# Patient Record
Sex: Female | Born: 1965 | Race: White | Hispanic: No | Marital: Married | Smoking: Never smoker
Health system: Southern US, Community
[De-identification: ages and names within clinical notes are randomized; demographics above are authoritative.]

## PROBLEM LIST (undated history)

## (undated) DIAGNOSIS — R1013 Epigastric pain: Secondary | ICD-10-CM

## (undated) DIAGNOSIS — I10 Essential (primary) hypertension: Secondary | ICD-10-CM

## (undated) DIAGNOSIS — F411 Generalized anxiety disorder: Secondary | ICD-10-CM

## (undated) DIAGNOSIS — K219 Gastro-esophageal reflux disease without esophagitis: Secondary | ICD-10-CM

## (undated) DIAGNOSIS — R0789 Other chest pain: Secondary | ICD-10-CM

## (undated) HISTORY — DX: Generalized anxiety disorder: F41.1

## (undated) HISTORY — PX: ESOPHAGOGASTRODUODENOSCOPY: SHX1529

## (undated) HISTORY — PX: ABDOMINAL HYSTERECTOMY: SHX81

---

## 2005-05-16 HISTORY — PX: NISSEN FUNDOPLICATION: SHX2091

## 2007-09-23 DIAGNOSIS — K219 Gastro-esophageal reflux disease without esophagitis: Secondary | ICD-10-CM | POA: Insufficient documentation

## 2009-09-24 ENCOUNTER — Ambulatory Visit: Payer: Self-pay | Admitting: Family Medicine

## 2010-04-28 ENCOUNTER — Ambulatory Visit: Payer: Self-pay | Admitting: Family Medicine

## 2013-01-29 LAB — BASIC METABOLIC PANEL
BUN: 17 mg/dL (ref 4–21)
Creatinine: 0.8 mg/dL (ref 0.5–1.1)
Glucose: 84 mg/dL
POTASSIUM: 4.6 mmol/L (ref 3.4–5.3)
Sodium: 143 mmol/L (ref 137–147)

## 2013-01-29 LAB — TSH: TSH: 7.05 u[IU]/mL — AB (ref 0.41–5.90)

## 2013-01-29 LAB — CBC AND DIFFERENTIAL
HEMATOCRIT: 42 % (ref 36–46)
HEMOGLOBIN: 13.8 g/dL (ref 12.0–16.0)
NEUTROS ABS: 2 /uL
PLATELETS: 236 10*3/uL (ref 150–399)
WBC: 4.9 10*3/mL

## 2013-01-29 LAB — LIPID PANEL
CHOLESTEROL: 161 mg/dL (ref 0–200)
HDL: 64 mg/dL (ref 35–70)
LDL Cholesterol: 85 mg/dL
LDl/HDL Ratio: 1.3
TRIGLYCERIDES: 62 mg/dL (ref 40–160)

## 2013-01-29 LAB — HEPATIC FUNCTION PANEL
ALK PHOS: 93 U/L (ref 25–125)
ALT: 51 U/L — AB (ref 7–35)
AST: 38 U/L — AB (ref 13–35)
Bilirubin, Total: 0.5 mg/dL

## 2014-12-29 ENCOUNTER — Telehealth: Payer: Self-pay

## 2014-12-29 NOTE — Telephone Encounter (Signed)
Patient reports that her BP was at 127/93 yesterday, pt checked at [facility] while shopping. Patient reports that last week she had been having headaches and felt tired all week. Patient reports that today she is feeling better and does not need to be seen today. Patient denies shortness or breath, swelling. I advised pt to continue monitoring BP to try to get a baseline reading, advised to check in the morning. I also advised pt to keep well hydrated and low salt diet. I advised pt to call if BP continues to run high or if symptoms worsen. Patient voiced understanding.

## 2015-03-20 ENCOUNTER — Encounter: Payer: Self-pay | Admitting: Family Medicine

## 2015-03-20 ENCOUNTER — Ambulatory Visit (INDEPENDENT_AMBULATORY_CARE_PROVIDER_SITE_OTHER): Payer: BC Managed Care – PPO | Admitting: Family Medicine

## 2015-03-20 VITALS — BP 162/98 | HR 66 | Temp 97.9°F | Resp 14 | Ht 64.0 in | Wt 138.0 lb

## 2015-03-20 DIAGNOSIS — J069 Acute upper respiratory infection, unspecified: Secondary | ICD-10-CM | POA: Diagnosis not present

## 2015-03-20 DIAGNOSIS — I10 Essential (primary) hypertension: Secondary | ICD-10-CM

## 2015-03-20 DIAGNOSIS — K449 Diaphragmatic hernia without obstruction or gangrene: Secondary | ICD-10-CM | POA: Insufficient documentation

## 2015-03-20 DIAGNOSIS — R748 Abnormal levels of other serum enzymes: Secondary | ICD-10-CM | POA: Insufficient documentation

## 2015-03-20 DIAGNOSIS — F411 Generalized anxiety disorder: Secondary | ICD-10-CM | POA: Insufficient documentation

## 2015-03-20 DIAGNOSIS — J3089 Other allergic rhinitis: Secondary | ICD-10-CM | POA: Diagnosis not present

## 2015-03-20 DIAGNOSIS — J309 Allergic rhinitis, unspecified: Secondary | ICD-10-CM | POA: Insufficient documentation

## 2015-03-20 DIAGNOSIS — B001 Herpesviral vesicular dermatitis: Secondary | ICD-10-CM | POA: Insufficient documentation

## 2015-03-20 DIAGNOSIS — J0100 Acute maxillary sinusitis, unspecified: Secondary | ICD-10-CM

## 2015-03-20 DIAGNOSIS — F419 Anxiety disorder, unspecified: Secondary | ICD-10-CM | POA: Insufficient documentation

## 2015-03-20 DIAGNOSIS — E038 Other specified hypothyroidism: Secondary | ICD-10-CM | POA: Diagnosis not present

## 2015-03-20 DIAGNOSIS — E039 Hypothyroidism, unspecified: Secondary | ICD-10-CM

## 2015-03-20 MED ORDER — AZITHROMYCIN 250 MG PO TABS: ORAL_TABLET | ORAL | Status: DC

## 2015-03-20 MED ORDER — HYDROCHLOROTHIAZIDE 12.5 MG PO CAPS: 12.5000 mg | ORAL_CAPSULE | Freq: Every day | ORAL | Status: DC

## 2015-03-20 MED ORDER — FLUTICASONE PROPIONATE 50 MCG/ACT NA SUSP: 2.0000 | Freq: Every day | NASAL | Status: DC

## 2015-03-20 NOTE — Progress Notes (Signed)
Subjective:     Patient ID: Tami Williams, female   DOB: [birth year], 49 y.o.   MRN: [id number]  URI  This is a recurrent problem. The current episode started in the past 7 days (5 days ago). The problem has been gradually worsening. There has been no fever. Associated symptoms include chest pain (left, pt thinks may be due to reflux, not associated with cough), congestion, coughing (thick phlegm, did not see it, but nonbloody), headaches, nausea, a plugged ear sensation, rhinorrhea (yellow drainage), sinus pain (maxillary, frontal T zone) and sneezing. Pertinent negatives include no diarrhea, dysuria, ear pain (ears feel "stopped up", no pain), joint pain, rash, sore throat (trouble swallowing feels stuck, no pain), swollen glands, vomiting or wheezing. She has tried decongestant (Flonase all week helped runny nose. Tried Dayquil yesterday but stopped because felt it increases BP.) for the symptoms. The treatment provided moderate relief.   Pt has sinus infections every year around this time. In the past given cough meds, Flonase, Z-Pak which resolve sx in the past.   Also, has not been here in a while secondary to loss of insurance. Thinks her blood pressure has been running up.   Was previously on medication before weight loss.    Patient Active Problem List   Diagnosis Date Noted  . Upper respiratory infection 03/20/2015  No family history on file. Social History   Social History  . Marital Status: Married    Spouse Name: N/A  . Number of Children: N/A  . Years of Education: N/A   Occupational History  . Not on file.   Social History Main Topics  . Smoking status: Not on file  . Smokeless tobacco: Not on file  . Alcohol Use: Not on file  . Drug Use: Not on file  . Sexual Activity: Not on file   Other Topics Concern  . Not on file   Social History Narrative  . No narrative on file   No past surgical history on file. No Known Allergies Previous Medications   FLUTICASONE  (FLONASE) 50 MCG/ACT NASAL SPRAY    Place 2 sprays into both nostrils daily.   BP 162/98 mmHg  Pulse 66  Temp(Src) 97.9 F (36.6 C)  Resp 14  Ht 5\' 4"  (1.626 m)  Wt 138 lb (62.596 kg)  BMI 23.68 kg/m2  SpO2 97%   Review of Systems  Constitutional: Negative for fever, chills, activity change and appetite change.  HENT: Positive for congestion, rhinorrhea (yellow drainage), sinus pressure, sneezing and trouble swallowing. Negative for ear discharge, ear pain (ears feel "stopped up", no pain) and sore throat (trouble swallowing feels stuck, no pain).   Eyes: Positive for discharge and itching.  Respiratory: Positive for cough (thick phlegm, did not see it, but nonbloody). Negative for chest tightness, shortness of breath and wheezing.   Cardiovascular: Positive for chest pain (left, pt thinks may be due to reflux, not associated with cough).  Gastrointestinal: Positive for nausea. Negative for vomiting, diarrhea and constipation.  Endocrine: Negative.   Genitourinary: Negative.  Negative for dysuria.  Musculoskeletal: Negative.  Negative for myalgias and joint pain.  Skin: Negative for rash.  Neurological: Positive for headaches. Negative for weakness.  Hematological: Negative for adenopathy.  Psychiatric/Behavioral: Negative.        Objective:   Physical Exam  Constitutional: She appears well-developed and well-nourished. No distress.  HENT:  Head: Normocephalic and atraumatic.  Right Ear: External ear normal.  Left Ear: External ear normal.  Mouth/Throat: No oropharyngeal  exudate.  Normal TM bilaterally, mild nasal turbinate swelling, no maxillary or frontal sinus pain with palpation  Eyes: Conjunctivae and EOM are normal. Right eye exhibits no discharge. Left eye exhibits no discharge.  Neck: Normal range of motion. Neck supple.  Cardiovascular: Normal rate, regular rhythm and normal heart sounds.  Exam reveals no gallop and no friction rub.   No murmur  heard. Pulmonary/Chest: Effort normal and breath sounds normal. She has no wheezes.  Abdominal: Soft. Bowel sounds are normal. She exhibits no distension and no mass. There is no tenderness. There is no rebound and no guarding.  Musculoskeletal: Normal range of motion. She exhibits no edema or tenderness.  Lymphadenopathy:    She has no cervical adenopathy.  Neurological: She is alert. No cranial nerve deficit. She exhibits normal muscle tone.  Skin: Skin is warm and dry. She is not diaphoretic.  Psychiatric: She has a normal mood and affect.   BP 162/98 mmHg  Pulse 66  Temp(Src) 97.9 F (36.6 C)  Resp 14  Ht  (1.626 m)  Wt 138 lb (62.596 kg)  BMI 23.68 kg/m2  SpO2 97%     Assessment:     As below.     Plan:     1. Upper respiratory infection Initial cause of congestion.     2. Subclinical hypothyroidism Recheck labs.  - TSH  3. Abnormal liver enzymes Recheck  Labs. Has gained some weight since last visit.   - Comprehensive metabolic panel  4. Essential hypertension Recurrent problem.  Check labs and restart medication. Decrease salt and work on diet and exercise for weight loss.  - CBC with Differential/Platelet - hydrochlorothiazide (MICROZIDE) 12.5 MG capsule; Take 1 capsule (12.5 mg total) by mouth daily.  Dispense: 30 capsule; Refill: 5  5. Acute maxillary sinusitis, recurrence not specified New problem.  Start medication.  - azithromycin (ZITHROMAX) 250 MG tablet; 2 today and then one a day for 4 more days.  Dispense: 6 tablet; Refill: 0  6. Other allergic rhinitis Condition is worsening. Will start medication for better control.   - fluticasone (FLONASE) 50 MCG/ACT nasal spray; Place 2 sprays into both nostrils daily.  Dispense: 16 g; Refill: 11   Lorie Phenix, MD

## 2015-03-21 ENCOUNTER — Encounter: Payer: Self-pay | Admitting: Family Medicine

## 2015-03-21 LAB — COMPREHENSIVE METABOLIC PANEL
ALBUMIN: 4.8 g/dL (ref 3.5–5.5)
ALT: 84 IU/L — ABNORMAL HIGH (ref 0–32)
AST: 61 IU/L — ABNORMAL HIGH (ref 0–40)
Albumin/Globulin Ratio: 1.8 (ref 1.1–2.5)
Alkaline Phosphatase: 113 IU/L (ref 39–117)
BUN / CREAT RATIO: 15 (ref 9–23)
BUN: 12 mg/dL (ref 6–24)
Bilirubin Total: 0.3 mg/dL (ref 0.0–1.2)
CO2: 25 mmol/L (ref 18–29)
CREATININE: 0.8 mg/dL (ref 0.57–1.00)
Calcium: 9.9 mg/dL (ref 8.7–10.2)
Chloride: 100 mmol/L (ref 97–106)
GFR calc non Af Amer: 88 mL/min/{1.73_m2} (ref >59)
GFR, EST AFRICAN AMERICAN: 102 mL/min/{1.73_m2} (ref >59)
GLUCOSE: 85 mg/dL (ref 65–99)
Globulin, Total: 2.6 g/dL (ref 1.5–4.5)
Potassium: 4.7 mmol/L (ref 3.5–5.2)
Sodium: 142 mmol/L (ref 136–144)
TOTAL PROTEIN: 7.4 g/dL (ref 6.0–8.5)

## 2015-03-21 LAB — CBC WITH DIFFERENTIAL/PLATELET
BASOS ABS: 0 10*3/uL (ref 0.0–0.2)
Basos: 0 %
EOS (ABSOLUTE): 0.3 10*3/uL (ref 0.0–0.4)
Eos: 3 %
HEMOGLOBIN: 14.3 g/dL (ref 11.1–15.9)
Hematocrit: 43 % (ref 34.0–46.6)
IMMATURE GRANS (ABS): 0 10*3/uL (ref 0.0–0.1)
Immature Granulocytes: 0 %
LYMPHS: 28 %
Lymphocytes Absolute: 2.4 10*3/uL (ref 0.7–3.1)
MCH: 31.3 pg (ref 26.6–33.0)
MCHC: 33.3 g/dL (ref 31.5–35.7)
MCV: 94 fL (ref 79–97)
MONOCYTES: 9 %
Monocytes Absolute: 0.7 10*3/uL (ref 0.1–0.9)
Neutrophils Absolute: 5.1 10*3/uL (ref 1.4–7.0)
Neutrophils: 60 %
Platelets: 274 10*3/uL (ref 150–379)
RBC: 4.57 x10E6/uL (ref 3.77–5.28)
RDW: 12.9 % (ref 12.3–15.4)
WBC: 8.6 10*3/uL (ref 3.4–10.8)

## 2015-03-21 LAB — TSH: TSH: 7.2 u[IU]/mL — AB (ref 0.450–4.500)

## 2015-03-23 ENCOUNTER — Telehealth: Payer: Self-pay

## 2015-03-23 DIAGNOSIS — R748 Abnormal levels of other serum enzymes: Secondary | ICD-10-CM

## 2015-03-23 NOTE — Telephone Encounter (Signed)
-----   Message from Lorie PhenixNancy Maloney, MD sent at 03/21/2015 10:22 AM EDT ----- Labs ok except liver enzymes are elevated. Suspect related to weight gain. Please clarify if patient has seen Gi for this. Did not see it in the records. IF not, needs GI referral. Also,  Thyroid still borderline low.  Thanks- Harriett SineNancy

## 2015-03-23 NOTE — Telephone Encounter (Signed)
Advised pt of lab results. Pt verbally acknowledges understanding. Has not seen GI, [facility] Pt reports she is experiencing anxiety/ palpitations, fatigue, and cold intolerance. Is thyroid low enough to start medication? Allene DillonEmily Drozdowski, CMA

## 2015-03-24 NOTE — Telephone Encounter (Signed)
Would refer to GI for elevated liver enzymes first and recheck TSH and T4 in 4 weeks. Please notify patient and put in order. Thanks.

## 2015-03-24 NOTE — Telephone Encounter (Signed)
Pt advised.  She already has a GI appointment set up for December 28th.  She will call back in the beginning for December to get a lab sheet.    Thanks,   -Vernona RiegerLaura

## 2015-03-27 ENCOUNTER — Telehealth: Payer: Self-pay | Admitting: Family Medicine

## 2015-03-27 DIAGNOSIS — E038 Other specified hypothyroidism: Secondary | ICD-10-CM

## 2015-03-27 DIAGNOSIS — E039 Hypothyroidism, unspecified: Secondary | ICD-10-CM

## 2015-03-27 NOTE — Telephone Encounter (Signed)
Ok to refer.

## 2015-03-27 NOTE — Telephone Encounter (Signed)
Pt is [facility] to see a specalist about her thyroid.  Fax #[phone].  CB#[phone]

## 2015-03-27 NOTE — Telephone Encounter (Signed)
Please clarify who this means. Thanks.

## 2015-03-27 NOTE — Telephone Encounter (Signed)
Pt would like to see Dr. Sharin GraveSam Morayati (Endocrinology) for her thyroid.  She states she could not make an appointment without a referral.   Thanks,   -Vernona RiegerLaura

## 2015-05-01 ENCOUNTER — Encounter: Payer: Self-pay | Admitting: Family Medicine

## 2015-05-15 ENCOUNTER — Encounter: Payer: Self-pay | Admitting: Family Medicine

## 2015-05-27 ENCOUNTER — Encounter: Payer: Self-pay | Admitting: Family Medicine

## 2015-06-02 ENCOUNTER — Encounter: Payer: Self-pay | Admitting: Family Medicine

## 2015-08-11 ENCOUNTER — Encounter: Payer: BC Managed Care – PPO | Admitting: Family Medicine

## 2015-08-13 ENCOUNTER — Encounter: Payer: Self-pay | Admitting: Physician Assistant

## 2015-08-13 ENCOUNTER — Ambulatory Visit (INDEPENDENT_AMBULATORY_CARE_PROVIDER_SITE_OTHER): Payer: BC Managed Care – PPO | Admitting: Physician Assistant

## 2015-08-13 VITALS — BP 120/90 | HR 68 | Temp 98.0°F | Resp 16 | Wt 128.2 lb

## 2015-08-13 DIAGNOSIS — N751 Abscess of Bartholin's gland: Secondary | ICD-10-CM

## 2015-08-13 MED ORDER — CEPHALEXIN 500 MG PO CAPS: 500.0000 mg | ORAL_CAPSULE | Freq: Two times a day (BID) | ORAL | Status: DC

## 2015-08-13 NOTE — Patient Instructions (Signed)
Bartholin Cyst or Abscess A Bartholin cyst is a fluid-filled sac that forms on a Bartholin gland. Bartholin glands are small glands that are located within the folds of skin (labia) along the sides of the lower opening of the vagina. These glands produce a fluid to moisten the outside of the vagina during sexual intercourse. A Bartholin cyst causes a bulge on the side of the vagina. A cyst that is not large or infected may not cause symptoms or problems. However, if the fluid within the cyst becomes infected, the cyst can turn into an abscess. An abscess may cause discomfort or pain. CAUSES A Bartholin cyst may develop when the duct of the gland becomes blocked. In many cases, the cause of this is not known. Various kinds of bacteria can cause the cyst to become infected and develop into an abscess. RISK FACTORS You may be at an increased risk of developing a Bartholin cyst or abscess if:  You are a woman of reproductive age.  You have a history of previous Bartholin cysts or abscesses.  You have diabetes.  You have a sexually transmitted disease (STD). SIGNS AND SYMPTOMS The severity of symptoms varies depending on the size of the cyst and whether it is infected. Symptoms may include:  A bulge or swelling near the lower opening of your vagina.  Discomfort or pain.  Redness.  Pain during sexual intercourse.  Pain when walking.  Fluid draining from the area. DIAGNOSIS Your health care provider may make a diagnosis based on your symptoms and a physical exam. He or she will look for swelling in your vaginal area. Blood tests may be done to check for infections. A sample of fluid from the cyst or abscess may also be taken to be tested in a lab. TREATMENT Small cysts that are not infected may not require any treatment. These often go away on their own. Yourhealth care provider will recommend hot baths and the use of warm compresses. These may also be part of the treatment for an abscess.  Treatment options for a large cyst or abscess may include:   Antibiotic medicine.  A surgical procedure to drain the abscess. One of the following procedures may be done:  Incision and drainage. An incision is made in the cyst or abscess so that the fluid drains out. A catheter may be placed inside the cyst so that it does not close and fill up with fluid again. The catheter will be removed after you have a follow-up visit with a specialist (gynecologist).  Marsupialization. The cyst or abscess is opened and kept open by stitching the edges of the skin to the walls of the cyst or abscess. This allows it to continue to drain and not fill up with fluid again. If you have cysts or abscesses that keep returning and have required incision and drainage multiple times, your health care provider may talk to you about surgery to remove the Bartholin gland. HOME CARE INSTRUCTIONS  Take medicines only as directed by your health care provider.  If you were prescribed an antibiotic medicine, finish it all even if you start to feel better.  Apply warm, wet compresses to the area or take warm, shallow baths that cover your pelvic region (sitz baths) several times a day or as directed by your health care provider.  Do not squeeze the cyst or apply heavy pressure to it.  Do not have sexual intercourse until the cyst has gone away.  If your cyst or abscess was   opened, a small piece of gauze or a drain may have been placed in the area to allow drainage. Do not remove the gauze or the drain until directed by your health care provider.  Wear feminine pads--not tampons--as needed for any drainage or bleeding.  Keep all follow-up visits as directed by your health care provider. This is important. PREVENTION Take these steps to help prevent a Bartholin cyst from returning:  Practice good hygiene.   Clean your vaginal area with mild soap and a soft cloth when you bathe.  Practice safe sex to prevent  STDs. SEEK MEDICAL CARE IF:  You have increased pain, swelling, or redness in the area of the cyst.  Puslike drainage is coming from the cyst.  You have a fever.   This information is not intended to replace advice given to you by your health care provider. Make sure you discuss any questions you have with your health care provider.   Document Released: 05/02/2005 Document Revised: 05/23/2014 Document Reviewed: 12/16/2013 Elsevier Interactive Patient Education 2016 Elsevier Inc.  

## 2015-08-13 NOTE — Progress Notes (Signed)
       Patient: Tami CopierLisa P Williams Female    DOB: 03/26/1966   50 y.o.   MRN: [id number] Visit Date: 08/13/2015  Today's Provider: Margaretann LovelessJennifer M Burnette, PA-C   Chief Complaint  Patient presents with  . bump on vaginal area   Subjective:    HPI Tami Williams is here today c/o a little bump on her vaginal area. She notice the bump on Sunday. She reports that if she touches it , it hurts. No discharge, no fever. She is sexually active but has been with only one partner since she was 50 years old. She states there is no infidelity.    No Known Allergies Previous Medications   AZITHROMYCIN (ZITHROMAX) 250 MG TABLET    2 today and then one a day for 4 more days.   FLUTICASONE (FLONASE) 50 MCG/ACT NASAL SPRAY    Place 2 sprays into both nostrils daily.   HYDROCHLOROTHIAZIDE (MICROZIDE) 12.5 MG CAPSULE    Take 1 capsule (12.5 mg total) by mouth daily.   SYNTHROID 25 MCG TABLET    TK 1 T PO QD    Review of Systems  Constitutional: Negative for fever and fatigue.  HENT: Negative.   Respiratory: Negative for cough, chest tightness, shortness of breath and wheezing.   Cardiovascular: Negative for chest pain, palpitations and leg swelling.  Genitourinary: Positive for genital sores. Negative for dysuria, urgency, frequency, flank pain, vaginal bleeding, vaginal discharge, vaginal pain, menstrual problem, pelvic pain and dyspareunia.  Musculoskeletal: Negative for back pain.    Social History  Substance Use Topics  . Smoking status: Never Smoker   . Smokeless tobacco: Not on file  . Alcohol Use: 0.0 oz/week    0 Standard drinks or equivalent per week     Comment: occasional   Objective:   BP 120/90 mmHg  Pulse 68  Temp(Src) 98 F (36.7 C) (Oral)  Resp 16  Wt 128 lb 3.2 oz (58.151 kg)  Physical Exam  Constitutional: She appears well-developed and well-nourished. No distress.  Cardiovascular: Normal rate, regular rhythm and normal heart sounds.  Exam reveals no gallop and no friction  rub.   No murmur heard. Pulmonary/Chest: Effort normal and breath sounds normal. No respiratory distress. She has no wheezes. She has no rales.  Genitourinary: Vagina normal.    There is no rash, tenderness, lesion or injury on the right labia. There is tenderness and lesion on the left labia. There is no rash or injury on the left labia.  Skin: She is not diaphoretic.  Vitals reviewed.       Assessment & Plan:     1. Bartholin's gland abscess Will treat with Keflex as below. Advised to use warm compresses over the area for discomfort. May also soak in warm bathtub with Epson salt for inflammation and discomfort. She is to call the office if symptoms do not improve or worsen. - cephALEXin (KEFLEX) 500 MG capsule; Take 1 capsule (500 mg total) by mouth 2 (two) times daily.  Dispense: 14 capsule; Refill: 0       Margaretann LovelessJennifer M Burnette, PA-C  [facility]

## 2015-09-09 ENCOUNTER — Ambulatory Visit (INDEPENDENT_AMBULATORY_CARE_PROVIDER_SITE_OTHER): Payer: BC Managed Care – PPO | Admitting: Family Medicine

## 2015-09-09 ENCOUNTER — Encounter: Payer: Self-pay | Admitting: Family Medicine

## 2015-09-09 VITALS — BP 110/80 | HR 76 | Temp 98.1°F | Resp 16 | Ht 64.0 in | Wt 127.0 lb

## 2015-09-09 DIAGNOSIS — Z1211 Encounter for screening for malignant neoplasm of colon: Secondary | ICD-10-CM

## 2015-09-09 DIAGNOSIS — F419 Anxiety disorder, unspecified: Secondary | ICD-10-CM

## 2015-09-09 DIAGNOSIS — I10 Essential (primary) hypertension: Secondary | ICD-10-CM

## 2015-09-09 DIAGNOSIS — J3089 Other allergic rhinitis: Secondary | ICD-10-CM

## 2015-09-09 DIAGNOSIS — Z1239 Encounter for other screening for malignant neoplasm of breast: Secondary | ICD-10-CM | POA: Diagnosis not present

## 2015-09-09 DIAGNOSIS — Z0000 Encounter for general adult medical examination without abnormal findings: Secondary | ICD-10-CM | POA: Diagnosis not present

## 2015-09-09 DIAGNOSIS — E038 Other specified hypothyroidism: Secondary | ICD-10-CM

## 2015-09-09 DIAGNOSIS — E039 Hypothyroidism, unspecified: Secondary | ICD-10-CM

## 2015-09-09 LAB — POCT URINALYSIS DIPSTICK
BILIRUBIN UA: NEGATIVE
Glucose, UA: NEGATIVE
KETONES UA: NEGATIVE
Leukocytes, UA: NEGATIVE
Nitrite, UA: NEGATIVE
PH UA: 6
Protein, UA: NEGATIVE
RBC UA: NEGATIVE
Spec Grav, UA: <=1.005
Urobilinogen, UA: 0.2

## 2015-09-09 LAB — IFOBT (OCCULT BLOOD): IFOBT: NEGATIVE

## 2015-09-09 MED ORDER — ESCITALOPRAM OXALATE 10 MG PO TABS: 10.0000 mg | ORAL_TABLET | Freq: Every day | ORAL | Status: DC

## 2015-09-09 NOTE — Progress Notes (Signed)
Patient ID: AYLEE LITTRELL, female   DOB: Oct 23, 1965, 50 y.o.   MRN: [id number]       Patient: Tami Williams, Female    DOB: 01-29-1966, 50 y.o.   MRN: [id number] Visit Date: 09/09/2015  Today's Provider: Lorie Phenix, MD   Chief Complaint  Patient presents with  . Annual Exam  . Anxiety  . URI   Subjective:    Annual physical exam Tami Williams is a 50 y.o. female who presents today for health maintenance and complete physical. She feels well. She reports exercising 6 days a week. She reports she is sleeping fairly well.  01/04/13 CPE 01/04/13 Pap-neg; HPV-neg ----------------------------------------------------------------- Anxiety: Patient complains of anxiety disorder.  She has the following symptoms: irritable, mood swings. Onset of symptoms was approximately several months ago, gradually worsening since that time. She denies current suicidal and homicidal ideation. Family history significant for depression.Possible organic causes contributing are: none. Risk factors: none Previous treatment includes none and none.  She complains of the following side effects from the treatment: none.  Upper Respiratory Infection: Patient complains of symptoms of a URI, possible sinusitis. Symptoms include congestion, cough and sore throat. Onset of symptoms was 1 week ago, unchanged since that time. She also c/o productive cough with  yellow colored sputum for the past 1 week .  She is drinking plenty of fluids. Evaluation to date: none. Treatment to date: decongestants.   Review of Systems  Constitutional: Positive for activity change.  HENT: Positive for congestion, rhinorrhea, sinus pressure, sneezing and trouble swallowing.   Eyes: Negative.   Respiratory: Positive for cough.   Cardiovascular: Negative.   Gastrointestinal: Negative.   Endocrine: Negative.   Genitourinary: Negative.   Musculoskeletal: Negative.   Skin: Negative.   Allergic/Immunologic: Negative.   Neurological:  Negative.   Hematological: Negative.   Psychiatric/Behavioral: Positive for sleep disturbance and agitation. The patient is nervous/anxious.     Social History      She  reports that she has never smoked. She has never used smokeless tobacco. She reports that she drinks alcohol. She reports that she does not use illicit drugs.       Social History   Social History  . Marital Status: Married    Spouse Name: N/A  . Number of Children: N/A  . Years of Education: N/A   Social History Main Topics  . Smoking status: Never Smoker   . Smokeless tobacco: Never Used  . Alcohol Use: 0.0 oz/week    0 Standard drinks or equivalent per week     Comment: occasional  . Drug Use: No  . Sexual Activity: Not Asked   Other Topics Concern  . None   Social History Narrative    History reviewed. No pertinent past medical history.   Patient Active Problem List   Diagnosis Date Noted  . Upper respiratory infection 03/20/2015  . Allergic rhinitis 03/20/2015  . Anxiety 03/20/2015  . Cold sore 03/20/2015  . Abnormal liver enzymes 03/20/2015  . 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 Hypertension 03/20/2015  . Diaphragmatic hernia 03/20/2015  . 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 Acid reflux 09/23/2007  . Gastro-esophageal reflux disease without esophagitis 09/23/2007    Past Surgical History  Procedure Laterality Date  . Nissen fundoplication  2007    dr. Katrinka Blazing  . Abdominal hysterectomy      due to endometriosis    Family History        Family Status  Relation  Status Death Age  . Mother Alive   . Father Alive         Her family history includes Hypertension in her father and mother.    No Known Allergies  Previous Medications   SYNTHROID 25 MCG TABLET    TK 1 T PO QD    Patient Care Team: Lorie PhenixNancy Jaidyn Kuhl, MD as PCP - General (Family Medicine)     Objective:   Vitals: BP 110/80 mmHg  Pulse 76  Temp(Src) 98.1 F (36.7 C)  (Oral)  Resp 16  Ht 5\' 4"  (1.626 m)  Wt 127 lb (57.607 kg)  BMI 21.79 kg/m2   Physical Exam  Constitutional: She is oriented to person, place, and time. She appears well-developed and well-nourished.  HENT:  Head: Normocephalic and atraumatic.  Right Ear: Tympanic membrane, external ear and ear canal normal.  Left Ear: Tympanic membrane, external ear and ear canal normal.  Nose: Nose normal.  Mouth/Throat: Uvula is midline, oropharynx is clear and moist and mucous membranes are normal.  Eyes: Conjunctivae, EOM and lids are normal. Pupils are equal, round, and reactive to light.  Neck: Trachea normal and normal range of motion. Neck supple. Carotid bruit is not present. No thyroid mass and no thyromegaly present.  Cardiovascular: Normal rate, regular rhythm and normal heart sounds.   Pulmonary/Chest: Effort normal and breath sounds normal.  Abdominal: Soft. Normal appearance and bowel sounds are normal. There is no hepatosplenomegaly. There is no tenderness.  Genitourinary: No breast swelling, tenderness or discharge.  Musculoskeletal: Normal range of motion.  Lymphadenopathy:    She has no cervical adenopathy.    She has no axillary adenopathy.  Neurological: She is alert and oriented to person, place, and time. She has normal strength. No cranial nerve deficit.  Skin: Skin is warm, dry and intact.  Psychiatric: She has a normal mood and affect. Her speech is normal and behavior is normal. Judgment and thought content normal. Cognition and memory are normal.    Depression Screen PHQ 2/9 Scores 09/09/2015  PHQ - 2 Score 0     Assessment & Plan:     Routine Health Maintenance and Physical Exam  Exercise Activities and Dietary recommendations Goals    None       There is no immunization history on file for this patient.      1. Annual physical exam Stable. Patient advised to continue eating healthy and exercise daily. - POCT urinalysis dipstick  2. Anxiety New  problem. Worsening. Patient started on escitalopram 10 mg as below.  Patient instructed to call back if condition worsens or does not improve.    - escitalopram (LEXAPRO) 10 MG tablet; Take 1 tablet (10 mg total) by mouth daily. Take 1/2 tablet for a week then increase to 1 tablet daily  Dispense: 30 tablet; Refill: 5  3. Breast cancer screening - MM DIGITAL SCREENING BILATERAL; Future  4. Colon cancer screening - IFOBT POC (occult bld, rslt in office)  5. Essential hypertension - Comprehensive metabolic panel - Lipid Panel With LDL/HDL Ratio  6. Other allergic rhinitis - CBC with Differential/Platelet  7. Subclinical hypothyroidism Stable. Patient advised to keep follow-up with endocrinology.   Patient seen and examined by Dr. Leo GrosserNancy J.. Amear Strojny, and note scribed by Liz BeachSulibeya S. Dimas, CMA.  I have reviewed the document for accuracy and completeness and I agree with above. Leo Grosser- Trayce Caravello J. Alayne Estrella, MD   Lorie PhenixNancy Keiry Kowal, MD   --------------------------------------------------------------------

## 2015-09-09 NOTE — Patient Instructions (Signed)
Please call the [facility] at [facility] to schedule this at [phone]   

## 2015-09-10 ENCOUNTER — Telehealth: Payer: Self-pay | Admitting: Family Medicine

## 2015-09-10 DIAGNOSIS — J329 Chronic sinusitis, unspecified: Secondary | ICD-10-CM | POA: Insufficient documentation

## 2015-09-10 DIAGNOSIS — J0100 Acute maxillary sinusitis, unspecified: Secondary | ICD-10-CM

## 2015-09-10 MED ORDER — AMOXICILLIN-POT CLAVULANATE 875-125 MG PO TABS: 1.0000 | ORAL_TABLET | Freq: Two times a day (BID) | ORAL | Status: DC

## 2015-09-10 NOTE — Telephone Encounter (Signed)
I did send in rx today. See below. Please notify patient. Thanks.

## 2015-09-10 NOTE — Telephone Encounter (Signed)
Was on the fence about treatment but send in rx if still with symptoms. Thanks.

## 2015-09-10 NOTE — Telephone Encounter (Signed)
Pt stated when she was in the office for her CPE 09/09/15 she was told that she has a sinus infection. Pt stated that [facility] [place] and she just wanted to make sure it wasn't overlooked or if Dr. Elease HashimotoMaloney just wanted her to try something OTC. Please advise. Thanks TNP

## 2015-09-10 NOTE — Telephone Encounter (Signed)
Informed pt. Tami Williams, CMA  

## 2015-09-16 ENCOUNTER — Telehealth: Payer: Self-pay

## 2015-09-16 LAB — COMPREHENSIVE METABOLIC PANEL
ALBUMIN: 4.6 g/dL (ref 3.5–5.5)
ALT: 22 IU/L (ref 0–32)
AST: 25 IU/L (ref 0–40)
Albumin/Globulin Ratio: 2.1 (ref 1.2–2.2)
Alkaline Phosphatase: 73 IU/L (ref 39–117)
BUN/Creatinine Ratio: 22 (ref 9–23)
BUN: 19 mg/dL (ref 6–24)
Bilirubin Total: 0.5 mg/dL (ref 0.0–1.2)
CALCIUM: 9.5 mg/dL (ref 8.7–10.2)
CO2: 26 mmol/L (ref 18–29)
Chloride: 98 mmol/L (ref 96–106)
Creatinine, Ser: 0.87 mg/dL (ref 0.57–1.00)
GFR, EST AFRICAN AMERICAN: 92 mL/min/{1.73_m2} (ref >59)
GFR, EST NON AFRICAN AMERICAN: 80 mL/min/{1.73_m2} (ref >59)
GLOBULIN, TOTAL: 2.2 g/dL (ref 1.5–4.5)
Glucose: 84 mg/dL (ref 65–99)
Potassium: 5 mmol/L (ref 3.5–5.2)
Sodium: 140 mmol/L (ref 134–144)
TOTAL PROTEIN: 6.8 g/dL (ref 6.0–8.5)

## 2015-09-16 LAB — CBC WITH DIFFERENTIAL/PLATELET
BASOS: 0 %
Basophils Absolute: 0 10*3/uL (ref 0.0–0.2)
EOS (ABSOLUTE): 0.1 10*3/uL (ref 0.0–0.4)
EOS: 2 %
HEMATOCRIT: 42.8 % (ref 34.0–46.6)
HEMOGLOBIN: 14.1 g/dL (ref 11.1–15.9)
IMMATURE GRANS (ABS): 0 10*3/uL (ref 0.0–0.1)
IMMATURE GRANULOCYTES: 0 %
LYMPHS: 35 %
Lymphocytes Absolute: 1.9 10*3/uL (ref 0.7–3.1)
MCH: 30.5 pg (ref 26.6–33.0)
MCHC: 32.9 g/dL (ref 31.5–35.7)
MCV: 93 fL (ref 79–97)
MONOCYTES: 9 %
Monocytes Absolute: 0.5 10*3/uL (ref 0.1–0.9)
NEUTROS PCT: 54 %
Neutrophils Absolute: 2.9 10*3/uL (ref 1.4–7.0)
PLATELETS: 319 10*3/uL (ref 150–379)
RBC: 4.62 x10E6/uL (ref 3.77–5.28)
RDW: 13.1 % (ref 12.3–15.4)
WBC: 5.4 10*3/uL (ref 3.4–10.8)

## 2015-09-16 LAB — LIPID PANEL WITH LDL/HDL RATIO
Cholesterol, Total: 173 mg/dL (ref 100–199)
HDL: 63 mg/dL (ref >39)
LDL Calculated: 89 mg/dL (ref 0–99)
LDL/HDL RATIO: 1.4 ratio (ref 0.0–3.2)
Triglycerides: 103 mg/dL (ref 0–149)
VLDL Cholesterol Cal: 21 mg/dL (ref 5–40)

## 2015-09-16 NOTE — Telephone Encounter (Signed)
Pt advised.   Thanks,   -Laura  

## 2015-09-16 NOTE — Telephone Encounter (Signed)
-----   Message from Lorie PhenixNancy Maloney, MD sent at 09/16/2015  2:01 PM EDT ----- Labs stable Please notify patient.  Liver enzymes normal and cholesterol good at 173. Thanks.

## 2015-09-21 ENCOUNTER — Telehealth: Payer: Self-pay | Admitting: Family Medicine

## 2015-09-21 NOTE — Telephone Encounter (Signed)
Pt stated that the escitalopram (LEXAPRO) 10 MG tablet has really helped and she can tell a big difference since starting the medication. Pt stated that she started taking it on 09/09/15 and she usually takes it daily after breakfast between 7 & 8 am. Pt stated that she has noticed that she feels sleepy around 9 am and she pushes past the feeling and then she feels that way again around 6 pm. 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 Pt stated that she isn't so tired that she has to lay down. Pt wanted to know if this might be something that will wear off once her system gets adjusted to the medication or if she can expect to continue feeling tired. Pt stated that she drank a " Monster" energy drink 3 times last week around 10-10:30ish and it helped her to not feel sleepy. Please advise. Thanks TNP

## 2015-09-21 NOTE — Telephone Encounter (Signed)
Pt advised as directed below.  She says she is going to take it at night.   Thanks,   -Vernona RiegerLaura

## 2015-09-21 NOTE — Telephone Encounter (Signed)
Can change to taking it at night and she may not be as sleepy in the day.  Also, should get better with time.  Thanks.

## 2015-10-07 ENCOUNTER — Telehealth: Payer: Self-pay | Admitting: Family Medicine

## 2015-10-07 DIAGNOSIS — F419 Anxiety disorder, unspecified: Secondary | ICD-10-CM

## 2015-10-07 MED ORDER — ESCITALOPRAM OXALATE 10 MG PO TABS: 5.0000 mg | ORAL_TABLET | Freq: Every day | ORAL | Status: DC

## 2015-10-07 NOTE — Telephone Encounter (Signed)
Can try 5 mg or switch to Sertraline and see if less sedating. Thanks.

## 2015-10-07 NOTE — Telephone Encounter (Signed)
Informed pt as below. Is going to try 5 mg Lexapro qhs, and will call if anxiety worsens or drowsiness does not improve. Changed in chart. Allene DillonEmily Drozdowski, CMA

## 2015-10-07 NOTE — Telephone Encounter (Signed)
Pt is taking Lexapro.  She switched to taking at night because of the sleepiness.  She said she is still feeling sleepy during the day.  Is there anything else she can try as far as timing the medication.   She said the medication is helping with anxiety,  Pt's call back is [phone]  Thanks. teri.  ,

## 2015-10-09 ENCOUNTER — Ambulatory Visit: Payer: BC Managed Care – PPO | Admitting: Family Medicine

## 2015-10-23 ENCOUNTER — Ambulatory Visit (INDEPENDENT_AMBULATORY_CARE_PROVIDER_SITE_OTHER): Payer: BC Managed Care – PPO | Admitting: Family Medicine

## 2015-10-23 ENCOUNTER — Encounter: Payer: Self-pay | Admitting: Family Medicine

## 2015-10-23 VITALS — BP 126/78 | HR 60 | Temp 98.0°F | Resp 16 | Wt 131.0 lb

## 2015-10-23 DIAGNOSIS — F419 Anxiety disorder, unspecified: Secondary | ICD-10-CM

## 2015-10-23 NOTE — Progress Notes (Signed)
Patient ID: Tami Williams, female   DOB: [birth year], 50 y.o.   MRN: [id number]      Patient: Tami Williams Female    DOB: [birth year]   50 y.o.   MRN: [id number] Visit Date: 10/23/2015  Today's Provider: Lorie PhenixNancy Tyniah Kastens, MD   Chief Complaint  Patient presents with  . Anxiety   Subjective:    Anxiety Presents for follow-up (Started Lexapro 10mg  09/09/2015) visit. The problem has been gradually improving (Still has some anxiety; but overall feels much better.  She says the medicine makes her sleepy some.  She is worried about the sexual side effects.  ). Symptoms include nervous/anxious behavior (Only slightly; but pt says she is much better. ). Patient reports no chest pain, compulsions, confusion, decreased concentration, depressed mood, dizziness, dry mouth, excessive worry, feeling of choking, insomnia, irritability, malaise, muscle tension, nausea, obsessions, palpitations, panic, restlessness, shortness of breath or suicidal ideas. The quality of sleep is good.   Past treatments include SSRIs. The treatment provided significant relief. Compliance with prior treatments has been good.      No Known Allergies No outpatient prescriptions have been marked as taking for the 10/23/15 encounter (Office Visit) with Tami PhenixNancy Shakeerah Gradel, MD.   Review of Systems  Constitutional: Positive for fatigue (Pt reports Lexapro makes her fatigued.). Negative for fever, chills, diaphoresis, activity change, appetite change, irritability and unexpected weight change.  Respiratory: Negative.  Negative for shortness of breath.   Cardiovascular: Negative.  Negative for chest pain and palpitations.  Gastrointestinal: Negative.  Negative for nausea.  Musculoskeletal: Negative.   Neurological: Negative for dizziness.  Psychiatric/Behavioral: Negative for suicidal ideas, hallucinations, behavioral problems, confusion, sleep disturbance, self-injury, dysphoric mood, decreased concentration and agitation. The patient is  nervous/anxious (Only slightly; but pt says she is much better. ). The patient does not have insomnia and is not hyperactive.    Social History  Substance Use Topics  . Smoking status: Never Smoker   . Smokeless tobacco: Never Used  . Alcohol Use: 0.0 oz/week    0 Standard drinks or equivalent per week     Comment: occasional   Objective:   BP 126/78 mmHg  Pulse 60  Temp(Src) 98 F (36.7 C) (Oral)  Resp 16  Wt 131 lb (59.421 kg)  Physical Exam  Constitutional: She is oriented to person, place, and time. She appears well-developed and well-nourished.  Neurological: She is alert and oriented to person, place, and time.  Skin: Skin is warm and dry.  Psychiatric: She has a normal mood and affect. Her behavior is normal. Judgment and thought content normal.      Assessment & Plan:     1. Anxiety Much improved.  Is having some side effects such as being slightly fatigued and some sexual side effects.  Pt does not want to change for now.  Will follow up with [name] in six weeks.  Patient was seen and examined by Tami GrosserNancy J. Dalyn Kjos, MD, and note scribed by Tami Williams, CMA.   I have reviewed the document for accuracy and completeness and I agree with above. - Tami GrosserNancy J. Amarrion Pastorino, MD     Tami PhenixNancy Hersh Minney, MD  [facility]

## 2015-11-03 ENCOUNTER — Emergency Department: Payer: BC Managed Care – PPO

## 2015-11-03 ENCOUNTER — Ambulatory Visit (INDEPENDENT_AMBULATORY_CARE_PROVIDER_SITE_OTHER): Payer: BC Managed Care – PPO | Admitting: Family Medicine

## 2015-11-03 ENCOUNTER — Emergency Department
Admission: EM | Admit: 2015-11-03 | Discharge: 2015-11-03 | Disposition: A | Discharge status: Home / Self Care | Payer: BC Managed Care – PPO | Attending: Emergency Medicine | Admitting: Emergency Medicine

## 2015-11-03 ENCOUNTER — Other Ambulatory Visit: Payer: Self-pay

## 2015-11-03 ENCOUNTER — Encounter: Payer: Self-pay | Admitting: Emergency Medicine

## 2015-11-03 ENCOUNTER — Encounter: Payer: Self-pay | Admitting: Family Medicine

## 2015-11-03 VITALS — BP 172/112 | HR 60 | Temp 97.9°F | Resp 16 | Wt 130.0 lb

## 2015-11-03 DIAGNOSIS — E039 Hypothyroidism, unspecified: Secondary | ICD-10-CM | POA: Insufficient documentation

## 2015-11-03 DIAGNOSIS — R079 Chest pain, unspecified: Secondary | ICD-10-CM

## 2015-11-03 DIAGNOSIS — I159 Secondary hypertension, unspecified: Secondary | ICD-10-CM | POA: Diagnosis not present

## 2015-11-03 DIAGNOSIS — R0789 Other chest pain: Secondary | ICD-10-CM | POA: Diagnosis present

## 2015-11-03 DIAGNOSIS — K219 Gastro-esophageal reflux disease without esophagitis: Secondary | ICD-10-CM | POA: Diagnosis not present

## 2015-11-03 DIAGNOSIS — R110 Nausea: Secondary | ICD-10-CM

## 2015-11-03 DIAGNOSIS — R1013 Epigastric pain: Secondary | ICD-10-CM

## 2015-11-03 LAB — BASIC METABOLIC PANEL WITH GFR
Anion gap: 8 (ref 5–15)
BUN: 10 mg/dL (ref 6–20)
CO2: 28 mmol/L (ref 22–32)
Calcium: 9.6 mg/dL (ref 8.9–10.3)
Chloride: 101 mmol/L (ref 101–111)
Creatinine, Ser: 0.78 mg/dL (ref 0.44–1.00)
GFR calc Af Amer: >60 mL/min
GFR calc non Af Amer: >60 mL/min
Glucose, Bld: 89 mg/dL (ref 65–99)
Potassium: 4.2 mmol/L (ref 3.5–5.1)
Sodium: 137 mmol/L (ref 135–145)

## 2015-11-03 LAB — TROPONIN I

## 2015-11-03 LAB — CBC
HCT: 40.4 % (ref 35.0–47.0)
Hemoglobin: 13.8 g/dL (ref 12.0–16.0)
MCH: 31.3 pg (ref 26.0–34.0)
MCHC: 34.1 g/dL (ref 32.0–36.0)
MCV: 91.8 fL (ref 80.0–100.0)
Platelets: 222 10*3/uL (ref 150–440)
RBC: 4.41 MIL/uL (ref 3.80–5.20)
RDW: 13.9 % (ref 11.5–14.5)
WBC: 6.6 10*3/uL (ref 3.6–11.0)

## 2015-11-03 MED ORDER — RANITIDINE HCL 150 MG/10ML PO SYRP
150.0000 mg | ORAL_SOLUTION | Freq: Once | ORAL | Status: AC
  Administered 2015-11-03: 150 mg via ORAL
  Filled 2015-11-03: qty 10

## 2015-11-03 MED ORDER — OMEPRAZOLE 40 MG PO CPDR: 40.0000 mg | DELAYED_RELEASE_CAPSULE | Freq: Every day | ORAL | Status: DC

## 2015-11-03 NOTE — ED Notes (Signed)
Called pharmacy to get Zantac syrup.

## 2015-11-03 NOTE — ED Notes (Signed)
Patient presents to the ED with left sided chest pain radiating into left shoulder and back.  Patient went to her PCP and patient's blood pressure was very high so they sent her to the ED.  Patient denies shortness of breath.  Patient denies headache and dizziness.  Patient denies blurry vision.  Patient is in no obvious distress at this time.

## 2015-11-03 NOTE — ED Notes (Signed)
Pt presents to ED from PCP for HTN. Pt BP was in 170s at PCP, reading 180s at triage, reading 190s first reading in room 3. Changed arm, reading 180/113. Pt states L chest pain that began last night that radiates to back and L shoulder. Pt with hx of hiatal hernia and reflux, surgery on that 16 years ago.  Dr. Sharma CovertNorman at bedside.

## 2015-11-03 NOTE — ED Provider Notes (Signed)
[facility] Emergency Department Provider Note  ____________________________________________  Time seen: Approximately 7:59 PM  I have reviewed the triage vital signs and the nursing notes.   HISTORY  Chief Complaint Hypertension and Chest Pain    HPI Tami Williams is a 50 y.o. female with a history of GERD and hiatal hernia s/p Nissen fundoplication 2007 presenting with central chest pain. The patient reports that yesterday she was at rest when she developed of "dull ache" in the center part of her chest and in the upper epigastric area that was constant. She did not have any associated pleuritic pain, palpitations, radiation, diaphoresis, lightheadedness or syncope. She did have nausea without vomiting. The pain persisted this morning when she woke up so she went to her primary care physician's office and found that she was markedly hypertensive and sent her to the emergency department for further evaluation. The patient does not have a history of hypertension. She does have a history of anxiety which is well controlled on Lexapro and does not feel particularly stressed out. She denies any tobacco or cocaine use. She has no family history of early heart disease, personal or family history of blood clots. She has not had any leg swelling, calf pain, and is not on any hormonal birth control.  The patient has never had a stress test for risk stratification.   History reviewed. No pertinent past medical history.  Patient Active Problem List   Diagnosis Date Noted  . Sinusitis 09/10/2015  . Upper respiratory infection 03/20/2015  . Allergic rhinitis 03/20/2015  . Anxiety 03/20/2015  . Cold sore 03/20/2015  . Abnormal liver enzymes 03/20/2015  . 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 Hypertension 03/20/2015  . Diaphragmatic hernia 03/20/2015  . 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 Acid reflux 09/23/2007  .  Gastro-esophageal reflux disease without esophagitis 09/23/2007    Past Surgical History  Procedure Laterality Date  . Nissen fundoplication  2007    dr. Katrinka Blazing  . Abdominal hysterectomy      due to endometriosis    Current Outpatient Rx  Name  Route  Sig  Dispense  Refill  . escitalopram (LEXAPRO) 10 MG tablet   Oral   Take 0.5 tablets (5 mg total) by mouth at bedtime. Take 1/2 tablet for a week then increase to 1 tablet daily   30 tablet   5   . SYNTHROID 25 MCG tablet      TK 1 T PO QD      6     Dispense as written.     Allergies Review of patient's allergies indicates no known allergies.  Family History  Problem Relation Age of Onset  . Hypertension Mother   . Hypertension Father     Social History Social History  Substance Use Topics  . Smoking status: Never Smoker   . Smokeless tobacco: Never Used  . Alcohol Use: 0.0 oz/week    0 Standard drinks or equivalent per week     Comment: occasional    Review of Systems Constitutional: No fever/chills.No lightheadedness or syncope. Eyes: No visual changes. ENT: No sore throat. No congestion or rhinorrhea. Cardiovascular: Positive central chest pain. Denies palpitations. Respiratory: Denies shortness of breath.  No cough. Gastrointestinal: Positive epigastric abdominal pain.  No nausea, no vomiting.  No diarrhea.  No constipation. Genitourinary: Negative for dysuria. Musculoskeletal: Negative for back pain. Skin: Negative for rash. Neurological: Negative for headaches. No focal numbness, tingling or weakness.  Psych:  denies anxiety 10-point ROS otherwise negative.  ____________________________________________   PHYSICAL EXAM:  VITAL SIGNS: ED Triage Vitals  Enc Vitals Group     BP 11/03/15 1611 189/119 mmHg     Pulse Rate 11/03/15 1611 58     Resp 11/03/15 1611 20     Temp 11/03/15 1611 98.4 F (36.9 C)     Temp Source 11/03/15 1611 Oral     SpO2 11/03/15 1611 100 %     Weight 11/03/15 1611  130 lb (58.968 kg)     Height 11/03/15 1611 5\' 4"  (1.626 m)     Head Cir --      Peak Flow --      Pain Score 11/03/15 1614 8     Pain Loc --      Pain Edu? --      Excl. in GC? --     Constitutional: Alert and oriented. Well appearing and in no acute distress. Answers questions appropriately. Eyes: Conjunctivae are normal.  EOMI. No scleral icterus. Head: Atraumatic. Nose: No congestion/rhinnorhea. Mouth/Throat: Mucous membranes are moist.  Neck: No stridor.  Supple.  No JVD. Cardiovascular: Normal rate, regular rhythm. No murmurs, rubs or gallops.  Respiratory: Normal respiratory effort.  No accessory muscle use or retractions. Lungs CTAB.  No wheezes, rales or ronchi. Gastrointestinal: Soft and nondistended.  Mild epigastric tenderness to palpation. No guarding or rebound.  No peritoneal signs. Musculoskeletal: No LE edema. No ttp in the calves or palpable cords.  Negative Homan's sign. Neurologic:  A&Ox3.  Speech is clear.  Face and smile are symmetric.  EOMI.  Moves all extremities well. Skin:  Skin is warm, dry and intact. No rash noted. Psychiatric: Mood and affect are normal. Speech and behavior are normal.  Normal judgement.  ____________________________________________   LABS (all labs ordered are listed, but only abnormal results are displayed)  Labs Reviewed  BASIC METABOLIC PANEL  CBC  TROPONIN I   ____________________________________________  EKG  ED ECG REPORT I, Rockne Menghini, the attending physician, personally viewed and interpreted this ECG.   Date: 11/03/2015  EKG Time: 1616  Rate: 56  Rhythm: sinus bradycardia  Axis: Normal  Intervals:none  ST&T Change: Specific T-wave inversion in V1. No ST elevation.  ____________________________________________  RADIOLOGY  Dg Chest 2 View  11/03/2015  CLINICAL DATA:  Left-sided chest pain radiating to the left shoulder, high blood pressure, headache EXAM: CHEST  2 VIEW COMPARISON:  None. FINDINGS:  No active infiltrate or effusion is seen. Somewhat nodular apical pleural thickening is noted left-greater-than-right. Comparison with prior or followup chest x-ray is recommended. Mediastinal and hilar contours are unremarkable. The heart is within normal limits in size. On the lateral view there is a nodular opacity overlying the posterior superior aspect of a lower thoracic vertebral body probably bony in origin. Again comparison with prior or followup chest x-ray is recommended to assess stability. Surgical clips are noted around the GE junction. IMPRESSION: 1. No infiltrate or effusion. 2. Nodular opacity on the lateral view probably bony in origin but compare with prior or followup chest x-ray to assess stability. 3. Nodular apical pleural thickening left-greater-than-right. Again compare with prior or followup chest x-ray. Electronically Signed   By: Dwyane Dee M.D.   On: 11/03/2015 16:54    ____________________________________________   PROCEDURES  Procedure(s) performed: None  Critical Care performed: No ____________________________________________   INITIAL IMPRESSION / ASSESSMENT AND PLAN / ED COURSE  Pertinent labs & imaging results that were available during my care  of the patient were reviewed by me and considered in my medical decision making (see chart for details).  50 y.o. female with no significant personal or family cardiac risk factors presenting with a central dull ache in her chest that started yesterday. The patient is hypertensive with mild bradycardia here, but has never been hypertensive in the past. Without any intervention her blood pressure started to come down, so plan to treat her pain and see if this improves her blood pressure as well. The patient has a troponin which is negative and EKG which does not show ischemic changes, no chest x-ray which is reassuring. There are no acute abnormal findings on my cardiopulmonary exam. I do find some epigastric discomfort on  my abdominal examination, and query reflux or peptic ulcer disease as a possible cause for the patient's symptoms. I will start her on a PPI, but we'll also have her follow-up with Dr. Lewie LoronGolan for risk stratification study such as a stress test. He will call her in the morning and knows about the patient as I have spoken with him about her. She understands return precautions as well as follow-up instructions.  ____________________________________________  FINAL CLINICAL IMPRESSION(S) / ED DIAGNOSES  Final diagnoses:  Epigastric pain  Chest pain, unspecified chest pain type  Secondary hypertension, unspecified  Nausea without vomiting  Gastroesophageal reflux disease, esophagitis presence not specified      NEW MEDICATIONS STARTED DURING THIS VISIT:  New Prescriptions   No medications on file     Rockne MenghiniAnne-Caroline Annissa Andreoni, MD 11/03/15 2009

## 2015-11-03 NOTE — Discharge Instructions (Signed)
Dr. Marylou FlesherGolan's office will call you tomorrow for cardiology follow up.  Please start the Omeprazole to treat likely reflux and continue to take this medication.  Follow up with your primary care doctor.  Return to the emergency department for severe pain, palpitations, lightheadedness or fainting, fever, inability to keep down fluids, or any other symptoms concerning to you.

## 2015-11-03 NOTE — Progress Notes (Signed)
         Patient: Tami Williams Female    DOB: 09/13/1965   50 y.o.   MRN: [id number] Visit Date: 6/2[name]0/2017  Today's Provider: Lorie PhenixNancy Dayzha Pogosyan, MD   Chief Complaint  Patient presents with  . Chest Pain   Subjective:    Chest Pain  This is a new problem. The current episode started yesterday. The onset quality is sudden. The pain is present in the substernal region. The quality of the pain is described as dull and tightness. The pain radiates to the left shoulder and mid back. Associated symptoms include back pain and nausea. Pertinent negatives include no abdominal pain, cough, diaphoresis, fever, irregular heartbeat, leg pain, palpitations, shortness of breath or vomiting. She has tried NSAIDs for the symptoms. The treatment provided no relief.  Her family medical history is significant for heart disease (Mom has heart disease).    No Known Allergies Current Meds  Medication Sig  . escitalopram (LEXAPRO) 10 MG tablet Take 0.5 tablets (5 mg total) by mouth at bedtime. Take 1/2 tablet for a week then increase to 1 tablet daily  . SYNTHROID 25 MCG tablet TK 1 T PO QD    Review of Systems  Constitutional: Positive for fatigue (Has been tired today). Negative for fever, chills, diaphoresis, activity change, appetite change and unexpected weight change.  Respiratory: Negative.  Negative for cough and shortness of breath.   Cardiovascular: Positive for chest pain. Negative for palpitations and leg swelling.  Gastrointestinal: Positive for nausea. Negative for vomiting, abdominal pain, diarrhea, constipation, blood in stool, abdominal distention, anal bleeding and rectal pain.  Musculoskeletal: Positive for back pain.    Social History  Substance Use Topics  . Smoking status: Never Smoker   . Smokeless tobacco: Never Used  . Alcohol Use: 0.0 oz/week    0 Standard drinks or equivalent per week     Comment: occasional   Objective:   BP 172/112 mmHg  Pulse 60  Temp(Src) 97.9 F  (36.6 C) (Oral)  Resp 16  Wt 130 lb (58.968 kg)  Physical Exam  Constitutional: She is oriented to person, place, and time. She appears well-developed and well-nourished.  Cardiovascular: Normal rate and regular rhythm.   Pulmonary/Chest: Breath sounds normal.  Musculoskeletal: Normal range of motion. She exhibits no tenderness.  Neurological: She is alert and oriented to person, place, and time.  Skin: Skin is warm and dry.  Psychiatric: She has a normal mood and affect. Her behavior is normal. Judgment and thought content normal.      Assessment & Plan:     1. Chest pain, unspecified chest pain type EKG unchanged. Pain did not respond to GI cocktail. Will refer to ER for further evaluation in light of pain and increased BP.  - EKG 12-Lead   No charge for ov as referred to ER.      Patient was seen and examined by Leo GrosserNancy J. Masiyah Jorstad, MD, and note scribed by Kavin LeechLaura Walsh, CMA.   I have reviewed the document for accuracy and completeness and I agree with above. - Leo GrosserNancy J. Anarie Kalish, MD   Lorie PhenixNancy Jashley Yellin, MD  [facility]

## 2015-11-05 ENCOUNTER — Encounter: Payer: Self-pay | Admitting: Cardiology

## 2015-11-05 ENCOUNTER — Ambulatory Visit (INDEPENDENT_AMBULATORY_CARE_PROVIDER_SITE_OTHER): Payer: BC Managed Care – PPO | Admitting: Cardiology

## 2015-11-05 VITALS — BP 146/92 | HR 63 | Ht 64.0 in | Wt 127.5 lb

## 2015-11-05 DIAGNOSIS — R030 Elevated blood-pressure reading, without diagnosis of hypertension: Secondary | ICD-10-CM | POA: Diagnosis not present

## 2015-11-05 DIAGNOSIS — R079 Chest pain, unspecified: Secondary | ICD-10-CM

## 2015-11-05 DIAGNOSIS — IMO0001 Reserved for inherently not codable concepts without codable children: Secondary | ICD-10-CM

## 2015-11-05 NOTE — Patient Instructions (Addendum)
Medication Instructions:  Your physician recommends that you continue on your current medications as directed. Please refer to the Current Medication list given to you today.   Labwork: None ordered  Testing/Procedures: Your physician has requested that you have an echocardiogram. Echocardiography is a painless test that uses sound waves to create images of your heart. It provides your doctor with information about the size and shape of your heart and how well your heart's chambers and valves are working. This procedure takes approximately one hour. There are no restrictions for this procedure.  [facility] MYOVIEW  Your caregiver has ordered a Stress Test with nuclear imaging. The purpose of this test is to evaluate the blood supply to your heart muscle. This procedure is referred to as a "Non-Invasive Stress Test." This is because other than having an IV started in your vein, nothing is inserted or "invades" your body. Cardiac stress tests are done to find areas of poor blood flow to the heart by determining the extent of coronary artery disease (CAD). Some patients exercise on a treadmill, which naturally increases the blood flow to your heart, while others who are  unable to walk on a treadmill due to physical limitations have a pharmacologic/chemical stress agent called Lexiscan . This medicine will mimic walking on a treadmill by temporarily increasing your coronary blood flow.   Please note: these test may take anywhere between 2-4 hours to complete  PLEASE REPORT TO [facility] MEDICAL MALL ENTRANCE  THE VOLUNTEERS AT THE FIRST DESK WILL DIRECT YOU WHERE TO GO  Date of Procedure:_Wednesday November 18, 2015 at 08:00 AM  Arrival Time for Procedure:_Arrive at 07:45AM to register    PLEASE NOTIFY THE OFFICE AT LEAST 24 HOURS IN ADVANCE IF YOU ARE UNABLE TO KEEP YOUR APPOINTMENT.  [phone] AND  PLEASE NOTIFY NUCLEAR MEDICINE AT [facility] AT LEAST 24 HOURS IN ADVANCE IF YOU ARE UNABLE TO KEEP YOUR  APPOINTMENT. [phone]  How to prepare for your Myoview test:   Do not eat or drink after midnight  No caffeine for 24 hours prior to test  No smoking 24 hours prior to test.  Your medication may be taken with water.  If your doctor stopped a medication because of this test, do not take that medication.  Ladies, please do not wear dresses.  Skirts or pants are appropriate. Please wear a short sleeve shirt.  No perfume, cologne or lotion.  Wear comfortable walking shoes. No heels!   Follow-Up: Your physician recommends that you schedule a follow-up appointment after testing to review results.   It was a pleasure seeing you today here in the office. Please do not hesitate to give [place] a call back if you have any further questions. [phone]  Chelyan Cellar RN, BSN      Any Other Special Instructions Will Be Listed Below (If Applicable).     If you need a refill on your cardiac medications before your next appointment, please call your pharmacy.  Echocardiogram An echocardiogram, or echocardiography, uses sound waves (ultrasound) to produce an image of your heart. The echocardiogram is simple, painless, obtained within a short period of time, and offers valuable information to your health care provider. The images from an echocardiogram can provide information such as:  Evidence of coronary artery disease (CAD).  Heart size.  Heart muscle function.  Heart valve function.  Aneurysm detection.  Evidence of a past heart attack.  Fluid buildup around the heart.  Heart muscle thickening.  Assess heart valve function. LET  YOUR HEALTH CARE PROVIDER KNOW ABOUT:  Any allergies you have.  All medicines you are taking, including vitamins, herbs, eye drops, creams, and over-the-counter medicines.  Previous problems you or members of your family have had with the use of anesthetics.  Any blood disorders you have.  Previous surgeries you have had.  Medical  conditions you have.  Possibility of pregnancy, if this applies. BEFORE THE PROCEDURE  No special preparation is needed. Eat and drink normally.  PROCEDURE   In order to produce an image of your heart, gel will be applied to your chest and a wand-like tool (transducer) will be moved over your chest. The gel will help transmit the sound waves from the transducer. The sound waves will harmlessly bounce off your heart to allow the heart images to be captured in real-time motion. These images will then be recorded.  You may need an IV to receive a medicine that improves the quality of the pictures. AFTER THE PROCEDURE You may return to your normal schedule including diet, activities, and medicines, unless your health care provider tells you otherwise.   This information is not intended to replace advice given to you by your health care provider. Make sure you discuss any questions you have with your health care provider.   Document Released: 04/29/2000 Document Revised: 05/23/2014 Document Reviewed: 01/07/2013 Elsevier Interactive Patient Education 2016 Elsevier Inc.     Cardiac Nuclear Scanning A cardiac nuclear scan is used to check your heart for problems, such as the following:  A portion of the heart is not getting enough blood.  Part of the heart muscle has died, which happens with a heart attack.  The heart wall is not working normally.  In this test, a radioactive dye (tracer) is injected into your bloodstream. After the tracer has traveled to your heart, a scanning device is used to measure how much of the tracer is absorbed by or distributed to various areas of your heart. LET [facility] PROVIDER KNOW ABOUT:  Any allergies you have.  All medicines you are taking, including vitamins, herbs, eye drops, creams, and over-the-counter medicines.  Previous problems you or members of your family have had with the use of anesthetics.  Any blood disorders you  have.  Previous surgeries you have had.  Medical conditions you have.  RISKS AND COMPLICATIONS Generally, this is a safe procedure. However, as with any procedure, problems can occur. Possible problems include:   Serious chest pain.  Rapid heartbeat.  Sensation of warmth in your chest. This usually passes quickly. BEFORE THE PROCEDURE Ask your health care provider about changing or stopping your regular medicines. PROCEDURE This procedure is usually done at a hospital and takes 2-4 hours.  An IV tube is inserted into one of your veins.  Your health care provider will inject a small amount of radioactive tracer through the tube.  You will then wait for 20-40 minutes while the tracer travels through your bloodstream.  You will lie down on an exam table so images of your heart can be taken. Images will be taken for about 15-20 minutes.  You will exercise on a treadmill or stationary bike. While you exercise, your heart activity will be monitored with an electrocardiogram (ECG), and your blood pressure will be checked.  If you are unable to exercise, you may be given a medicine to make your heart beat faster.  When blood flow to your heart has peaked, tracer will again be injected through the IV tube.  After 20-40 minutes, you will get back on the exam table and have more images taken of your heart.  When the procedure is over, your IV tube will be removed. AFTER THE PROCEDURE  You will likely be able to leave shortly after the test. Unless your health care provider tells you otherwise, you may return to your normal schedule, including diet, activities, and medicines.  Make sure you find out how and when you will get your test results.   This information is not intended to replace advice given to you by your health care provider. Make sure you discuss any questions you have with your health care provider.   Document Released: 05/27/2004 Document Revised: 05/07/2013 Document  Reviewed: 04/10/2013 Elsevier Interactive Patient Education 2016 [employer]    How to Take Your Blood Pressure HOW DO I GET A BLOOD PRESSURE MACHINE?  You can buy an electronic home blood pressure machine at your local pharmacy. Insurance will sometimes cover the cost if you have a prescription.  Ask your doctor what type of machine is best for you. There are different machines for your arm and your wrist.  If you decide to buy a machine to check your blood pressure on your arm, first check the size of your arm so you can buy the right size cuff. To check the size of your arm:   Use a measuring tape that shows both inches and centimeters.   Wrap the measuring tape around the upper-middle part of your arm. You may need someone to help you measure.   Write down your arm measurement in both inches and centimeters.   To measure your blood pressure correctly, it is important to have the right size cuff.   If your arm is up to 13 inches (up to 34 centimeters), get an adult cuff size.  If your arm is 13 to 17 inches (35 to 44 centimeters), get a large adult cuff size.    If your arm is 17 to 20 inches (45 to 52 centimeters), get an adult thigh cuff.  WHAT DO THE NUMBERS MEAN?   There are two numbers that make up your blood pressure. For example: 120/80.  The first number (120 in our example) is called the "systolic pressure." It is a measure of the pressure in your blood vessels when your heart is pumping blood.  The second number (80 in our example) is called the "diastolic pressure." It is a measure of the pressure in your blood vessels when your heart is resting between beats.  Your doctor will tell you what your blood pressure should be. WHAT SHOULD I DO BEFORE I CHECK MY BLOOD PRESSURE?   Try to rest or relax for at least 30 minutes before you check your blood pressure.  Do not smoke.  Do not have any drinks with caffeine, such  as:  Soda.  Coffee.  Tea.  Check your blood pressure in a quiet room.  Sit down and stretch out your arm on a table. Keep your arm at about the level of your heart. Let your arm relax.  Make sure that your legs are not crossed. HOW DO I CHECK MY BLOOD PRESSURE?  Follow the directions that came with your machine.  Make sure you remove any tight-fitting clothing from your arm or wrist. Wrap the cuff around your upper arm or wrist. You should be able to fit a finger between the cuff and your arm. If you cannot fit a finger between the cuff  and your arm, it is too tight and should be removed and rewrapped.  Some units require you to manually pump up the arm cuff.  Automatic units inflate the cuff when you press a button.  Cuff deflation is automatic in both models.  After the cuff is inflated, the unit measures your blood pressure and pulse. The readings are shown on a monitor. Hold still and breathe normally while the cuff is inflated.  Getting a reading takes less than a minute.  Some models store readings in a memory. Some provide a printout of readings. If your machine does not store your readings, keep a written record.  Take readings with you to your next visit with your doctor.   This information is not intended to replace advice given to you by your health care provider. Make sure you discuss any questions you have with your health care provider.   Document Released: 04/14/2008 Document Revised: 05/23/2014 Document Reviewed: 06/27/2013 Elsevier Interactive Patient Education 2016 Elsevier Inc.      Blood Pressure Record Sheet Your blood pressure on this visit to the emergency department or clinic is elevated. This does not necessarily mean you have high blood pressure (hypertension), but it does mean that your blood pressure needs to be rechecked. Many times your blood pressure can increase due to illness, pain, anxiety, or other factors. We recommend that you get a  series of blood pressure readings done over a period of 5 days. It is best to get a reading in the morning and one in the evening. You should make sure to sit and relax for 1-5 minutes before the reading is taken. Write the readings down and make a follow-up appointment with your health care provider to discuss the results. If there is not a free clinic or a drug store with a blood-pressure-taking machine near you, you can purchase blood-pressure-taking equipment from a drug store. Having one in the home allows you the convenience of taking your blood pressure while you are home and relaxed.  Your blood pressure in the emergency department or clinic on ________ was ____________________. BLOOD PRESSURE LOG Date: _______________________  a.m. _____________________  p.m. _____________________ Date: _______________________  a.m. _____________________  p.m. _____________________ Date: _______________________  a.m. _____________________  p.m. _____________________ Date: _______________________  a.m. _____________________  p.m. _____________________ Date: _______________________  a.m. _____________________  p.m. _____________________   This information is not intended to replace advice given to you by your health care provider. Make sure you discuss any questions you have with your health care provider.   Document Released: 01/29/2003 Document Revised: 05/23/2014 Document Reviewed: 06/25/2013 Elsevier Interactive Patient Education [employer]

## 2015-11-05 NOTE — Progress Notes (Signed)
Cardiology Office Note   Date:  11/05/2015   ID:  Filbert SchilderLisa P Nin, DOB August 12, 1965, MRN [id number]  Referring Doctor:  Lorie PhenixNancy Maloney, MD   Cardiologist:   Almond LintAileen Giovoni Bunch, MD   Reason for consultation:  Chief Complaint  Patient presents with  . other    [facility]; chest pain. Meds reviewed by the patient verbally. Pt. c/o HTN.       History of Present Illness: Tami Williams is a 50 y.o. female who presents for Evaluation of chest pain. Symptoms started beginning of this week, 3 days ago. She describes the chest pain as an aching sensation in the center of her chest, lasting almost 2 days, almost continuously, 9 out of 10 in severity at its peak, she noted some radiation to the left side of the chest and into the left shoulder. She has issues with reflux in the past and thought that this chest pain is likely from reflux. She eventually made an appointment to see her PCP. She was given a GI cocktail, however that did not help with the chest pain. Also at that time, her blood pressure was noted to be elevated. She never had issues with hypertension in the past. Eventually, she was referred to the ER for further evaluation. She stayed there for hours. Chest pain went away with medication. She was ruled out. She was referred to cardiology for further evaluation.  More recently, she drank a diet soda. She had been doing this for a long time and never had issues. After doing so, she noted recurrence of the chest pain again. That made her think that it might be reflux issues as well.  Patient denies shortness of breath, palpitations, PND, orthopnea, edema. No fever, cough, colds, abdominal pain.  ROS:  Please see the history of present illness. Aside from mentioned under HPI, all other systems are reviewed and negative.    History reviewed. No pertinent past medical history. History of reflux and hypothyroidism.  Past Surgical History  Procedure Laterality Date  . Nissen  fundoplication  2007    dr. Katrinka Blazingsmith  . Abdominal hysterectomy      due to endometriosis     reports that she has never smoked. She has never used smokeless tobacco. She reports that she drinks alcohol. She reports that she does not use illicit drugs.   family history includes Heart disease in her mother; Heart failure in her mother; Hypertension in her father and mother. Mother started with hypertension in her 3220s to 30s. She has a history of cardiac arrest needing ICD. History unclear. Father developed hypertension in his 5060s, patient reports though that he does not see his primary care provider regularly.  Outpatient Prescriptions Prior to Visit  Medication Sig Dispense Refill  . escitalopram (LEXAPRO) 10 MG tablet Take 0.5 tablets (5 mg total) by mouth at bedtime. Take 1/2 tablet for a week then increase to 1 tablet daily 30 tablet 5  . omeprazole (PRILOSEC) 40 MG capsule Take 1 capsule (40 mg total) by mouth daily. 30 capsule 0  . SYNTHROID 25 MCG tablet TK 1 T PO QD  6   No facility-administered medications prior to visit.     Allergies: Review of patient's allergies indicates no known allergies.    PHYSICAL EXAM: VS:  BP 146/92 mmHg  Pulse 63  Ht 5\' 4"  (1.626 m)  Wt 127 lb 8 oz (57.834 kg)  BMI 21.87 kg/m2 , Body mass index is 21.87 kg/(m^2). Wt  Readings from Last 3 Encounters:  11/05/15 127 lb 8 oz (57.834 kg)  11/03/15 130 lb (58.968 kg)  11/03/15 130 lb (58.968 kg)    GENERAL:  well developed, well nourished, obese, not in acute distress HEENT: normocephalic, pink conjunctivae, anicteric sclerae, no xanthelasma, normal dentition, oropharynx clear NECK:  no neck vein engorgement, JVP normal, no hepatojugular reflux, carotid upstroke brisk and symmetric, no bruit, no thyromegaly, no lymphadenopathy LUNGS:  good respiratory effort, clear to auscultation bilaterally CV:  PMI not displaced, no thrills, no lifts, S1 and S2 within normal limits, no palpable S3 or S4, no murmurs,  no rubs, no gallops ABD:  Soft, nontender, nondistended, normoactive bowel sounds, no abdominal aortic bruit, no hepatomegaly, no splenomegaly MS: nontender back, no kyphosis, no scoliosis, no joint deformities EXT:  2+ DP/PT pulses, no edema, no varicosities, no cyanosis, no clubbing SKIN: warm, nondiaphoretic, normal turgor, no ulcers NEUROPSYCH: alert, oriented to person, place, and time, sensory/motor grossly intact, normal mood, appropriate affect  Recent Labs: 03/20/2015: TSH 7.200* 09/15/2015: ALT 22 11/03/2015: BUN 10; Creatinine, Ser 0.78; Hemoglobin 13.8; Platelets 222; Potassium 4.2; Sodium 137   Lipid Panel    Component Value Date/Time   CHOL 173 09/15/2015 0804   CHOL 161 01/29/2013   TRIG 103 09/15/2015 0804   HDL 63 09/15/2015 0804   HDL 64 01/29/2013   LDLCALC 89 09/15/2015 0804   LDLCALC 85 01/29/2013     Other studies Reviewed:  EKG:  The ekg from 11/05/2015 was personally reviewed by me and it revealed sinus rhythm, 64 BPM.  Additional studies/ records that were reviewed personally reviewed by me today include: None available   ASSESSMENT AND PLAN: Chest pain with some typical features. Risk factors for CAD include possible history of CAD in the family, possible hypertension Recommend exercise nuclear stress test for greater specificity. Recommend echocardiogram.  Elevated blood pressure measurements Patient has no prior diagnosis of hypertension Recommend BP log. If persistently greater than 140/80, we will need to start antihypertensive medication Recommend low-sodium diet.   Current medicines are reviewed at length with the patient today.  The patient does not have concerns regarding medicines.  Labs/ tests ordered today include:  Orders Placed This Encounter  Procedures  . NM Myocar Multi W/Spect W/Wall Motion / EF  . EKG 12-Lead  . ECHOCARDIOGRAM COMPLETE    I had a lengthy and detailed discussion with the patient regarding diagnoses,  prognosis, diagnostic options, treatment options.   I counseled the patient on importance of lifestyle modification including heart healthy diet, regular physical activity .   Disposition:   FU with undersigned after tests    Signed, Almond LintAileen Chuong Casebeer, MD  11/05/2015 1:53 PM    [facility]

## 2015-11-09 ENCOUNTER — Telehealth: Payer: Self-pay | Admitting: Cardiology

## 2015-11-09 NOTE — Telephone Encounter (Signed)
Pt states she thinks her echo and stress test needs to be moved up BP readings: 166/103-Friday 188/103-this morning, HR 76 States she is having and "ache" in the middle of her chest. Denies SOB.

## 2015-11-09 NOTE — Telephone Encounter (Signed)
Patient reports that she was at work, started feeling bad, and was having some chest pain. She checked her blood pressure at that time while at work it was 188/103 with HR 76. She refused to go to emergency room because she has already been and they had her waiting for hours due to her blood pressure and everything was negative. She stated her EKG and blood work was normal on that previous visit and she just doesn't have $500.00 to go to ED again. She insisted that she needed her testing done sooner than July 5th and didn't understand why she had to wait. Let her know that they were booked but that I would look and see what I could do. I was able to reschedule her stress test for June 29th at 08:30AM. Reviewed stress test instructions, appointment time, location, and she verbalized understanding. Instructed patient that if her chest pain should not go away then she needs to report to the ED and she verbalized understanding. She also wanted to know about her echocardiogram and I let her know what that test looks for and that right now the stress test is what we need to have done first. She verbalized understanding and agreement with plan of care and had no further questions at this time. Let her know to keep monitoring her blood pressures as well.

## 2015-11-12 ENCOUNTER — Encounter
Admission: RE | Admit: 2015-11-12 | Discharge: 2015-11-12 | Disposition: A | Discharge status: Home / Self Care | Payer: BC Managed Care – PPO | Source: Ambulatory Visit | Attending: Cardiology | Admitting: Cardiology

## 2015-11-12 ENCOUNTER — Telehealth: Payer: Self-pay | Admitting: Family Medicine

## 2015-11-12 DIAGNOSIS — R030 Elevated blood-pressure reading, without diagnosis of hypertension: Secondary | ICD-10-CM | POA: Diagnosis not present

## 2015-11-12 DIAGNOSIS — R079 Chest pain, unspecified: Secondary | ICD-10-CM | POA: Diagnosis not present

## 2015-11-12 DIAGNOSIS — F419 Anxiety disorder, unspecified: Secondary | ICD-10-CM

## 2015-11-12 DIAGNOSIS — IMO0001 Reserved for inherently not codable concepts without codable children: Secondary | ICD-10-CM

## 2015-11-12 MED ORDER — TECHNETIUM TC 99M TETROFOSMIN IV KIT
33.0000 | PACK | Freq: Once | INTRAVENOUS | Status: AC | PRN
  Administered 2015-11-12: 31.08 via INTRAVENOUS

## 2015-11-12 MED ORDER — TECHNETIUM TC 99M TETROFOSMIN IV KIT
13.5220 | PACK | Freq: Once | INTRAVENOUS | Status: AC | PRN
  Administered 2015-11-12: 13.522 via INTRAVENOUS

## 2015-11-12 MED ORDER — REGADENOSON 0.4 MG/5ML IV SOLN
0.4000 mg | Freq: Once | INTRAVENOUS | Status: AC
  Administered 2015-11-12: 0.4 mg via INTRAVENOUS

## 2015-11-12 MED ORDER — SERTRALINE HCL 50 MG PO TABS: 100.0000 mg | ORAL_TABLET | Freq: Every day | ORAL | Status: DC

## 2015-11-12 NOTE — Telephone Encounter (Signed)
Pt stating she is having drowsiness/feeling sleepy during the day.  Pt is asking if the Rx escitalopram (LEXAPRO) 10 MG tablet can be changed to something different to help with this.  [facility] [address]  [id number]#[phone]/MW  FYI-Pt states she has decided to start seeing Jenni/MW

## 2015-11-12 NOTE — Telephone Encounter (Signed)
Patient agrees to start sertraline. Patient advised to follow-up with jenny for additional refills.

## 2015-11-12 NOTE — Telephone Encounter (Signed)
Can try to switch to Sertraline which is in the same family?

## 2015-11-13 ENCOUNTER — Telehealth: Payer: Self-pay | Admitting: Cardiology

## 2015-11-13 LAB — NM MYOCAR MULTI W/SPECT W/WALL MOTION / EF
CHL CUP NUCLEAR SDS: 1
CHL CUP RESTING HR STRESS: 52 {beats}/min
CSEPHR: 55 %
CSEPPHR: 95 {beats}/min
LV dias vol: 76 mL (ref 46–106)
LVSYSVOL: 21 mL
SRS: 0
SSS: 1
TID: 1.06

## 2015-11-13 NOTE — Telephone Encounter (Signed)
Pt would like stress test results. Please call. 

## 2015-11-13 NOTE — Telephone Encounter (Signed)
Reviewed preliminary myoview results w/pt who understands Dr. Alvino ChapelIngal will read study and give final results.

## 2015-11-16 ENCOUNTER — Encounter: Payer: Self-pay | Admitting: Physician Assistant

## 2015-11-16 ENCOUNTER — Ambulatory Visit (INDEPENDENT_AMBULATORY_CARE_PROVIDER_SITE_OTHER): Payer: BC Managed Care – PPO | Admitting: Physician Assistant

## 2015-11-16 VITALS — BP 150/90 | HR 74 | Temp 98.0°F | Resp 16 | Wt 131.0 lb

## 2015-11-16 DIAGNOSIS — I10 Essential (primary) hypertension: Secondary | ICD-10-CM

## 2015-11-16 DIAGNOSIS — K449 Diaphragmatic hernia without obstruction or gangrene: Secondary | ICD-10-CM

## 2015-11-16 DIAGNOSIS — K219 Gastro-esophageal reflux disease without esophagitis: Secondary | ICD-10-CM

## 2015-11-16 MED ORDER — PANTOPRAZOLE SODIUM 40 MG PO TBEC: 40.0000 mg | DELAYED_RELEASE_TABLET | Freq: Every day | ORAL | Status: DC

## 2015-11-16 MED ORDER — HYDROCHLOROTHIAZIDE 25 MG PO TABS: 25.0000 mg | ORAL_TABLET | Freq: Every day | ORAL | Status: DC

## 2015-11-16 NOTE — Progress Notes (Signed)
Patient: Tami CopierLisa P Spahr Female    DOB: [birth year]   50 y.o.   MRN: [id number] Visit Date: 11/16/2015  Today's Provider: Margaretann LovelessJennifer M Tjay Velazquez, PA-C   Chief Complaint  Patient presents with  . Hypertension   Subjective:    HPI Hypertension: Patient here concern of elevated blood pressure.She reports that her blood pressure reading today was 151/101 @ 4:30 am, 155/105 @9 :30 and 162/102 at 12:43 pm. She is exercising and is adherent to low salt diet. Cardiac symptoms Williams. Patient denies chest pain, fatigue, irregular heart beat, lower extremity edema and palpitations. She underwent a cardiac stress test which showed no ischemia. However, it did mention significant GI uptake artifact noted. She has had EKGs all which were normal. She is scheduled for echo on 11/18/15. She does have positive family history for HTN on both mother and father sides of family.  She is having a worsening acid reflux. Omeprazole is not helping. She is having sharp epigastric pain that radiates to left upper quadrant and chest. She states this pain is exactly like what she had when she had her hiatal hernia. This was found on EGD and repaired in 2003. No problems since until recently.     No Known Allergies Current Meds  Medication Sig  . omeprazole (PRILOSEC) 40 MG capsule Take 1 capsule (40 mg total) by mouth daily.  . sertraline (ZOLOFT) 50 MG tablet Take 2 tablets (100 mg total) by mouth daily.  Marland Kitchen. SYNTHROID 25 MCG tablet TK 1 T PO QD    Review of Systems  Constitutional: Negative for fever, chills and fatigue.  Eyes: Negative for visual disturbance.  Respiratory: Negative for cough, chest tightness and shortness of breath.   Cardiovascular: Negative for chest pain, palpitations and leg swelling.  Gastrointestinal: Positive for abdominal pain (epigastric). Negative for nausea, constipation and abdominal distention.  Musculoskeletal: Negative for back pain.  Neurological: Negative for dizziness, syncope,  weakness, light-headedness and headaches.  Psychiatric/Behavioral: Positive for sleep disturbance. Negative for dysphoric mood. The patient is not nervous/anxious.     Social History  Substance Use Topics  . Smoking status: Never Smoker   . Smokeless tobacco: Never Used  . Alcohol Use: 0.0 oz/week    0 Standard drinks or equivalent per week     Comment: occasional   Objective:   BP 150/90 mmHg  Pulse 74  Temp(Src) 98 F (36.7 C) (Oral)  Resp 16  Wt 131 lb (59.421 kg)  Physical Exam  Constitutional: She appears well-developed and well-nourished. No distress.  Neck: Normal range of motion. Neck supple.  Cardiovascular: Normal rate, regular rhythm and normal heart sounds.  Exam reveals no gallop and no friction rub.   No murmur heard. Pulmonary/Chest: Effort normal and breath sounds normal. No respiratory distress. She has no wheezes. She has no rales.  Abdominal: Soft. Bowel sounds are normal. She exhibits no distension and no mass. There is tenderness in the epigastric area. There is no rebound and no guarding.  Skin: She is not diaphoretic.  Vitals reviewed.     Assessment & Plan:     1. Essential hypertension Will start HCTZ as below for HTN. She is to call if she has any adverse reaction to the medication. I will see her back in 4 weeks to recheck BP. - hydrochlorothiazide (HYDRODIURIL) 25 MG tablet; Take 1 tablet (25 mg total) by mouth daily.  Dispense: 30 tablet; Refill: 1  2. Gastro-esophageal reflux disease without esophagitis Will switch from  omeprazole to protonix as below. Will refer back to GI for further evaluation. H/O hiatal hernia with repair in 2003 and patient states symptoms feel similar.  - pantoprazole (PROTONIX) 40 MG tablet; Take 1 tablet (40 mg total) by mouth daily.  Dispense: 30 tablet; Refill: 1 - Ambulatory referral to Gastroenterology  3. Diaphragmatic hernia without obstruction and without gangrene See above medical treatment plan. -  pantoprazole (PROTONIX) 40 MG tablet; Take 1 tablet (40 mg total) by mouth daily.  Dispense: 30 tablet; Refill: 1 - Ambulatory referral to Gastroenterology       Margaretann LovelessJennifer M Maryjean Corpening, PA-C  [facility]

## 2015-11-16 NOTE — Patient Instructions (Signed)
Hydrochlorothiazide, HCTZ capsules or tablets What is this medicine? HYDROCHLOROTHIAZIDE (hye droe klor oh THYE a zide) is a diuretic. It increases the amount of urine passed, which causes the body to lose salt and water. This medicine is used to treat high blood pressure. It is also reduces the swelling and water retention caused by various medical conditions, such as heart, liver, or kidney disease. This medicine may be used for other purposes; ask your health care provider or pharmacist if you have questions. What should I tell my health care provider before I take this medicine? They need to know if you have any of these conditions: -diabetes -gout -immune system problems, like lupus -kidney disease or kidney stones -liver disease -pancreatitis -small amount of urine or difficulty passing urine -an unusual or allergic reaction to hydrochlorothiazide, sulfa drugs, other medicines, foods, dyes, or preservatives -pregnant or trying to get pregnant -breast-feeding How should I use this medicine? Take this medicine by mouth with a glass of water. Follow the directions on the prescription label. Take your medicine at regular intervals. Remember that you will need to pass urine frequently after taking this medicine. Do not take your doses at a time of day that will cause you problems. Do not stop taking your medicine unless your doctor tells you to. Talk to your pediatrician regarding the use of this medicine in children. Special care may be needed. Overdosage: If you think you have taken too much of this medicine contact a poison control center or emergency room at once. NOTE: This medicine is only for you. Do not share this medicine with others. What if I miss a dose? If you miss a dose, take it as soon as you can. If it is almost time for your next dose, take only that dose. Do not take double or extra doses. What may interact with this  medicine? -cholestyramine -colestipol -digoxin -dofetilide -lithium -medicines for blood pressure -medicines for diabetes -medicines that relax muscles for surgery -other diuretics -steroid medicines like prednisone or cortisone This list may not describe all possible interactions. Give your health care provider a list of all the medicines, herbs, non-prescription drugs, or dietary supplements you use. Also tell them if you smoke, drink alcohol, or use illegal drugs. Some items may interact with your medicine. What should I watch for while using this medicine? Visit your doctor or health care professional for regular checks on your progress. Check your blood pressure as directed. Ask your doctor or health care professional what your blood pressure should be and when you should contact him or her. You may need to be on a special diet while taking this medicine. Ask your doctor. Check with your doctor or health care professional if you get an attack of severe diarrhea, nausea and vomiting, or if you sweat a lot. The loss of too much body fluid can make it dangerous for you to take this medicine. You may get drowsy or dizzy. Do not drive, use machinery, or do anything that needs mental alertness until you know how this medicine affects you. Do not stand or sit up quickly, especially if you are an older patient. This reduces the risk of dizzy or fainting spells. Alcohol may interfere with the effect of this medicine. Avoid alcoholic drinks. This medicine may affect your blood sugar level. If you have diabetes, check with your doctor or health care professional before changing the dose of your diabetic medicine. This medicine can make you more sensitive to the sun. Keep   out of the sun. If you cannot avoid being in the sun, wear protective clothing and use sunscreen. Do not use sun lamps or tanning beds/booths. What side effects may I notice from receiving this medicine? Side effects that you should  report to your doctor or health care professional as soon as possible: -allergic reactions such as skin rash or itching, hives, swelling of the lips, mouth, tongue, or throat -changes in vision -chest pain -eye pain -fast or irregular heartbeat -feeling faint or lightheaded, falls -gout attack -muscle pain or cramps -pain or difficulty when passing urine -pain, tingling, numbness in the hands or feet -redness, blistering, peeling or loosening of the skin, including inside the mouth -unusually weak or tired Side effects that usually do not require medical attention (report to your doctor or health care professional if they continue or are bothersome): -change in sex drive or performance -dry mouth -headache -stomach upset This list may not describe all possible side effects. Call your doctor for medical advice about side effects. You may report side effects to FDA at 1-800-FDA-1088. Where should I keep my medicine? Keep out of the reach of children. Store at room temperature between 15 and 30 degrees C (59 and 86 degrees F). Do not freeze. Protect from light and moisture. Keep container closed tightly. Throw away any unused medicine after the expiration date. NOTE: This sheet is a summary. It may not cover all possible information. If you have questions about this medicine, talk to your doctor, pharmacist, or health care provider.    2016, Elsevier/Gold Standard. (2009-12-25 12:57:37) Pantoprazole tablets What is this medicine? PANTOPRAZOLE (pan TOE pra zole) prevents the production of acid in the stomach. It is used to treat gastroesophageal reflux disease (GERD), inflammation of the esophagus, and Zollinger-Ellison syndrome. This medicine may be used for other purposes; ask your health care provider or pharmacist if you have questions. What should I tell my health care provider before I take this medicine? They need to know if you have any of these conditions: -liver disease -low  levels of magnesium in the blood -an unusual or allergic reaction to omeprazole, lansoprazole, pantoprazole, rabeprazole, other medicines, foods, dyes, or preservatives -pregnant or trying to get pregnant -breast-feeding How should I use this medicine? Take this medicine by mouth. Swallow the tablets whole with a drink of water. Follow the directions on the prescription label. Do not crush, break, or chew. Take your medicine at regular intervals. Do not take your medicine more often than directed. Talk to your pediatrician regarding the use of this medicine in children. While this drug may be prescribed for children as young as 5 years for selected conditions, precautions do apply. Overdosage: If you think you have taken too much of this medicine contact a poison control center or emergency room at once. NOTE: This medicine is only for you. Do not share this medicine with others. What if I miss a dose? If you miss a dose, take it as soon as you can. If it is almost time for your next dose, take only that dose. Do not take double or extra doses. What may interact with this medicine? Do not take this medicine with any of the following medications: -atazanavir -nelfinavir This medicine may also interact with the following medications: -ampicillin -delavirdine -erlotinib -iron salts -medicines for fungal infections like ketoconazole, itraconazole and voriconazole -methotrexate -mycophenolate mofetil -warfarin This list may not describe all possible interactions. Give your health care provider a list of all the medicines, herbs,  non-prescription drugs, or dietary supplements you use. Also tell them if you smoke, drink alcohol, or use illegal drugs. Some items may interact with your medicine. What should I watch for while using this medicine? It can take several days before your stomach pain gets better. Check with your doctor or health care professional if your condition does not start to get  better, or if it gets worse. You may need blood work done while you are taking this medicine. What side effects may I notice from receiving this medicine? Side effects that you should report to your doctor or health care professional as soon as possible: -allergic reactions like skin rash, itching or hives, swelling of the face, lips, or tongue -bone, muscle or joint pain -breathing problems -chest pain or chest tightness -dark yellow or brown urine -dizziness -fast, irregular heartbeat -feeling faint or lightheaded -fever or sore throat -muscle spasm -palpitations -redness, blistering, peeling or loosening of the skin, including inside the mouth -seizures -tremors -unusual bleeding or bruising -unusually weak or tired -yellowing of the eyes or skin Side effects that usually do not require medical attention (Report these to your doctor or health care professional if they continue or are bothersome.): -constipation -diarrhea -dry mouth -headache -nausea This list may not describe all possible side effects. Call your doctor for medical advice about side effects. You may report side effects to FDA at 1-800-FDA-1088. Where should I keep my medicine? Keep out of the reach of children. Store at room temperature between 15 and 30 degrees C (59 and 86 degrees F). Protect from light and moisture. Throw away any unused medicine after the expiration date. NOTE: This sheet is a summary. It may not cover all possible information. If you have questions about this medicine, talk to your doctor, pharmacist, or health care provider.    2016, Elsevier/Gold Standard. (2014-06-20 14:45:56)

## 2015-11-18 ENCOUNTER — Other Ambulatory Visit: Payer: BC Managed Care – PPO

## 2015-11-24 ENCOUNTER — Other Ambulatory Visit: Payer: Self-pay | Admitting: Nurse Practitioner

## 2015-11-24 DIAGNOSIS — R1013 Epigastric pain: Secondary | ICD-10-CM | POA: Insufficient documentation

## 2015-11-24 DIAGNOSIS — R0789 Other chest pain: Secondary | ICD-10-CM

## 2015-11-24 DIAGNOSIS — K219 Gastro-esophageal reflux disease without esophagitis: Secondary | ICD-10-CM

## 2015-11-27 ENCOUNTER — Ambulatory Visit
Admission: RE | Admit: 2015-11-27 | Discharge: 2015-11-27 | Disposition: A | Discharge status: Home / Self Care | Payer: BC Managed Care – PPO | Source: Ambulatory Visit | Attending: Nurse Practitioner | Admitting: Nurse Practitioner

## 2015-11-27 DIAGNOSIS — R1013 Epigastric pain: Secondary | ICD-10-CM

## 2015-11-27 DIAGNOSIS — R0789 Other chest pain: Secondary | ICD-10-CM

## 2015-11-27 DIAGNOSIS — K219 Gastro-esophageal reflux disease without esophagitis: Secondary | ICD-10-CM

## 2015-12-01 ENCOUNTER — Encounter: Payer: Self-pay | Admitting: *Deleted

## 2015-12-02 ENCOUNTER — Encounter: Payer: Self-pay | Admitting: *Deleted

## 2015-12-02 ENCOUNTER — Encounter
Admission: RE | Disposition: A | Discharge status: Home / Self Care | Payer: Self-pay | Source: Ambulatory Visit | Attending: Unknown Physician Specialty

## 2015-12-02 ENCOUNTER — Ambulatory Visit
Admission: RE | Admit: 2015-12-02 | Discharge: 2015-12-02 | Disposition: A | Discharge status: Home / Self Care | Payer: BC Managed Care – PPO | Source: Ambulatory Visit | Attending: Unknown Physician Specialty | Admitting: Unknown Physician Specialty

## 2015-12-02 ENCOUNTER — Ambulatory Visit: Payer: BC Managed Care – PPO | Admitting: Anesthesiology

## 2015-12-02 DIAGNOSIS — K219 Gastro-esophageal reflux disease without esophagitis: Secondary | ICD-10-CM | POA: Diagnosis not present

## 2015-12-02 DIAGNOSIS — R1013 Epigastric pain: Secondary | ICD-10-CM | POA: Insufficient documentation

## 2015-12-02 DIAGNOSIS — E669 Obesity, unspecified: Secondary | ICD-10-CM | POA: Diagnosis not present

## 2015-12-02 DIAGNOSIS — I10 Essential (primary) hypertension: Secondary | ICD-10-CM | POA: Insufficient documentation

## 2015-12-02 DIAGNOSIS — F419 Anxiety disorder, unspecified: Secondary | ICD-10-CM | POA: Insufficient documentation

## 2015-12-02 HISTORY — DX: Epigastric pain: R10.13

## 2015-12-02 HISTORY — DX: Essential (primary) hypertension: I10

## 2015-12-02 HISTORY — PX: ESOPHAGOGASTRODUODENOSCOPY (EGD) WITH PROPOFOL: SHX5813

## 2015-12-02 HISTORY — DX: Gastro-esophageal reflux disease without esophagitis: K21.9

## 2015-12-02 HISTORY — DX: Other chest pain: R07.89

## 2015-12-02 SURGERY — ESOPHAGOGASTRODUODENOSCOPY (EGD) WITH PROPOFOL
Anesthesia: General

## 2015-12-02 MED ORDER — GLYCOPYRROLATE 0.2 MG/ML IJ SOLN
INTRAMUSCULAR | Status: DC | PRN
  Administered 2015-12-02: 0.2 mg via INTRAVENOUS

## 2015-12-02 MED ORDER — LIDOCAINE HCL (PF) 2 % IJ SOLN
INTRAMUSCULAR | Status: DC | PRN
  Administered 2015-12-02: 50 mg

## 2015-12-02 MED ORDER — SODIUM CHLORIDE 0.9 % IV SOLN: INTRAVENOUS | Status: DC

## 2015-12-02 MED ORDER — MIDAZOLAM HCL 5 MG/5ML IJ SOLN
INTRAMUSCULAR | Status: DC | PRN
  Administered 2015-12-02 (×2): 1 mg via INTRAVENOUS

## 2015-12-02 MED ORDER — SODIUM CHLORIDE 0.9 % IV SOLN
INTRAVENOUS | Status: DC
  Administered 2015-12-02: 1000 mL via INTRAVENOUS

## 2015-12-02 MED ORDER — PROPOFOL 10 MG/ML IV BOLUS
INTRAVENOUS | Status: DC | PRN
  Administered 2015-12-02: 50 mg via INTRAVENOUS

## 2015-12-02 MED ORDER — FENTANYL CITRATE (PF) 100 MCG/2ML IJ SOLN
INTRAMUSCULAR | Status: DC | PRN
  Administered 2015-12-02 (×2): 50 ug via INTRAVENOUS

## 2015-12-02 MED ORDER — PROPOFOL 500 MG/50ML IV EMUL
INTRAVENOUS | Status: DC | PRN
  Administered 2015-12-02: 100 ug/kg/min via INTRAVENOUS

## 2015-12-02 NOTE — Transfer of Care (Signed)
Immediate Anesthesia Transfer of Care Note  Patient: Tami Williams  Procedure(s) Performed: Procedure(s): ESOPHAGOGASTRODUODENOSCOPY (EGD) WITH PROPOFOL (N/A)  Patient Location: PACU  Anesthesia Type:General  Level of Consciousness: sedated  Airway & Oxygen Therapy: Patient Spontanous Breathing and Patient connected to nasal cannula oxygen  Post-op Assessment: Report given to RN and Post -op Vital signs reviewed and stable  Post vital signs: Reviewed and stable  Last Vitals:  Filed Vitals:   12/02/15 1312  BP: 148/95  Pulse: 64  Temp: 36.2 C  Resp: 20    Last Pain: There were no vitals filed for this visit.       Complications: No apparent anesthesia complications

## 2015-12-02 NOTE — Anesthesia Postprocedure Evaluation (Signed)
Anesthesia Post Note  Patient: Tami CopierLisa P Williams  Procedure(s) Performed: Procedure(s) (LRB): ESOPHAGOGASTRODUODENOSCOPY (EGD) WITH PROPOFOL (N/A)  Patient location during evaluation: PACU Anesthesia Type: General Level of consciousness: awake and alert and oriented Pain management: pain level controlled Vital Signs Assessment: post-procedure vital signs reviewed and stable Respiratory status: spontaneous breathing, nonlabored ventilation and respiratory function stable Cardiovascular status: blood pressure returned to baseline and stable Postop Assessment: no signs of nausea or vomiting Anesthetic complications: no    Last Vitals:  Filed Vitals:   12/02/15 1415 12/02/15 1425  BP: 116/73 129/91  Pulse: 66 78  Temp:    Resp: 11 21    Last Pain: There were no vitals filed for this visit.               Morrill Bomkamp

## 2015-12-02 NOTE — H&P (Signed)
   Primary Care Physician:  Margaretann LovelessJennifer M Burnette, PA-C Primary Gastroenterologist:  Dr. Mechele CollinElliott  Pre-Procedure History & Physical: HPI:  Tami Williams Mcc[facility]le is a 50 y.o. female is here for an endoscopy.   Past Medical History  Diagnosis Date  . Acute epigastric pain   . GERD (gastroesophageal reflux disease)   . Hypertension   . Non-cardiac chest pain     Past Surgical History  Procedure Laterality Date  . Nissen fundoplication  2007    dr. Katrinka Blazingsmith  . Abdominal hysterectomy      due to endometriosis  . Esophagogastroduodenoscopy      Prior to Admission medications   Medication Sig Start Date End Date Taking? Authorizing Provider  fluticasone (FLONASE) 50 MCG/ACT nasal spray Place into both nostrils daily.   Yes Historical Provider, MD  hydrochlorothiazide (HYDRODIURIL) 25 MG tablet Take 1 tablet (25 mg total) by mouth daily. 11/16/15  Yes Alessandra BevelsJennifer M Burnette, PA-C  pantoprazole (PROTONIX) 40 MG tablet Take 1 tablet (40 mg total) by mouth daily. 11/16/15   Margaretann LovelessJennifer M Burnette, PA-C  sertraline (ZOLOFT) 50 MG tablet Take 2 tablets (100 mg total) by mouth daily. 11/12/15   Lorie PhenixNancy Maloney, MD  SYNTHROID 25 MCG tablet TK 1 T PO QD 08/03/15   Historical Provider, MD    Allergies as of 11/30/2015  . (No Known Allergies)    Family History  Problem Relation Age of Onset  . Hypertension Mother   . Heart failure Mother     Defib & pacemaker implant   . Heart disease Mother   . Hypertension Father     Social History   Social History  . Marital Status: Married    Spouse Name: N/A  . Number of Children: N/A  . Years of Education: N/A   Occupational History  . Not on file.   Social History Main Topics  . Smoking status: Never Smoker   . Smokeless tobacco: Never Used  . Alcohol Use: 0.0 oz/week    0 Standard drinks or equivalent per week     Comment: occasional  . Drug Use: No  . Sexual Activity: Not on file   Other Topics Concern  . Not on file   Social History Narrative     Review of Systems: See HPI, otherwise negative ROS  Physical Exam: BP 148/95 mmHg  Pulse 64  Temp(Src) 97.2 F (36.2 C) (Tympanic)  Resp 20  Ht 5\' 4"  (1.626 m)  SpO2 100% General:   Alert,  pleasant and cooperative in NAD Head:  Normocephalic and atraumatic. Neck:  Supple; no masses or thyromegaly. Lungs:  Clear throughout to auscultation.    Heart:  Regular rate and rhythm. Abdomen:  Soft, nontender and nondistended. Normal bowel sounds, without guarding, and without rebound.   Neurologic:  Alert and  oriented x4;  grossly normal neurologically.  Impression/Plan: Tami Williams [facility] is here for an endoscopy to be performed for epigastric abd pain  Risks, benefits, limitations, and alternatives regarding  endoscopy have been reviewed with the patient.  Questions have been answered.  All parties agreeable.   Lynnae PrudeELLIOTT, ROBERT, MD  12/02/2015, 1:50 PM

## 2015-12-02 NOTE — Op Note (Signed)
[facility] Patient Name: Tami BrooklynLisa Williams Procedure Date: 12/02/2015 1:47 PM MRN: [id number] Account #: [id number] Date of Birth: 10/15/1965 Admit Type: Outpatient Age: [age] [facility] ROOM 4 Gender: Female Note Status: Finalized Procedure:            Upper GI endoscopy Indications:          Epigastric abdominal pain Providers:            Scot Junobert T. Etheridge Geil, MD Referring MD:         Margaretann LovelessJennifer M. Burnette (Referring MD) Medicines:            Propofol per Anesthesia Complications:        No immediate complications. Procedure:            Pre-Anesthesia Assessment:                       - After reviewing the risks and benefits, the patient                        was deemed in satisfactory condition to undergo the                        procedure.                       After obtaining informed consent, the endoscope was                        passed under direct vision. Throughout the procedure,                        the patient's blood pressure, pulse, and oxygen                        saturations were monitored continuously. The Endoscope                        was introduced through the mouth, and advanced to the                        second part of duodenum. The upper GI endoscopy was                        accomplished without difficulty. The patient tolerated                        the procedure well. Findings:      A prior Nissen fundoplication was found in the cardia. Small area of       shelf like area next to the wrap seen in retro and forward position.       Reviewed wih Dr. Marva PandaSkulskie and it looks like the wrap is partly undone.      The entire examined stomach was otherwise normal.      The examined duodenum was normal. Impression:           - A Nissen fundoplication was found.                       - Normal stomach.                       -  Normal examined duodenum.                       - No specimens collected. Recommendation:        Refer to med center for 2ed opinion.                       - The findings and recommendations were discussed with                        the patient's family. Scot Jun, MD 12/02/2015 2:09:52 PM This report has been signed electronically. Number of Addenda: 0 Note Initiated On: 12/02/2015 1:47 PM      [facility]

## 2015-12-02 NOTE — Anesthesia Preprocedure Evaluation (Addendum)
Anesthesia Evaluation  Patient identified by MRN, date of birth, ID band Patient awake    Reviewed: Allergy & Precautions, NPO status , Patient's Chart, lab work & pertinent test results  History of Anesthesia Complications Negative for: history of anesthetic complications  Airway Mallampati: II  TM Distance: >3 FB Neck ROM: Full    Dental no notable dental hx.    Pulmonary neg COPD,    breath sounds clear to auscultation- rhonchi (-) wheezing      Cardiovascular Exercise Tolerance: Good hypertension, Pt. on medications (-) CAD and (-) Past MI  Rhythm:Regular Rate:Normal - Systolic murmurs and - Diastolic murmurs    Neuro/Psych Anxiety    GI/Hepatic Neg liver ROS, hiatal hernia (hx of repair), GERD  Medicated,  Endo/Other  neg diabetesHypothyroidism (on replacement)   Renal/GU negative Renal ROS     Musculoskeletal negative musculoskeletal ROS (+)   Abdominal (+) - obese,   Peds  Hematology negative hematology ROS (+)   Anesthesia Other Findings   Reproductive/Obstetrics negative OB ROS                            Anesthesia Physical Anesthesia Plan  ASA: II  Anesthesia Plan: General   Post-op Pain Management:    Induction: Intravenous  Airway Management Planned: Natural Airway  Additional Equipment:   Intra-op Plan:   Post-operative Plan:   Informed Consent: I have reviewed the patients History and Physical, chart, labs and discussed the procedure including the risks, benefits and alternatives for the proposed anesthesia with the patient or authorized representative who has indicated his/her understanding and acceptance.   Dental advisory given  Plan Discussed with:   Anesthesia Plan Comments:         Anesthesia Quick Evaluation

## 2015-12-03 ENCOUNTER — Encounter: Payer: Self-pay | Admitting: Unknown Physician Specialty

## 2015-12-09 ENCOUNTER — Ambulatory Visit: Payer: BC Managed Care – PPO | Admitting: Cardiology

## 2015-12-14 ENCOUNTER — Ambulatory Visit: Payer: BC Managed Care – PPO | Admitting: Physician Assistant

## 2015-12-17 ENCOUNTER — Encounter: Payer: Self-pay | Admitting: Physician Assistant

## 2015-12-17 ENCOUNTER — Ambulatory Visit (INDEPENDENT_AMBULATORY_CARE_PROVIDER_SITE_OTHER): Payer: BC Managed Care – PPO | Admitting: Physician Assistant

## 2015-12-17 VITALS — BP 160/90 | HR 63 | Temp 97.7°F | Resp 16 | Wt 129.8 lb

## 2015-12-17 DIAGNOSIS — I10 Essential (primary) hypertension: Secondary | ICD-10-CM

## 2015-12-17 DIAGNOSIS — K449 Diaphragmatic hernia without obstruction or gangrene: Secondary | ICD-10-CM | POA: Diagnosis not present

## 2015-12-17 DIAGNOSIS — F411 Generalized anxiety disorder: Secondary | ICD-10-CM

## 2015-12-17 DIAGNOSIS — K219 Gastro-esophageal reflux disease without esophagitis: Secondary | ICD-10-CM

## 2015-12-17 MED ORDER — PANTOPRAZOLE SODIUM 40 MG PO TBEC: 40.0000 mg | DELAYED_RELEASE_TABLET | Freq: Two times a day (BID) | ORAL | 1 refills | Status: DC

## 2015-12-17 MED ORDER — LISINOPRIL 10 MG PO TABS: 10.0000 mg | ORAL_TABLET | Freq: Every day | ORAL | 1 refills | Status: DC

## 2015-12-17 MED ORDER — PAROXETINE HCL 10 MG PO TABS: 10.0000 mg | ORAL_TABLET | Freq: Every day | ORAL | 1 refills | Status: DC

## 2015-12-17 NOTE — Patient Instructions (Signed)
Lisinopril tablets What is this medicine? LISINOPRIL (lyse IN oh pril) is an ACE inhibitor. This medicine is used to treat high blood pressure and heart failure. It is also used to protect the heart immediately after a heart attack. This medicine may be used for other purposes; ask your health care provider or pharmacist if you have questions. What should I tell my health care provider before I take this medicine? They need to know if you have any of these conditions: -diabetes -heart or blood vessel disease -kidney disease -low blood pressure -previous swelling of the tongue, face, or lips with difficulty breathing, difficulty swallowing, hoarseness, or tightening of the throat -an unusual or allergic reaction to lisinopril, other ACE inhibitors, insect venom, foods, dyes, or preservatives -pregnant or trying to get pregnant -breast-feeding How should I use this medicine? Take this medicine by mouth with a glass of water. Follow the directions on your prescription label. You may take this medicine with or without food. If it upsets your stomach, take it with food. Take your medicine at regular intervals. Do not take it more often than directed. Do not stop taking except on your doctor's advice. Talk to your pediatrician regarding the use of this medicine in children. Special care may be needed. While this drug may be prescribed for children as young as 6 years of age for selected conditions, precautions do apply. Overdosage: If you think you have taken too much of this medicine contact a poison control center or emergency room at once. NOTE: This medicine is only for you. Do not share this medicine with others. What if I miss a dose? If you miss a dose, take it as soon as you can. If it is almost time for your next dose, take only that dose. Do not take double or extra doses. What may interact with this medicine? Do not take this medicine with any of the following medications: -hymenoptera  venomThis medicines may also interact with the following medications: -aliskiren -angiotensin receptor blockers, like losartan or valsartan -certain medicines for diabetes -diuretics -everolimus -gold compounds -lithium -NSAIDs, medicines for pain and inflammation, like ibuprofen or naproxen -potassium salts or supplements -salt substitutes -sirolimus -temsirolimus This list may not describe all possible interactions. Give your health care provider a list of all the medicines, herbs, non-prescription drugs, or dietary supplements you use. Also tell them if you smoke, drink alcohol, or use illegal drugs. Some items may interact with your medicine. What should I watch for while using this medicine? Visit your doctor or health care professional for regular check ups. Check your blood pressure as directed. Ask your doctor what your blood pressure should be, and when you should contact him or her. Do not treat yourself for coughs, colds, or pain while you are using this medicine without asking your doctor or health care professional for advice. Some ingredients may increase your blood pressure. Women should inform their doctor if they wish to become pregnant or think they might be pregnant. There is a potential for serious side effects to an unborn child. Talk to your health care professional or pharmacist for more information. Check with your doctor or health care professional if you get an attack of severe diarrhea, nausea and vomiting, or if you sweat a lot. The loss of too much body fluid can make it dangerous for you to take this medicine. You may get drowsy or dizzy. Do not drive, use machinery, or do anything that needs mental alertness until you know how   this drug affects you. Do not stand or sit up quickly, especially if you are an older patient. This reduces the risk of dizzy or fainting spells. Alcohol can make you more drowsy and dizzy. Avoid alcoholic drinks. Avoid salt substitutes unless  you are told otherwise by your doctor or health care professional. What side effects may I notice from receiving this medicine? Side effects that you should report to your doctor or health care professional as soon as possible: -allergic reactions like skin rash, itching or hives, swelling of the hands, feet, face, lips, throat, or tongue -breathing problems -signs and symptoms of kidney injury like trouble passing urine or change in the amount of urine -signs and symptoms of increased potassium like muscle weakness; chest pain; or fast, irregular heartbeat -signs and symptoms of liver injury like dark yellow or brown urine; general ill feeling or flu-like symptoms; light-colored stools; loss of appetite; nausea; right upper belly pain; unusually weak or tired; yellowing of the eyes or skin -signs and symptoms of low blood pressure like dizziness; feeling faint or lightheaded, falls; unusually weak or tired -stomach pain with or without nausea and vomiting Side effects that usually do not require medical attention (report to your doctor or health care professional if they continue or are bothersome): -changes in taste -cough -dizziness -fever -headache -sensitivity to light This list may not describe all possible side effects. Call your doctor for medical advice about side effects. You may report side effects to FDA at 1-800-FDA-1088. Where should I keep my medicine? Keep out of the reach of children. Store at room temperature between 15 and 30 degrees C (59 and 86 degrees F). Protect from moisture. Keep container tightly closed. Throw away any unused medicine after the expiration date. NOTE: This sheet is a summary. It may not cover all possible information. If you have questions about this medicine, talk to your doctor, pharmacist, or health care provider.    2016, Elsevier/Gold Standard. (2014-12-25 20:38:20) Paroxetine tablets What is this medicine? PAROXETINE (pa ROX e teen) is used  to treat depression. It may also be used to treat anxiety disorders, obsessive compulsive disorder, panic attacks, post traumatic stress, and premenstrual dysphoric disorder (PMDD). This medicine may be used for other purposes; ask your health care provider or pharmacist if you have questions. What should I tell my health care provider before I take this medicine? They need to know if you have any of these conditions: -bleeding disorders -glaucoma -heart disease -kidney disease -liver disease -low levels of sodium in the blood -mania or bipolar disorder -seizures -suicidal thoughts, plans, or attempt; a previous suicide attempt by you or a family member -take MAOIs like Carbex, Eldepryl, Marplan, Nardil, and Parnate -take medicines that treat or prevent blood clots -an unusual or allergic reaction to paroxetine, other medicines, foods, dyes, or preservatives -pregnant or trying to get pregnant -breast-feeding How should I use this medicine? Take this medicine by mouth with a glass of water. Follow the directions on the prescription label. You can take it with or without food. Take your medicine at regular intervals. Do not take your medicine more often than directed. Do not stop taking this medicine suddenly except upon the advice of your doctor. Stopping this medicine too quickly may cause serious side effects or your condition may worsen. A special MedGuide will be given to you by the pharmacist with each prescription and refill. Be sure to read this information carefully each time. Talk to your pediatrician regarding the use  of this medicine in children. Special care may be needed. Overdosage: If you think you have taken too much of this medicine contact a poison control center or emergency room at once. NOTE: This medicine is only for you. Do not share this medicine with others. What if I miss a dose? If you miss a dose, take it as soon as you can. If it is almost time for your next  dose, take only that dose. Do not take double or extra doses. What may interact with this medicine? Do not take this medicine with any of the following medications: -linezolid -MAOIs like Carbex, Eldepryl, Marplan, Nardil, and Parnate -methylene blue (injected into a vein) -pimozide -thioridazine This medicine may also interact with the following medications: -alcohol -aspirin and aspirin-like medicines -atomoxetine -certain medicines for depression, anxiety, or psychotic disturbances -certain medicines for irregular heart beat like propafenone, flecainide, encainide, and quinidine -certain medicines for migraine headache like almotriptan, eletriptan, frovatriptan, naratriptan, rizatriptan, sumatriptan, zolmitriptan -cimetidine -digoxin -diuretics -fentanyl -fosamprenavir/ritonavir -furazolidone -isoniazid -lithium -medicines that treat or prevent blood clots like warfarin, enoxaparin, and dalteparin -medicines for sleep -metoprolol -NSAIDs, medicines for pain and inflammation, like ibuprofen or naproxen -phenobarbital -phenytoin -procarbazine -procyclidine -rasagiline -supplements like St. John's wort, kava kava, valerian -tamoxifen -theophylline -tramadol -tryptophan This list may not describe all possible interactions. Give your health care provider a list of all the medicines, herbs, non-prescription drugs, or dietary supplements you use. Also tell them if you smoke, drink alcohol, or use illegal drugs. Some items may interact with your medicine. What should I watch for while using this medicine? Tell your doctor if your symptoms do not get better or if they get worse. Visit your doctor or health care professional for regular checks on your progress. Because it may take several weeks to see the full effects of this medicine, it is important to continue your treatment as prescribed by your doctor. Patients and their families should watch out for new or worsening thoughts of  suicide or depression. Also watch out for sudden changes in feelings such as feeling anxious, agitated, panicky, irritable, hostile, aggressive, impulsive, severely restless, overly excited and hyperactive, or not being able to sleep. If this happens, especially at the beginning of treatment or after a change in dose, call your health care professional. Bonita Quin may get drowsy or dizzy. Do not drive, use machinery, or do anything that needs mental alertness until you know how this medicine affects you. Do not stand or sit up quickly, especially if you are an older patient. This reduces the risk of dizzy or fainting spells. Alcohol may interfere with the effect of this medicine. Avoid alcoholic drinks. Your mouth may get dry. Chewing sugarless gum or sucking hard candy, and drinking plenty of water will help. Contact your doctor if the problem does not go away or is severe. What side effects may I notice from receiving this medicine? Side effects that you should report to your doctor or health care professional as soon as possible: -allergic reactions like skin rash, itching or hives, swelling of the face, lips, or tongue -black or bloody stools, blood in the urine or vomit -fast, irregular heartbeat -hallucination, loss of contact with reality -painful or prolonged erection (men) -seizures -suicidal thoughts or other mood changes -trouble passing urine or change in the amount of urine -unusual bleeding or bruising -unusually weak or tired -vomiting Side effects that usually do not require medical attention (report to your doctor or health care professional if they continue or  are bothersome): -change in appetite, weight -change in sex drive or performance -constipation or diarrhea -difficulty sleeping -drowsy -headache -increased sweating -muscle pain or weakness -tremors This list may not describe all possible side effects. Call your doctor for medical advice about side effects. You may report  side effects to FDA at 1-800-FDA-1088. Where should I keep my medicine? Keep out of the reach of children. Store at room temperature between 15 and 30 degrees C (59 and 86 degrees F). Keep container tightly closed. Throw away any unused medicine after the expiration date. NOTE: This sheet is a summary. It may not cover all possible information. If you have questions about this medicine, talk to your doctor, pharmacist, or health care provider.    2016, Elsevier/Gold Standard. (2011-12-15 18:10:02)

## 2015-12-17 NOTE — Progress Notes (Signed)
Patient: Tami Williams Female    DOB: 08-04-1965   50 y.o.   MRN: [id number] Visit Date: 12/17/2015  Today's Provider: Margaretann Loveless, PA-C   No chief complaint on file.  Subjective:    HPI  Hypertension, follow-up:  BP Readings from Last 3 Encounters:  12/17/15 (!) 160/90  12/02/15 (!) 151/100  11/16/15 (!) 150/90    She was last seen for hypertension 4 weeks ago.  BP at that visit was 150/90. Management since that visit includes started on HCTZ 25 Mg She reports excellent compliance with treatment. She is not having side effects.  She is exercising. She is adherent to low salt diet.   Outside blood pressures are 150's and 90's. She is experiencing none.  Patient denies chest pain, chest pressure/discomfort, exertional chest pressure/discomfort, fatigue, irregular heart beat, lower extremity edema and palpitations.    Weight trend: stable Wt Readings from Last 3 Encounters:  12/17/15 129 lb 12.8 oz (58.9 kg)  11/16/15 131 lb (59.4 kg)  11/05/15 127 lb 8 oz (57.8 kg)    Current diet: in general, a "healthy" diet    ------------------------------------------------------------------------  GERD: Patient is here to follow-up GERD. She was prescribed Protonix 40 MG.She was refered to GI for further evaluation. She reports that she had an [place]. She went to see Dr. Markham Jordan and was referred to a surgeon in [place] but states didn't go to well. She is going to see a Careers adviser in town next Wednesday at 9:15 am. She is taking the Protonix and reports Dr. Markham Jordan advised patient to take 2 a day.  She also reports she stopped Zoloft 3 days ago. She reports that it just made her feel unbalanced and tired. Made her feel "weird".     No Known Allergies Current Meds  Medication Sig  . fluticasone (FLONASE) 50 MCG/ACT nasal spray Place into both nostrils daily.  . hydrochlorothiazide (HYDRODIURIL) 25 MG tablet Take 1 tablet (25 mg total) by mouth daily.  . pantoprazole  (PROTONIX) 40 MG tablet Take 1 tablet (40 mg total) by mouth daily.  Marland Kitchen SYNTHROID 25 MCG tablet TK 1 T PO QD    Review of Systems  Constitutional: Negative.   Eyes: Negative for visual disturbance.  Respiratory: Negative.   Cardiovascular: Positive for chest pain (epigastric from undone Nissen fundoplication). Negative for palpitations and leg swelling.  Gastrointestinal: Negative for abdominal pain, blood in stool, constipation, diarrhea and nausea.  Neurological: Negative for dizziness and headaches.    Social History  Substance Use Topics  . Smoking status: Never Smoker  . Smokeless tobacco: Never Used  . Alcohol use 0.0 oz/week     Comment: occasional   Objective:   BP (!) 160/90 (BP Location: Right Arm, Patient Position: Sitting, Cuff Size: Normal)   Pulse 63   Temp 97.7 F (36.5 C) (Oral)   Resp 16   Wt 129 lb 12.8 oz (58.9 kg)   BMI 22.28 kg/m   Physical Exam  Constitutional: She appears well-developed and well-nourished. No distress.  Cardiovascular: Normal rate, regular rhythm and normal heart sounds.  Exam reveals no gallop and no friction rub.   No murmur heard. Pulmonary/Chest: Effort normal and breath sounds normal. No respiratory distress. She has no wheezes. She has no rales.  Skin: She is not diaphoretic.  Psychiatric: She has a normal mood and affect. Her behavior is normal. Judgment and thought content normal.  Vitals reviewed.      Assessment & Plan:  1. Gastro-esophageal reflux disease without esophagitis Dr. Mechele Collin had increased to BID for time being until surgery. Rx updated and refilled as below. - pantoprazole (PROTONIX) 40 MG tablet; Take 1 tablet (40 mg total) by mouth 2 (two) times daily.  Dispense: 60 tablet; Refill: 1  2. Diaphragmatic hernia without obstruction and without gangrene Scheduled to see general surgeon in [place] next Weds and surgery scheduled for 01/29/16 for repair of Nissen fundoplication that has come undone. -  pantoprazole (PROTONIX) 40 MG tablet; Take 1 tablet (40 mg total) by mouth 2 (two) times daily.  Dispense: 60 tablet; Refill: 1  3. GAD (generalized anxiety disorder) Has failed lexapro (daytime somnolence) and zoloft (lightheaded and insomnia). Is having mild hot flashes as well with menopausal changes. Will try paxil as below. She is to call if any adverse reaction. If not I will see her back in 4 weeks to re-evaluate. - PARoxetine (PAXIL) 10 MG tablet; Take 1 tablet (10 mg total) by mouth daily.  Dispense: 30 tablet; Refill: 1  4. Essential hypertension Will add lisinopril to HCTZ treatment and will recheck BP in 4 weeks. - lisinopril (PRINIVIL,ZESTRIL) 10 MG tablet; Take 1 tablet (10 mg total) by mouth daily.  Dispense: 30 tablet; Refill: 1       Margaretann Loveless, PA-C  [facility]

## 2015-12-23 ENCOUNTER — Encounter: Payer: Self-pay | Admitting: General Surgery

## 2015-12-23 ENCOUNTER — Ambulatory Visit (INDEPENDENT_AMBULATORY_CARE_PROVIDER_SITE_OTHER): Payer: BC Managed Care – PPO | Admitting: General Surgery

## 2015-12-23 VITALS — BP 143/85 | HR 73 | Temp 97.9°F | Ht 64.0 in | Wt 127.6 lb

## 2015-12-23 DIAGNOSIS — R0789 Other chest pain: Secondary | ICD-10-CM

## 2015-12-23 NOTE — Consult Note (Deleted)
Patient ID: Tami Williams, female   DOB: 12/29/1965, 50 y.o.   MRN: [id number]  CC: Chest/Epigastric pain  HPI Tami Williams is a 50 y.o. female presents to clinic today for evaluation of chest pain and midepigastric pain. Patient reports that the pain started a couple months ago and is very similar to the pain she had prior to undergoing a Nissen fundoplication in 2007. At the same time she was found to have hypertensive urgency and was required to the emergency department for evaluation of her blood pressure. Secondary to the chest pain and severely elevated blood pressure she underwent a complete cardiac workup. The cardiac workup did not find any evidence of cardiac ischemia and she was started on blood pressure medication with satisfactory results. She continued to have the chest midepigastric pain and underwent a GI workup over the similar time period. She had a right upper quadrant ultrasound, a barium swallow, and an upper endoscopy. The ultrasound and barium swallow did not show any pathology however the upper endoscopy showed what appeared to be a partially slipped wrap from her fundoplication. She was restarted on antiacid medication with some relief of her complaints. She denies any current fevers, chills, nausea, vomiting, diarrhea, constipation, shortness of breath. She denies any other changes in her medications, sick contacts, life stressors. She presents today to discuss whether not she should have surgery for her "slipped Nissen".  HPI  Past Medical History:  Diagnosis Date  . Acute epigastric pain   . GAD (generalized anxiety disorder)   . GERD (gastroesophageal reflux disease)   . Hypertension   . Non-cardiac chest pain     Past Surgical History:  Procedure Laterality Date  . ABDOMINAL HYSTERECTOMY     due to endometriosis  . ESOPHAGOGASTRODUODENOSCOPY    . ESOPHAGOGASTRODUODENOSCOPY (EGD) WITH PROPOFOL N/A 12/02/2015   Procedure: ESOPHAGOGASTRODUODENOSCOPY (EGD) WITH  PROPOFOL;  Surgeon: Robert T Elliott, MD;  Location: [facility] ENDOSCOPY;  Service: Endoscopy;  Laterality: N/A;  . NISSEN FUNDOPLICATION  2007   dr. smith    Family History  Problem Relation Age of Onset  . Hypertension Mother   . Heart failure Mother     Defib & pacemaker implant   . Heart disease Mother   . Hypertension Father     Social History Social History  Substance Use Topics  . Smoking status: Never Smoker  . Smokeless tobacco: Never Used  . Alcohol use 0.0 oz/week     Comment: occasional    No Known Allergies  Current Outpatient Prescriptions  Medication Sig Dispense Refill  . fluticasone (FLONASE) 50 MCG/ACT nasal spray Place into both nostrils daily.    . lisinopril (PRINIVIL,ZESTRIL) 10 MG tablet Take 1 tablet (10 mg total) by mouth daily. 30 tablet 1  . pantoprazole (PROTONIX) 40 MG tablet Take 1 tablet (40 mg total) by mouth 2 (two) times daily. 60 tablet 1  . PARoxetine (PAXIL) 10 MG tablet Take 1 tablet (10 mg total) by mouth daily. 30 tablet 1  . SYNTHROID 25 MCG tablet TK 1 T PO QD  6   No current facility-administered medications for this visit.      Review of Systems A Multi-point review of systems was asked and was negative except for the findings documented in the history of present illness  Physical Exam Blood pressure (!) 143/85, pulse 73, temperature 97.9 F (36.6 C), temperature source Oral, height 5' 4" (1.626 m), weight 57.9 kg (127 lb 9.6 oz). CONSTITUTIONAL: No acute distress. EYES: Pupils   are equal, round, and reactive to light, Sclera are non-icteric. EARS, NOSE, MOUTH AND THROAT: The oropharynx is clear. The oral mucosa is pink and moist. Hearing is intact to voice. LYMPH NODES:  Lymph nodes in the neck are normal. RESPIRATORY:  Lungs are clear. There is normal respiratory effort, with equal breath sounds bilaterally, and without pathologic use of accessory muscles. CARDIOVASCULAR: Heart is regular without murmurs, gallops, or rubs. GI:  The abdomen is soft, nontender, and nondistended. There are no palpable masses. There is no hepatosplenomegaly. There are normal bowel sounds in all quadrants. Multiple well-healed laparoscopic incisions the abdomen including 2 by the umbilicus and a 5 in the upper abdomen including upper quadrant. No evidence of peritoneal signs or guarding on exam. GU: Rectal deferred.   MUSCULOSKELETAL: Normal muscle strength and tone. No cyanosis or edema.   SKIN: Turgor is good and there are no pathologic skin lesions or ulcers. NEUROLOGIC: Motor and sensation is grossly normal. Cranial nerves are grossly intact. PSYCH:  Oriented to person, place and time. Affect is normal.  Data Reviewed Patient's multiple imaging modalities reviewed. Ultrasound without any obvious hepatobiliary disease with no evidence of gallstones or ductal dilatation.. Swallow is normal and shows evidence of the Nissen wrap that is present within the abdominal cavity. No evidence of hiatal hernia seen on barium swallow. Images from the upper endoscopy reviewed which show evidence of a fundoplication wrap however you are able to see around the wrap concerning for a partial slippage. No evidence of esophagitis seen on the endoscopy. I have personally reviewed the patient's imaging, laboratory findings and medical records.    Assessment    Midepigastric and lower chest, noncardiac pain.    Plan    50-year-old female with a history consistent with recurrent gastroesophageal reflux disease. She is 10 years out from an antireflux surgery that provided her good relief from the symptoms. She has had improvement in symptoms with the reinitiation of acid suppressing medications. She is here today to discuss potential redo surgery for her fundoplication. She has not undergone any additional testing to prove whether or not she is having reflux. The multiple modalities for testing for reflux were discussed in detail. Patient is very anxious about the  workup and the possible risks involved with redo surgery after they were discussed. After a prolonged conversation about what the surgery could potentially entail the patient agreed to undergo esophageal manometry with impedance to prove whether or not her symptoms are related to reflux or not. After this test is done we will see her back in clinic today discuss whether or not she is actually interested in surgical intervention and whether or not it is appropriate to do redo reflux surgery in this facility. All questions transferred to the patient and her spouse his satisfaction. She'll follow-up in clinic 1-2 weeks after her manometry study     Time spent with the patient was 60 minutes, with more than 50% of the time spent in face-to-face education, counseling and care coordination.     Hailey Miles, MD FACS General Surgeon 12/23/2015, 10:57 AM    

## 2015-12-23 NOTE — Patient Instructions (Addendum)
We will need to obtain a PH and Manometry test completed. This has been scheduled for 01/06/16 at [facility]. You will come to the 2nd floor of the Medical mall. You must STOP taking your Pantoprazole for 2 days prior to testing or your testing will have to be repeated. Please see the additional information provided.  We will have you follow-up in the office with Dr. Tonita CongWoodham on 01/19/16 at 3pm in [place].

## 2015-12-23 NOTE — Progress Notes (Signed)
Patient ID: Tami Williams, female   DOB: 01/11/1966, 50 y.o.   MRN: [id number]  CC: Chest/Epigastric pain  HPI Tami Williams is a 50 y.o. female presents to clinic today for evaluation of chest pain and midepigastric pain. Patient reports that the pain started a couple months ago and is very similar to the pain she had prior to undergoing a Nissen fundoplication in 2007. At the same time she was found to have hypertensive urgency and was required to the emergency department for evaluation of her blood pressure. Secondary to the chest pain and severely elevated blood pressure she underwent a complete cardiac workup. The cardiac workup did not find any evidence of cardiac ischemia and she was started on blood pressure medication with satisfactory results. She continued to have the chest midepigastric pain and underwent a GI workup over the similar time period. She had a right upper quadrant ultrasound, a barium swallow, and an upper endoscopy. The ultrasound and barium swallow did not show any pathology however the upper endoscopy showed what appeared to be a partially slipped wrap from her fundoplication. She was restarted on antiacid medication with some relief of her complaints. She denies any current fevers, chills, nausea, vomiting, diarrhea, constipation, shortness of breath. She denies any other changes in her medications, sick contacts, life stressors. She presents today to discuss whether not she should have surgery for her "slipped Nissen".  HPI  Past Medical History:  Diagnosis Date  . Acute epigastric pain   . GAD (generalized anxiety disorder)   . GERD (gastroesophageal reflux disease)   . Hypertension   . Non-cardiac chest pain     Past Surgical History:  Procedure Laterality Date  . ABDOMINAL HYSTERECTOMY     due to endometriosis  . ESOPHAGOGASTRODUODENOSCOPY    . ESOPHAGOGASTRODUODENOSCOPY (EGD) WITH PROPOFOL N/A 12/02/2015   Procedure: ESOPHAGOGASTRODUODENOSCOPY (EGD) WITH  PROPOFOL;  Surgeon: Scot Jun, MD;  Location: [facility] ENDOSCOPY;  Service: Endoscopy;  Laterality: N/A;  . NISSEN FUNDOPLICATION  2007   dr. Katrinka Blazing    Family History  Problem Relation Age of Onset  . Hypertension Mother   . Heart failure Mother     Defib & pacemaker implant   . Heart disease Mother   . Hypertension Father     Social History Social History  Substance Use Topics  . Smoking status: Never Smoker  . Smokeless tobacco: Never Used  . Alcohol use 0.0 oz/week     Comment: occasional    No Known Allergies  Current Outpatient Prescriptions  Medication Sig Dispense Refill  . fluticasone (FLONASE) 50 MCG/ACT nasal spray Place into both nostrils daily.    Marland Kitchen lisinopril (PRINIVIL,ZESTRIL) 10 MG tablet Take 1 tablet (10 mg total) by mouth daily. 30 tablet 1  . pantoprazole (PROTONIX) 40 MG tablet Take 1 tablet (40 mg total) by mouth 2 (two) times daily. 60 tablet 1  . PARoxetine (PAXIL) 10 MG tablet Take 1 tablet (10 mg total) by mouth daily. 30 tablet 1  . SYNTHROID 25 MCG tablet TK 1 T PO QD  6   No current facility-administered medications for this visit.      Review of Systems A Multi-point review of systems was asked and was negative except for the findings documented in the history of present illness  Physical Exam Blood pressure (!) 143/85, pulse 73, temperature 97.9 F (36.6 C), temperature source Oral, height  (1.626 m), weight 57.9 kg (127 lb 9.6 oz). CONSTITUTIONAL: No acute distress. EYES: Pupils  are equal, round, and reactive to light, Sclera are non-icteric. EARS, NOSE, MOUTH AND THROAT: The oropharynx is clear. The oral mucosa is pink and moist. Hearing is intact to voice. LYMPH NODES:  Lymph nodes in the neck are normal. RESPIRATORY:  Lungs are clear. There is normal respiratory effort, with equal breath sounds bilaterally, and without pathologic use of accessory muscles. CARDIOVASCULAR: Heart is regular without murmurs, gallops, or rubs. GI:  The abdomen is soft, nontender, and nondistended. There are no palpable masses. There is no hepatosplenomegaly. There are normal bowel sounds in all quadrants. Multiple well-healed laparoscopic incisions the abdomen including 2 by the umbilicus and a 5 in the upper abdomen including upper quadrant. No evidence of peritoneal signs or guarding on exam. GU: Rectal deferred.   MUSCULOSKELETAL: Normal muscle strength and tone. No cyanosis or edema.   SKIN: Turgor is good and there are no pathologic skin lesions or ulcers. NEUROLOGIC: Motor and sensation is grossly normal. Cranial nerves are grossly intact. PSYCH:  Oriented to person, place and time. Affect is normal.  Data Reviewed Patient's multiple imaging modalities reviewed. Ultrasound without any obvious hepatobiliary disease with no evidence of gallstones or ductal dilatation.Tenna Child. Swallow is normal and shows evidence of the Nissen wrap that is present within the abdominal cavity. No evidence of hiatal hernia seen on barium swallow. Images from the upper endoscopy reviewed which show evidence of a fundoplication wrap however you are able to see around the wrap concerning for a partial slippage. No evidence of esophagitis seen on the endoscopy. I have personally reviewed the patient's imaging, laboratory findings and medical records.    Assessment    Midepigastric and lower chest, noncardiac pain.    Plan    50 year old female with a history consistent with recurrent gastroesophageal reflux disease. She is 10 years out from an antireflux surgery that provided her good relief from the symptoms. She has had improvement in symptoms with the reinitiation of acid suppressing medications. She is here today to discuss potential redo surgery for her fundoplication. She has not undergone any additional testing to prove whether or not she is having reflux. The multiple modalities for testing for reflux were discussed in detail. Patient is very anxious about the  workup and the possible risks involved with redo surgery after they were discussed. After a prolonged conversation about what the surgery could potentially entail the patient agreed to undergo esophageal manometry with impedance to prove whether or not her symptoms are related to reflux or not. After this test is done we will see her back in clinic today discuss whether or not she is actually interested in surgical intervention and whether or not it is appropriate to do redo reflux surgery in this facility. All questions transferred to the patient and her spouse his satisfaction. She'll follow-up in clinic 1-2 weeks after her manometry study     Time spent with the patient was 60 minutes, with more than 50% of the time spent in face-to-face education, counseling and care coordination.     Ricarda Frameharles Kristy Schomburg, MD FACS General Surgeon 12/23/2015, 10:57 AM

## 2016-01-01 ENCOUNTER — Telehealth: Payer: Self-pay

## 2016-01-01 DIAGNOSIS — F419 Anxiety disorder, unspecified: Secondary | ICD-10-CM

## 2016-01-01 MED ORDER — PAROXETINE HCL 20 MG PO TABS: 20.0000 mg | ORAL_TABLET | Freq: Every day | ORAL | 3 refills | Status: DC

## 2016-01-01 NOTE — Telephone Encounter (Signed)
Pt advised.   Thanks,   -Kabe Mckoy  

## 2016-01-01 NOTE — Telephone Encounter (Signed)
Pt called to request an increase of her Paxil.  She reports her anxiety is improving and she is tolerating her medicine well but she is not to baseline yet.  She was also wondering if this was the same medication to help with menopause symptoms as well.  Please advised.   Contact number:  [phone]  Thanks,   -Vernona RiegerLaura

## 2016-01-01 NOTE — Telephone Encounter (Signed)
Increase dose to 20mg  daily and yes this is same medicine that is in [facility] for non hormonal treatment of menopausal symptoms.

## 2016-01-01 NOTE — Telephone Encounter (Signed)
lmtcb Margues Filippini Drozdowski, CMA  

## 2016-01-01 NOTE — Telephone Encounter (Signed)
Okay to increase? Allene DillonEmily Drozdowski, CMA

## 2016-01-06 ENCOUNTER — Encounter
Admission: RE | Disposition: A | Discharge status: Home / Self Care | Payer: Self-pay | Source: Ambulatory Visit | Attending: Gastroenterology

## 2016-01-06 ENCOUNTER — Ambulatory Visit
Admission: RE | Admit: 2016-01-06 | Discharge: 2016-01-06 | Disposition: A | Discharge status: Home / Self Care | Payer: BC Managed Care – PPO | Source: Ambulatory Visit | Attending: Gastroenterology | Admitting: Gastroenterology

## 2016-01-06 DIAGNOSIS — I10 Essential (primary) hypertension: Secondary | ICD-10-CM | POA: Diagnosis not present

## 2016-01-06 DIAGNOSIS — K219 Gastro-esophageal reflux disease without esophagitis: Secondary | ICD-10-CM | POA: Insufficient documentation

## 2016-01-06 DIAGNOSIS — D649 Anemia, unspecified: Secondary | ICD-10-CM | POA: Diagnosis not present

## 2016-01-06 DIAGNOSIS — R1013 Epigastric pain: Secondary | ICD-10-CM | POA: Diagnosis not present

## 2016-01-06 DIAGNOSIS — Z90710 Acquired absence of both cervix and uterus: Secondary | ICD-10-CM | POA: Diagnosis not present

## 2016-01-06 DIAGNOSIS — Z79899 Other long term (current) drug therapy: Secondary | ICD-10-CM | POA: Insufficient documentation

## 2016-01-06 DIAGNOSIS — R079 Chest pain, unspecified: Secondary | ICD-10-CM | POA: Insufficient documentation

## 2016-01-06 DIAGNOSIS — F411 Generalized anxiety disorder: Secondary | ICD-10-CM | POA: Diagnosis not present

## 2016-01-06 DIAGNOSIS — R110 Nausea: Secondary | ICD-10-CM | POA: Diagnosis not present

## 2016-01-06 HISTORY — PX: 24 HOUR PH STUDY: SHX5419

## 2016-01-06 HISTORY — PX: ESOPHAGEAL MANOMETRY: SHX5429

## 2016-01-06 SURGERY — MONITORING, ESOPHAGEAL PH, 24 HOUR

## 2016-01-06 MED ORDER — BUTAMBEN-TETRACAINE-BENZOCAINE 2-2-14 % EX AERO
1.0000 | INHALATION_SPRAY | Freq: Once | CUTANEOUS | Status: AC
  Administered 2016-01-06: 1 via TOPICAL
  Filled 2016-01-06: qty 20

## 2016-01-06 MED ORDER — LIDOCAINE HCL 2 % EX GEL
1.0000 "application " | Freq: Once | CUTANEOUS | Status: AC
  Administered 2016-01-06: 2
  Filled 2016-01-06: qty 5

## 2016-01-06 SURGICAL SUPPLY — 2 items
FACESHIELD LNG OPTICON STERILE (SAFETY) IMPLANT
GLOVE BIO SURGEON STRL SZ8 (GLOVE) ×6 IMPLANT

## 2016-01-06 NOTE — H&P (Signed)
  Midge Miniumarren Danika Kluender, MD [facility] [address] [place], [place] [address] Phone: [phone] Fax : [phone]  Primary Care Physician:  Margaretann LovelessJennifer M Burnette, PA-C Primary Gastroenterologist:  Dr. Servando SnareWohl  Pre-Procedure History & Physical: HPI:  Tami CopierLisa P Williams is a 50 y.o. female is here for an endoscopy.   Past Medical History:  Diagnosis Date  . Acute epigastric pain   . GAD (generalized anxiety disorder)   . GERD (gastroesophageal reflux disease)   . Hypertension   . Non-cardiac chest pain     Past Surgical History:  Procedure Laterality Date  . ABDOMINAL HYSTERECTOMY     due to endometriosis  . ESOPHAGOGASTRODUODENOSCOPY    . ESOPHAGOGASTRODUODENOSCOPY (EGD) WITH PROPOFOL N/A 12/02/2015   Procedure: ESOPHAGOGASTRODUODENOSCOPY (EGD) WITH PROPOFOL;  Surgeon: Scot Junobert T Elliott, MD;  [facility] END[facility]COPY;  Service: Endoscopy;  Laterality: N/A;  . NISSEN FUNDOPLICATION  2007   dr. Katrinka Blazingsmith    Prior to Admission medications   Medication Sig Start Date End Date Taking? Authorizing Provider  fluticasone (FLONASE) 50 MCG/ACT nasal spray Place into both nostrils daily.    Historical Provider, MD  lisinopril (PRINIVIL,ZESTRIL) 10 MG tablet Take 1 tablet (10 mg total) by mouth daily. 12/17/15   Margaretann LovelessJennifer M Burnette, PA-C  pantoprazole (PROTONIX) 40 MG tablet Take 1 tablet (40 mg total) by mouth 2 (two) times daily. 12/17/15   Margaretann LovelessJennifer M Burnette, PA-C  PARoxetine (PAXIL) 20 MG tablet Take 1 tablet (20 mg total) by mouth daily. 01/01/16   Margaretann LovelessJennifer M Burnette, PA-C  SYNTHROID 25 MCG tablet TK 1 T PO QD 08/03/15   Historical Provider, MD    Allergies as of 12/23/2015  . (No Known Allergies)    Family History  Problem Relation Age of Onset  . Hypertension Mother   . Heart failure Mother     Defib & pacemaker implant   . Heart disease Mother   . Hypertension Father     Social History   Social History  . Marital status: Married    Spouse name: N/A  . Number of children: N/A  . Years  of education: N/A   Occupational History  . Not on file.   Social History Main Topics  . Smoking status: Never Smoker  . Smokeless tobacco: Never Used  . Alcohol use 0.0 oz/week     Comment: occasional  . Drug use: No  . Sexual activity: Not on file   Other Topics Concern  . Not on file   Social History Narrative  . No narrative on file    Review of Systems: See HPI, otherwise negative ROS  Physical Exam: There were no vitals taken for this visit. General:   Alert,  pleasant and cooperative in NAD Head:  Normocephalic and atraumatic. Neck:  Supple; no masses or thyromegaly. Lungs:  Clear throughout to auscultation.    Heart:  Regular rate and rhythm. Abdomen:  Soft, nontender and nondistended. Normal bowel sounds, without guarding, and without rebound.   Neurologic:  Alert and  oriented x4;  grossly normal neurologically.  Impression/Plan: Tami CopierLisa P [name] is here for an endoscopy to be performed for Anemia  Risks, benefits, limitations, and alternatives regarding  endoscopy have been reviewed with the patient.  Questions have been answered.  All parties agreeable.   Midge Miniumarren Deaysia Grigoryan, MD  01/06/2016, 7:57 AM

## 2016-01-07 ENCOUNTER — Encounter: Payer: Self-pay | Admitting: Gastroenterology

## 2016-01-11 ENCOUNTER — Telehealth: Payer: Self-pay | Admitting: General Surgery

## 2016-01-11 NOTE — Telephone Encounter (Signed)
Patient saw Dr Tonita CongWoodham in office on 12/23/15.  She would like to speak with the nurse as she has a question about the surgery that was discussed during her office visit - Surgery for her "slipped Nissen" Please call at your convenience.

## 2016-01-11 NOTE — Telephone Encounter (Signed)
Returned phone call to patient at this time. All questions answered regarding time needed off work for surgery and limitations after surgery.  All of results from Manometry and PH testing have not been released at this time. This information will be given to patient at follow-up appointment.

## 2016-01-19 ENCOUNTER — Encounter: Payer: Self-pay | Admitting: General Surgery

## 2016-01-19 ENCOUNTER — Telehealth: Payer: Self-pay | Admitting: Physician Assistant

## 2016-01-19 ENCOUNTER — Ambulatory Visit (INDEPENDENT_AMBULATORY_CARE_PROVIDER_SITE_OTHER): Payer: BC Managed Care – PPO | Admitting: General Surgery

## 2016-01-19 VITALS — BP 131/90 | HR 77 | Temp 98.1°F | Ht 64.0 in | Wt 130.8 lb

## 2016-01-19 DIAGNOSIS — F329 Major depressive disorder, single episode, unspecified: Secondary | ICD-10-CM

## 2016-01-19 DIAGNOSIS — K219 Gastro-esophageal reflux disease without esophagitis: Secondary | ICD-10-CM

## 2016-01-19 DIAGNOSIS — F32A Depression, unspecified: Secondary | ICD-10-CM

## 2016-01-19 NOTE — Telephone Encounter (Signed)
Pt states she has stopped taking the Rx Paroxetine (PAXIL) 20 MG tablet due to it was making her really sleepy.  Pt states today she feels really emotional and she has a lot of anxiety.  Pt states she is also feeling light headed.  Pt states she has a surgery coming up and she feels like this may be part of why she feels this way.  CB#[phone]/MW

## 2016-01-19 NOTE — Telephone Encounter (Signed)
Please advise. Emily Drozdowski, CMA  

## 2016-01-19 NOTE — Patient Instructions (Signed)
We will send a referral to [facility] as you have requested. We will call as soon as we obtain the appointment details.

## 2016-01-19 NOTE — Telephone Encounter (Signed)
She had been doing ok on the 10mg . Would she like to go back down to that dose to see how she does?  Does she want a change? Is she wanting to see how she does without until surgery?

## 2016-01-19 NOTE — Progress Notes (Signed)
Outpatient Surgical Follow Up  01/19/2016  Tami Williams is an 50 y.o. female.   Chief Complaint  Patient presents with  . Follow-up    Manometry (01/06/16)    HPI: 50 year old female returns to clinic for follow-up of her gastroesophageal reflux disease. She has completed her imaging and manometry that were ordered at her last visit. She states that she continues to reflux and that it appears be getting worse. She states she feels everything refluxing especially in the morning. She was unable to finish her coffee this morning secondary to reflux. She has been constantly whether not she wanted surgical intervention since her last visit and has decided she wants to. She is also self discontinued some of her antianxiety medications since her last visit. She currently denies any fevers, chills, chest pain, shortness breath, diarrhea,. She's having constant reflux disease with intermittent nausea but no vomiting.  Past Medical History:  Diagnosis Date  . Acute epigastric pain   . GAD (generalized anxiety disorder)   . GERD (gastroesophageal reflux disease)   . Hypertension   . Non-cardiac chest pain     Past Surgical History:  Procedure Laterality Date  . 24 HOUR PH STUDY N/A 01/06/2016   Procedure: 24 HOUR PH STUDY;  Surgeon: Midge Minium, MD;  Location: [facility] ENDOSCOPY;  Service: Endoscopy;  Laterality: N/A;  . ABDOMINAL HYSTERECTOMY     due to endometriosis  . ESOPHAGEAL MANOMETRY N/A 01/06/2016   Procedure: ESOPHAGEAL MANOMETRY (EM);  Surgeon: Midge Minium, MD;  Location: [facility] ENDOSCOPY;  Service: Endoscopy;  Laterality: N/A;  . ESOPHAGOGASTRODUODENOSCOPY    . ESOPHAGOGASTRODUODENOSCOPY (EGD) WITH PROPOFOL N/A 12/02/2015   Procedure: ESOPHAGOGASTRODUODENOSCOPY (EGD) WITH PROPOFOL;  Surgeon: Scot Jun, MD;  Location[facility] ENDOSCOPY;  Service: Endoscopy;  Laterality: N/A;  . NISSEN FUNDOPLICATION  2007   dr. Katrinka Blazing    Family History  Problem Relation Age of Onset  . Hypertension  Mother   . Heart failure Mother     Defib & pacemaker implant   . Heart disease Mother   . Hypertension Father     Social History:  reports that she has never smoked. She has never used smokeless tobacco. She reports that she drinks alcohol. She reports that she does not use drugs.  Allergies: No Known Allergies  Medications reviewed.    ROS A multipoint review of systems was completed, all pertinent positives and negatives are documented within the history of present illness remainder negative.   BP 131/90   Pulse 77   Temp 98.1 F (36.7 C) (Oral)   Ht 5\' 4"  (1.626 m)   Wt 59.3 kg (130 lb 12.8 oz)   BMI 22.45 kg/m   Physical Exam Gen.: No acute distress Neck: Supple and nontender Chest: Clear to auscultation without any use of accessory muscles and equal motion bilaterally Heart: Regular rhythm Abdomen: Soft and nontender without any evidence of rebound, guarding, distention. Extremities: Moves all extremities well    No results found for this or any previous visit (from the past 48 hour(s)). No results found.  Assessment/Plan:  1. Gastro-esophageal reflux disease without esophagitis 50 year old female with gastroesophageal reflux disease in the setting of a slipped Nissen. Her symptoms are worsening in spite of current medical management. Reviewed in detail her recent manometry findings which is consistent with symptomatic reflux. Again had a conversation with the patient about redo antireflux surgery and the inherent risks that go with this. Given her findings and her presentation counseled her that it would be  safer for her to have her redo surgery at a tertiary care center. She voiced understanding and [facility]o antireflux [facility]. We will place a consult today and she will follow-up with them.  The total 25 minutes was spent with this encounter with greater than 50% of this use for counseling and coordination of  care.   Ricarda Frameharles Izzie Geers, MD FACS General Surgeon  01/19/2016,4:09 PM

## 2016-01-20 MED ORDER — BUPROPION HCL ER (SR) 150 MG PO TB12: 150.0000 mg | ORAL_TABLET | Freq: Two times a day (BID) | ORAL | 0 refills | Status: DC

## 2016-01-20 NOTE — Telephone Encounter (Signed)
Pt reports even the 10 mg made her sleepy. Pt has tried Lexapro in the past, which pt liked, but it also caused drowsiness. Pt reports she even tried taking this medication at night, which did not improve sleepiness. Pt also tried Zoloft in the past, which pt did NOT like. Pt is very anxious, and would like to start something ASAP, but does not want the drowsy side effect. Please advise. Allene DillonEmily Drozdowski, CMA

## 2016-01-20 NOTE — Telephone Encounter (Signed)
I did read GI note

## 2016-01-20 NOTE — Telephone Encounter (Signed)
Pt advised. FYI, pt wanted to inform you [facility]. Allene DillonEmily Drozdowski, CMA

## 2016-01-20 NOTE — Telephone Encounter (Signed)
Have her start wellbutrin. [facility]. She may take one pill daily x 1 week then increase to BID. Call if she has adverse effects.

## 2016-01-25 ENCOUNTER — Telehealth: Payer: Self-pay | Admitting: Surgery

## 2016-01-25 ENCOUNTER — Encounter: Payer: Self-pay | Admitting: General Surgery

## 2016-01-25 NOTE — Telephone Encounter (Signed)
Patient has called again and stated that she has called [facility] at the number provided to her in the last phone call and they advised her that they do not have a referral. I advised the patient of the confirmation form that I received after the referral was faxed and informed her that I would fax it again.   The patient has asked that her nurse call her back and I have advised her that me and Ami (receptionist) has both put messages in for the nurse, however that the nurse is in clinic at this time and may not have received the message at this time. I informed her that the nurse should give her a call before the end of the business day.   The patient began to get very upset and raise her voice that she was very sick and I then advised her if she is in need of medical attention at this time to go to ED. She stated that she just did not feel good at this time because she can not keep any thing down.   I have informed her that I have called [facility] and they would not let me make an appointment for her until the clinic notes were reviewed and decided and that I am sorry for the pain she is going through at this time. That we are trying all we can do to get her an appointment.   I feel that she is under the impression that she was to be scheduled for a procedure by [facility] and that this was to be schedule this week per her conversation that she was stated that she was told at her last clinic appointment.

## 2016-01-25 NOTE — Telephone Encounter (Signed)
Patient is calling to make sure the nurse knows that she wants to speak with her. I advised her the message has been sent to the nurse, but they are also in clinic and the nurse will return her call as soon as she is able to.

## 2016-01-25 NOTE — Telephone Encounter (Signed)
Referral has been sent to [facility] on 01/21/16 for Redo Anti-Reflux procedure. All clinic notes have been faxed to [phone].  I have called this morning to check on the status of the appointment. Per the receptionist at [phone] the notes have to be reviewed by a nurse and then a physician will be decided. I was not able to make an appointment over the phone.   I have explain the referral process with the patient that [facility] her with an appointment date and time once her chart has been reviewed. This could take 5-7 business days.   Patient was very unhappy because she states that she was told by the nurse at her visit that she would have a [facility] and not an office visit and that she was not able to wait another 5-7 business days for a call from [facility].  She stated that she would like to speak with her nurse concerning this.   The phone number [phone]) to [facility] was given to the patient so that she could call and follow up on her appointment status as I also told her I would follow up on the status as well.

## 2016-01-25 NOTE — Telephone Encounter (Signed)
Spoke with patient at this time. Explained to patient that the referral has been placed and [facility] should be getting in touch with her within 5-7 days from the date the referral was placed. Asked patient that if she does not hear from [facility] by the end of the week that she would need to let me know so that I can follow-up on this matter.  Patient was upset at how referral process was handled. I ensured her that I would speak with staff regarding this and this would be handled appropriately. She was thankful for this.

## 2016-02-01 ENCOUNTER — Telehealth: Payer: Self-pay | Admitting: General Surgery

## 2016-02-01 NOTE — Telephone Encounter (Signed)
Patient would like to speak with the nurse about calling in some medication. [facility] is [facility] (although they cannot see her until Oct 19th - Angie is working [facility] a [facility]) Since it is taking so long to be seen, she needs something stronger called in for her reflux and chest. Please call and advise. Thank you.

## 2016-02-01 NOTE — Telephone Encounter (Signed)
I have called [facility] and spoke with Abby [phone]) and she stated that she could not make an appointment with GI until all referral and records are received. I have faxed all clinic notes and referral to [facility] to [phone]. They will call patient 5-7 business days after receiving information.  Patient has been advised of this as I have contacted her to advise her of all this information.   I have also made an appointment with [facility] the patient with Oliver PilaJane Duffey, PA on 02/08/16 @ 1:30pm. A new patient packet will be mailed to the patient per [facility] @ [phone]. I have faxed all clinic notes to [phone]. Patient has been advised of this appointment and all information provided.   Patient stated that [facility] could not see her till 03/03/16, therefor she would like to be referred elsewhere.  She stated that if [facility] that she will contact me so that I can cancel her appointment [facility].   Patient was pleased with the appointment and referral. She stated that she will let her husband know of the appointments and referrals due to his previous phone call today. He was very upset because the outside hospitals' were not able to see her sooner. I explained to him that we are trying to best we can to have her seen however we do not have any control of other hospital scheduling and he understands this.

## 2016-02-01 NOTE — Telephone Encounter (Signed)
Spoke with patient at this time. She is thrilled that she was able to get an appointment next week for [facility] but is still having difficulty with her GERD in the meantime. Spoke with Dr. Tonita CongWoodham who would like to call in a GI cocktail solution for patient to help. Patient is in agreement to try this medication.  [facility] on [address] at this time.

## 2016-02-01 NOTE — Telephone Encounter (Signed)
Patient called and requested to be referred to [facility] at this time. We sent a referral to [facility], patient states they called her this morning for her appointment and they could not see her until October 19th. She is now requesting a referral to be sent to [facility], so that maybe they can see her sooner. I advised her that Angie, our referral coordinator, will work on the referral to [facility] today. I advised her that Karoline Caldwellngie will try to have it sent in today, but it would be completed no later than the end of the business day on Wednesday 02/03/16. Patient verbalized understanding.

## 2016-02-08 ENCOUNTER — Telehealth: Payer: Self-pay | Admitting: General Surgery

## 2016-02-08 NOTE — Telephone Encounter (Signed)
Patient is requesting to speak with you about [place] [facility]. She was referred by Dr Tonita CongWoodham.

## 2016-02-10 ENCOUNTER — Encounter: Payer: Self-pay | Admitting: General Surgery

## 2016-02-12 ENCOUNTER — Telehealth: Payer: Self-pay | Admitting: General Surgery

## 2016-02-12 NOTE — Telephone Encounter (Signed)
Heather from [facility] has called to follow up on the request we have sent for patient's referral. She stated that the patient would need to see GI to have a Barium Swallow test done, a 24 PH Study and a Esophagealmanomotry done before she would be able to see a GI surgeon. I will pass this information to Dr Tonita CongWoodham to see what the next step should be.   I have advised Herbert SetaHeather that the patient was very unhappy that she was not able to get an appointment much sooner at [facility] and she stated that she was going by the referral process given to her by the nurse.   Herbert SetaHeather states that she will contact the patient to go over the referral process and discuss with her the process of the referral with [facility].

## 2016-02-12 NOTE — Telephone Encounter (Signed)
I have contacted [facility] at [facility] to advise her that all imaging that was requested was completed and is in [facility].   She stated that she has called the patient and patient states that she has an appointment with [facility] at this time.

## 2016-02-16 ENCOUNTER — Other Ambulatory Visit: Payer: Self-pay | Admitting: Physician Assistant

## 2016-02-16 DIAGNOSIS — F32A Depression, unspecified: Secondary | ICD-10-CM

## 2016-02-16 DIAGNOSIS — F329 Major depressive disorder, single episode, unspecified: Secondary | ICD-10-CM

## 2016-02-22 ENCOUNTER — Other Ambulatory Visit: Payer: Self-pay | Admitting: Physician Assistant

## 2016-02-22 DIAGNOSIS — I10 Essential (primary) hypertension: Secondary | ICD-10-CM

## 2016-07-06 ENCOUNTER — Encounter: Payer: Self-pay | Admitting: Physician Assistant

## 2016-07-06 ENCOUNTER — Ambulatory Visit (INDEPENDENT_AMBULATORY_CARE_PROVIDER_SITE_OTHER): Payer: BC Managed Care – PPO | Admitting: Physician Assistant

## 2016-07-06 VITALS — BP 102/78 | HR 72 | Temp 97.8°F | Resp 16 | Wt 130.0 lb

## 2016-07-06 DIAGNOSIS — F411 Generalized anxiety disorder: Secondary | ICD-10-CM

## 2016-07-06 DIAGNOSIS — M62830 Muscle spasm of back: Secondary | ICD-10-CM

## 2016-07-06 MED ORDER — PREDNISONE 10 MG PO TABS: ORAL_TABLET | ORAL | 0 refills | Status: DC

## 2016-07-06 MED ORDER — ESCITALOPRAM OXALATE 10 MG PO TABS: 10.0000 mg | ORAL_TABLET | Freq: Every day | ORAL | 0 refills | Status: DC

## 2016-07-06 MED ORDER — CYCLOBENZAPRINE HCL 10 MG PO TABS: 10.0000 mg | ORAL_TABLET | Freq: Three times a day (TID) | ORAL | 0 refills | Status: DC | PRN

## 2016-07-06 NOTE — Patient Instructions (Signed)
Lumbosacral Strain Lumbosacral strain is an injury that causes pain in the lower back (lumbosacral spine). This injury usually occurs from overstretching the muscles or ligaments along your spine. A strain can affect one or more muscles or cord-like tissues that connect bones to other bones (ligaments). What are the causes? This condition may be caused by:  A hard, direct hit (blow) to the back.  Excessive stretching of the lower back muscles. This may result from: ? A fall. ? Lifting something heavy. ? Repetitive movements such as bending or crouching.  What increases the risk? The following factors may increase your risk of getting this condition:  Participating in sports or activities that involve: ? A sudden twist of the back. ? Pushing or pulling motions.  Being overweight or obese.  Having poor strength and flexibility, especially tight hamstrings or weak muscles in the back or abdomen.  Having too much of a curve in the lower back.  Having a pelvis that is tilted forward.  What are the signs or symptoms? The main symptom of this condition is pain in the lower back, at the site of the strain. Pain may extend (radiate) down one or both legs. How is this diagnosed? This condition is diagnosed based on:  Your symptoms.  Your medical history.  A physical exam. ? Your health care provider may push on certain areas of your back to determine the source of your pain. ? You may be asked to bend forward, backward, and side to side to assess the severity of your pain and your range of motion.  Imaging tests, such as: ? X-rays. ? MRI.  How is this treated? Treatment for this condition may include:  Putting heat and cold on the affected area.  Medicines to help relieve pain and relax your muscles (muscle relaxants).  NSAIDs to help reduce swelling and discomfort.  When your symptoms improve, it is important to gradually return to your normal routine as soon as possible  to reduce pain, avoid stiffness, and avoid loss of muscle strength. Generally, symptoms should improve within 6 weeks of treatment. However, recovery time varies. Follow these instructions at home: Managing pain, stiffness, and swelling   If directed, put ice on the injured area during the first 24 hours after your strain. ? Put ice in a plastic bag. ? Place a towel between your skin and the bag. ? Leave the ice on for 20 minutes, 2-3 times a day.  If directed, put heat on the affected area as often as told by your health care provider. Use the heat source that your health care provider recommends, such as a moist heat pack or a heating pad. ? Place a towel between your skin and the heat source. ? Leave the heat on for 20-30 minutes. ? Remove the heat if your skin turns bright red. This is especially important if you are unable to feel pain, heat, or cold. You may have a greater risk of getting burned. Activity  Rest and return to your normal activities as told by your health care provider. Ask your health care provider what activities are safe for you.  Avoid activities that take a lot of energy for as long as told by your health care provider. General instructions  Take over-the-counter and prescription medicines only as told by your health care provider.  Donot drive or use heavy machinery while taking prescription pain medicine.  Do not use any products that contain nicotine or tobacco, such as cigarettes and e-cigarettes.   ask your health care provider.  Keep all follow-up visits as told by your health care provider. This is important. How is this prevented?  Use correct form when playing sports and lifting heavy objects.  Use good posture when sitting and standing.  Maintain a healthy weight.  Sleep on a mattress with medium firmness to support your back.  Be safe and responsible while being active to avoid falls.  Do at least 150 minutes of  moderate-intensity exercise each week, such as brisk walking or water aerobics. Try a form of exercise that takes stress off your back, such as swimming or stationary cycling.  Maintain physical fitness, including:  Strength.  Flexibility.  Cardiovascular fitness.  Endurance. Contact a health care provider if:  Your back pain does not improve after 6 weeks of treatment.  Your symptoms get worse. Get help right away if:  Your back pain is severe.  You cannot stand or walk.  You have difficulty controlling when you urinate or when you have a bowel movement.  You feel nauseous or you vomit.  Your feet get very cold.  You have numbness, tingling, weakness, or problems using your arms or legs.  You develop any of the following:  Shortness of breath.  Dizziness.  Pain in your legs.  Weakness in your buttocks or legs.  Discoloration of the skin on your toes or legs. This information is not intended to replace advice given to you by your health care provider. Make sure you discuss any questions you have with your health care provider. Document Released: 02/09/2005 Document Revised: 11/20/2015 Document Reviewed: 10/04/2015 Elsevier Interactive Patient Education  2017 [employer] Muscle Cramps and Spasms Muscle cramps and spasms occur when a muscle or muscles tighten and you have no control over this tightening (involuntary muscle contraction). They are a common problem and can develop in any muscle. The most common place is in the calf muscles of the leg. Both muscle cramps and muscle spasms are involuntary muscle contractions, but they also have differences:   Muscle cramps are sporadic and painful. They may last a few seconds to a quarter of an hour. Muscle cramps are often more forceful and last longer than muscle spasms.  Muscle spasms may or may not be painful. They may also last just a few seconds or much longer. CAUSES  It is uncommon for cramps or spasms to be due  to a serious underlying problem. In many cases, the cause of cramps or spasms is unknown. Some common causes are:   Overexertion.   Overuse from repetitive motions (doing the same thing over and over).   Remaining in a certain position for a long period of time.   Improper preparation, form, or technique while performing a sport or activity.   Dehydration.   Injury.   Side effects of some medicines.   Abnormally low levels of the salts and ions in your blood (electrolytes), especially potassium and calcium. This could happen if you are taking water pills (diuretics) or you are pregnant.  Some underlying medical problems can make it more likely to develop cramps or spasms. These include, but are not limited to:   Diabetes.   Parkinson disease.   Hormone disorders, such as thyroid problems.   Alcohol abuse.   Diseases specific to muscles, joints, and bones.   Blood vessel disease where not enough blood is getting to the muscles.  HOME CARE INSTRUCTIONS   Stay well hydrated. Drink enough water and fluids to keep your  urine clear or pale yellow.  It may be helpful to massage, stretch, and relax the affected muscle.  For tight or tense muscles, use a warm towel, heating pad, or hot shower water directed to the affected area.  If you are sore or have pain after a cramp or spasm, applying ice to the affected area may relieve discomfort.  Put ice in a plastic bag.  Place a towel between your skin and the bag.  Leave the ice on for 15-20 minutes, 3-4 times a day.  Medicines used to treat a known cause of cramps or spasms may help reduce their frequency or severity. Only take over-the-counter or prescription medicines as directed by your caregiver. SEEK MEDICAL CARE IF:  Your cramps or spasms get more severe, more frequent, or do not improve over time.  MAKE SURE YOU:   Understand these instructions.  Will watch your condition.  Will get help right away if  you are not doing well or get worse. This information is not intended to replace advice given to you by your health care provider. Make sure you discuss any questions you have with your health care provider. Document Released: 10/22/2001 Document Revised: 08/27/2012 Document Reviewed: 02/03/2015 Elsevier Interactive Patient Education  2017 [employer].

## 2016-07-06 NOTE — Progress Notes (Signed)
Patient: Tami Williams Female    DOB: 1965-08-05   50 y.o.   MRN: [id number] Visit Date: 07/06/2016  Today's Provider: Margaretann LovelessJennifer M Laguana Desautel, PA-C   Chief Complaint  Patient presents with  . Back Pain  . Hip Pain   Subjective:    Back Pain  This is a new problem. The current episode started 1 to 4 weeks ago (For back). The problem occurs daily. The problem has been gradually worsening since onset. The pain is present in the lumbar spine. The quality of the pain is described as aching, stabbing and shooting (Stabbing and shooting pain when she bends to pick something up). The pain is at a severity of 5/10 (when she has the pain is 8-9/10). The pain is moderate. The symptoms are aggravated by standing, bending and twisting. Stiffness is present: only when she is still for a long time. Pertinent negatives include no abdominal pain, bladder incontinence, bowel incontinence, leg pain, numbness, pelvic pain, perianal numbness, tingling or weakness. She has tried muscle relaxant (Cyclobenzaprine from her husband) for the symptoms. The treatment provided mild relief.  Hip Pain   The incident occurred more than 1 week ago. There was no injury mechanism. The pain is present in the left hip. Quality: feels sore. The pain is at a severity of 3/10. The pain is mild. Pertinent negatives include no inability to bear weight, numbness or tingling. She reports no foreign bodies present. The symptoms are aggravated by movement (Sitting for a long time and with a lot of movement).   She reports it has been getting slowly better but still present. She does report that about 3 weeks ago she moved and did a lot of carrying boxes then unpacking. There was a lot of lifting and rotating. She did not notice pain then but states the next day she awoke with the pain.  She is also interested in restarting her lexapro. She had been on before and her only side effect was fatigue so she had discontinued. She has been  having recurring symptoms due to her husband's health and would like to restart. She reports that at the same time she had discontinued she also found out she was hypothyroid and feels that may have been the cause of her fatigue.     No Known Allergies   Current Outpatient Prescriptions:  .  lisinopril (PRINIVIL,ZESTRIL) 10 MG tablet, TAKE 1 TABLET(10 MG) BY MOUTH DAILY, Disp: 90 tablet, Rfl: 1 .  SYNTHROID 25 MCG tablet, TK 1 T PO QD, Disp: , Rfl: 6 .  buPROPion (WELLBUTRIN SR) 150 MG 12 hr tablet, TAKE 1 TABLET(150 MG) BY MOUTH TWICE DAILY (Patient not taking: Reported on 07/06/2016), Disp: 60 tablet, Rfl: 5 .  fluticasone (FLONASE) 50 MCG/ACT nasal spray, Place into both nostrils daily., Disp: , Rfl:  .  pantoprazole (PROTONIX) 40 MG tablet, Take 1 tablet (40 mg total) by mouth 2 (two) times daily. (Patient not taking: Reported on 07/06/2016), Disp: 60 tablet, Rfl: 1  Review of Systems  Constitutional: Negative.   Respiratory: Negative.   Cardiovascular: Negative.   Gastrointestinal: Negative for abdominal pain and bowel incontinence.  Genitourinary: Negative for bladder incontinence and pelvic pain.  Musculoskeletal: Positive for back pain.  Neurological: Negative for tingling, weakness and numbness.  Psychiatric/Behavioral: Positive for dysphoric mood and sleep disturbance. The patient is not nervous/anxious.     Social History  Substance Use Topics  . Smoking status: Never Smoker  . Smokeless tobacco:  Never Used  . Alcohol use 0.0 oz/week     Comment: occasional   Objective:   BP 102/78 (BP Location: Left Arm, Patient Position: Sitting, Cuff Size: Normal)   Pulse 72   Temp 97.8 F (36.6 C) (Oral)   Resp 16   Wt 130 lb (59 kg)   BMI 22.31 kg/m   Physical Exam  Constitutional: She appears well-developed and well-nourished. No distress.  Neck: Normal range of motion. Neck supple. No tracheal deviation present. No thyromegaly present.  Cardiovascular: Normal rate, regular  rhythm and normal heart sounds.  Exam reveals no gallop and no friction rub.   No murmur heard. Pulmonary/Chest: Effort normal and breath sounds normal. No respiratory distress. She has no wheezes. She has no rales.  Musculoskeletal:       Lumbar back: She exhibits tenderness (left quadratus lumborum) and spasm. She exhibits normal range of motion (just has to move slowly through), no bony tenderness, no swelling, no deformity and no pain.  Lymphadenopathy:    She has no cervical adenopathy.  Skin: She is not diaphoretic.  Vitals reviewed.     Assessment & Plan:     1. Muscle spasm of back Will give flexeril and prednisone as below. May use moist heat. Advised of stretches and mild exercises. I will see her back in 2 weeks. If no improvement will get imaging. - predniSONE (DELTASONE) 10 MG tablet; Take 6 tabs PO on day 1&2, 5 tabs PO on day 3&4, 4 tabs PO on day 5&6, 3 tabs PO on day 7&8, 2 tabs PO on day 9&10, 1 tab PO on day 11&12.  Dispense: 42 tablet; Refill: 0 - cyclobenzaprine (FLEXERIL) 10 MG tablet; Take 1 tablet (10 mg total) by mouth 3 (three) times daily as needed for muscle spasms.  Dispense: 30 tablet; Refill: 0  2. GAD (generalized anxiety disorder) Will restart lexapro as below. She is to call if she develops side effect of fatigue again.  - escitalopram (LEXAPRO) 10 MG tablet; Take 1 tablet (10 mg total) by mouth at bedtime. Start with 0.5 tab x 1 week  Dispense: 30 tablet; Refill: 0       Margaretann Loveless, PA-C  [facility]

## 2016-07-21 ENCOUNTER — Ambulatory Visit (INDEPENDENT_AMBULATORY_CARE_PROVIDER_SITE_OTHER): Payer: BC Managed Care – PPO | Admitting: Physician Assistant

## 2016-07-21 ENCOUNTER — Encounter: Payer: Self-pay | Admitting: Physician Assistant

## 2016-07-21 VITALS — BP 106/76 | HR 76 | Temp 97.5°F | Resp 16 | Wt 127.0 lb

## 2016-07-21 DIAGNOSIS — I10 Essential (primary) hypertension: Secondary | ICD-10-CM

## 2016-07-21 DIAGNOSIS — F411 Generalized anxiety disorder: Secondary | ICD-10-CM

## 2016-07-21 DIAGNOSIS — M62838 Other muscle spasm: Secondary | ICD-10-CM

## 2016-07-21 MED ORDER — ESCITALOPRAM OXALATE 10 MG PO TABS: 10.0000 mg | ORAL_TABLET | Freq: Every day | ORAL | 3 refills | Status: DC

## 2016-07-21 NOTE — Progress Notes (Signed)
Patient: Tami Williams P Molony Female    DOB: [birth year]   51 y.o.   MRN: [id number] Visit Date: 07/21/2016  Today's Provider: Margaretann LovelessJennifer M Melodi Happel, PA-C   Chief Complaint  Patient presents with  . Follow-up   Subjective:    Patient was started restarted on Lexapro 10 mg, reports good tolerance and compliance. Patient reports lower back pain has improved on the prednisone and has only had to use flexeril once this week.  Patient is wanting to discontinue Lisinopril. Patient reports that after restarting Lexapro her blood pressure has been stable.    Hypertension, follow-up:  BP Readings from Last 3 Encounters:  07/21/16 106/76  07/06/16 102/78  01/19/16 131/90    She was last seen for hypertension 6 months ago.  BP at that visit was 131/90 . Management changes since that visit include no changes. She reports excellent compliance with treatment. She is not having side effects.    Outside blood pressures are stable. She is experiencing none.  Patient denies chest pain and lower extremity edema.   Cardiovascular risk factors include none.  Use of agents associated with hypertension: none.     Weight trend: stable Wt Readings from Last 3 Encounters:  07/21/16 127 lb (57.6 kg)  07/06/16 130 lb (59 kg)  01/19/16 130 lb 12.8 oz (59.3 kg)    Current diet: in general, a "healthy" diet    ------------------------------------------------------------------------      No Known Allergies   Current Outpatient Prescriptions:  .  cyclobenzaprine (FLEXERIL) 10 MG tablet, Take 1 tablet (10 mg total) by mouth 3 (three) times daily as needed for muscle spasms., Disp: 30 tablet, Rfl: 0 .  escitalopram (LEXAPRO) 10 MG tablet, Take 1 tablet (10 mg total) by mouth at bedtime. Start with 0.5 tab x 1 week, Disp: 30 tablet, Rfl: 0 .  fluticasone (FLONASE) 50 MCG/ACT nasal spray, Place 2 sprays into both nostrils as needed. , Disp: , Rfl:  .  lisinopril (PRINIVIL,ZESTRIL) 10 MG tablet,  TAKE 1 TABLET(10 MG) BY MOUTH DAILY, Disp: 90 tablet, Rfl: 1 .  SYNTHROID 25 MCG tablet, TK 1 T PO QD, Disp: , Rfl: 6 .  pantoprazole (PROTONIX) 40 MG tablet, Take 1 tablet (40 mg total) by mouth 2 (two) times daily. (Patient not taking: Reported on 07/06/2016), Disp: 60 tablet, Rfl: 1  Review of Systems  Constitutional: Negative.   Respiratory: Negative.   Cardiovascular: Negative.   Gastrointestinal: Negative.   Musculoskeletal: Positive for myalgias (improved).  Neurological: Negative.   Psychiatric/Behavioral: Negative for decreased concentration, dysphoric mood, self-injury, sleep disturbance and suicidal ideas. The patient is nervous/anxious.        Improving    Social History  Substance Use Topics  . Smoking status: Never Smoker  . Smokeless tobacco: Never Used  . Alcohol use 0.0 oz/week     Comment: occasional   Objective:   BP 106/76 (BP Location: Left Arm, Patient Position: Sitting, Cuff Size: Normal)   Pulse 76   Temp 97.5 F (36.4 C) (Oral)   Resp 16   Wt 127 lb (57.6 kg)   BMI 21.80 kg/m  Vitals:   07/21/16 1618  BP: 106/76  Pulse: 76  Resp: 16  Temp: 97.5 F (36.4 C)  TempSrc: Oral  Weight: 127 lb (57.6 kg)    GAD 7 : Generalized Anxiety Score 07/21/2016  Nervous, Anxious, on Edge 2  Control/stop worrying 1  Worry too much - different things 2  Trouble relaxing 2  Restless 0  Easily annoyed or irritable 3  Afraid - awful might happen 0  Total GAD 7 Score 10  Anxiety Difficulty Not difficult at all     Physical Exam  Constitutional: She appears well-developed and well-nourished. No distress.  Neck: Normal range of motion. Neck supple. No JVD present. No tracheal deviation present. No thyromegaly present.  Cardiovascular: Normal rate, regular rhythm and normal heart sounds.  Exam reveals no gallop and no friction rub.   No murmur heard. Pulmonary/Chest: Effort normal and breath sounds normal. No respiratory distress. She has no wheezes. She has no  rales.  Musculoskeletal: She exhibits no edema.       Lumbar back: Normal.  Lymphadenopathy:    She has no cervical adenopathy.  Skin: She is not diaphoretic.  Psychiatric: She has a normal mood and affect. Her behavior is normal. Judgment and thought content normal.  Vitals reviewed.     Assessment & Plan:     1. GAD (generalized anxiety disorder) Continue lexapro as below. I will see her back in 3 months to recheck and see how she is doing and for her CPE. - escitalopram (LEXAPRO) 10 MG tablet; Take 1 tablet (10 mg total) by mouth at bedtime.  Dispense: 30 tablet; Refill: 3  2. Essential hypertension BP readings have been low recently. Will d/c lisniopril. Will recheck BP in 3 months.  3. Muscle spasm Improved.       Margaretann Loveless, PA-C  [facility]

## 2016-07-21 NOTE — Patient Instructions (Signed)
DASH Eating Plan DASH stands for "Dietary Approaches to Stop Hypertension." The DASH eating plan is a healthy eating plan that has been shown to reduce high blood pressure (hypertension). It may also reduce your risk for type 2 diabetes, heart disease, and stroke. The DASH eating plan may also help with weight loss. What are tips for following this plan? General guidelines  Avoid eating more than 2,300 mg (milligrams) of salt (sodium) a day. If you have hypertension, you may need to reduce your sodium intake to 1,500 mg a day.  Limit alcohol intake to no more than 1 drink a day for nonpregnant women and 2 drinks a day for men. One drink equals 12 oz of beer, 5 oz of wine, or 1 oz of hard liquor.  Work with your health care provider to maintain a healthy body weight or to lose weight. Ask what an ideal weight is for you.  Get at least 30 minutes of exercise that causes your heart to beat faster (aerobic exercise) most days of the week. Activities may include walking, swimming, or biking.  Work with your health care provider or diet and nutrition specialist (dietitian) to adjust your eating plan to your individual calorie needs. Reading food labels  Check food labels for the amount of sodium per serving. Choose foods with less than 5 percent of the Daily Value of sodium. Generally, foods with less than 300 mg of sodium per serving fit into this eating plan.  To find whole grains, look for the word "whole" as the first word in the ingredient list. Shopping  Buy products labeled as "low-sodium" or "no salt added."  Buy fresh foods. Avoid canned foods and premade or frozen meals. Cooking  Avoid adding salt when cooking. Use salt-free seasonings or herbs instead of table salt or sea salt. Check with your health care provider or pharmacist before using salt substitutes.  Do not fry foods. Cook foods using healthy methods such as baking, boiling, grilling, and broiling instead.  Cook with  heart-healthy oils, such as olive, canola, soybean, or sunflower oil. Meal planning   Eat a balanced diet that includes: ? 5 or more servings of fruits and vegetables each day. At each meal, try to fill half of your plate with fruits and vegetables. ? Up to 6-8 servings of whole grains each day. ? Less than 6 oz of lean meat, poultry, or fish each day. A 3-oz serving of meat is about the same size as a deck of cards. One egg equals 1 oz. ? 2 servings of low-fat dairy each day. ? A serving of nuts, seeds, or beans 5 times each week. ? Heart-healthy fats. Healthy fats called Omega-3 fatty acids are found in foods such as flaxseeds and coldwater fish, like sardines, salmon, and mackerel.  Limit how much you eat of the following: ? Canned or prepackaged foods. ? Food that is high in trans fat, such as fried foods. ? Food that is high in saturated fat, such as fatty meat. ? Sweets, desserts, sugary drinks, and other foods with added sugar. ? Full-fat dairy products.  Do not salt foods before eating.  Try to eat at least 2 vegetarian meals each week.  Eat more home-cooked food and less restaurant, buffet, and fast food.  When eating at a restaurant, ask that your food be prepared with less salt or no salt, if possible. What foods are recommended? The items listed may not be a complete list. Talk with your dietitian about what   dietary choices are best for you. Grains Whole-grain or whole-wheat bread. Whole-grain or whole-wheat pasta. Brown rice. Oatmeal. Quinoa. Bulgur. Whole-grain and low-sodium cereals. Pita bread. Low-fat, low-sodium crackers. Whole-wheat flour tortillas. Vegetables Fresh or frozen vegetables (raw, steamed, roasted, or grilled). Low-sodium or reduced-sodium tomato and vegetable juice. Low-sodium or reduced-sodium tomato sauce and tomato paste. Low-sodium or reduced-sodium canned vegetables. Fruits All fresh, dried, or frozen fruit. Canned fruit in natural juice (without  added sugar). Meat and other protein foods Skinless chicken or turkey. Ground chicken or turkey. Pork with fat trimmed off. Fish and seafood. Egg whites. Dried beans, peas, or lentils. Unsalted nuts, nut butters, and seeds. Unsalted canned beans. Lean cuts of beef with fat trimmed off. Low-sodium, lean deli meat. Dairy Low-fat (1%) or fat-free (skim) milk. Fat-free, low-fat, or reduced-fat cheeses. Nonfat, low-sodium ricotta or cottage cheese. Low-fat or nonfat yogurt. Low-fat, low-sodium cheese. Fats and oils Soft margarine without trans fats. Vegetable oil. Low-fat, reduced-fat, or light mayonnaise and salad dressings (reduced-sodium). Canola, safflower, olive, soybean, and sunflower oils. Avocado. Seasoning and other foods Herbs. Spices. Seasoning mixes without salt. Unsalted popcorn and pretzels. Fat-free sweets. What foods are not recommended? The items listed may not be a complete list. Talk with your dietitian about what dietary choices are best for you. Grains Baked goods made with fat, such as croissants, muffins, or some breads. Dry pasta or rice meal packs. Vegetables Creamed or fried vegetables. Vegetables in a cheese sauce. Regular canned vegetables (not low-sodium or reduced-sodium). Regular canned tomato sauce and paste (not low-sodium or reduced-sodium). Regular tomato and vegetable juice (not low-sodium or reduced-sodium). Pickles. Olives. Fruits Canned fruit in a light or heavy syrup. Fried fruit. Fruit in cream or butter sauce. Meat and other protein foods Fatty cuts of meat. Ribs. Fried meat. Bacon. Sausage. Bologna and other processed lunch meats. Salami. Fatback. Hotdogs. Bratwurst. Salted nuts and seeds. Canned beans with added salt. Canned or smoked fish. Whole eggs or egg yolks. Chicken or turkey with skin. Dairy Whole or 2% milk, cream, and half-and-half. Whole or full-fat cream cheese. Whole-fat or sweetened yogurt. Full-fat cheese. Nondairy creamers. Whipped toppings.  Processed cheese and cheese spreads. Fats and oils Butter. Stick margarine. Lard. Shortening. Ghee. Bacon fat. Tropical oils, such as coconut, palm kernel, or palm oil. Seasoning and other foods Salted popcorn and pretzels. Onion salt, garlic salt, seasoned salt, table salt, and sea salt. Worcestershire sauce. Tartar sauce. Barbecue sauce. Teriyaki sauce. Soy sauce, including reduced-sodium. Steak sauce. Canned and packaged gravies. Fish sauce. Oyster sauce. Cocktail sauce. Horseradish that you find on the shelf. Ketchup. Mustard. Meat flavorings and tenderizers. Bouillon cubes. Hot sauce and Tabasco sauce. Premade or packaged marinades. Premade or packaged taco seasonings. Relishes. Regular salad dressings. Where to find more information:  National Heart, Lung, and Blood Institute: www.nhlbi.nih.gov  American Heart Association: www.heart.org Summary  The DASH eating plan is a healthy eating plan that has been shown to reduce high blood pressure (hypertension). It may also reduce your risk for type 2 diabetes, heart disease, and stroke.  With the DASH eating plan, you should limit salt (sodium) intake to 2,300 mg a day. If you have hypertension, you may need to reduce your sodium intake to 1,500 mg a day.  When on the DASH eating plan, aim to eat more fresh fruits and vegetables, whole grains, lean proteins, low-fat dairy, and heart-healthy fats.  Work with your health care provider or diet and nutrition specialist (dietitian) to adjust your eating plan to your individual   calorie needs. This information is not intended to replace advice given to you by your health care provider. Make sure you discuss any questions you have with your health care provider. Document Released: 04/21/2011 Document Revised: 04/25/2016 Document Reviewed: 04/25/2016 Elsevier Interactive Patient Education  2017 [employer]  

## 2016-07-28 ENCOUNTER — Ambulatory Visit (INDEPENDENT_AMBULATORY_CARE_PROVIDER_SITE_OTHER): Payer: BC Managed Care – PPO | Admitting: Physician Assistant

## 2016-07-28 ENCOUNTER — Encounter: Payer: Self-pay | Admitting: Physician Assistant

## 2016-07-28 VITALS — BP 124/70 | HR 80 | Temp 98.0°F | Resp 16 | Wt 134.0 lb

## 2016-07-28 DIAGNOSIS — B9789 Other viral agents as the cause of diseases classified elsewhere: Secondary | ICD-10-CM

## 2016-07-28 DIAGNOSIS — J069 Acute upper respiratory infection, unspecified: Secondary | ICD-10-CM | POA: Diagnosis not present

## 2016-07-28 NOTE — Patient Instructions (Signed)

## 2016-07-28 NOTE — Progress Notes (Signed)
[facility]  Chief Complaint  Patient Tami Roughpresents with  . URI    Started yesterday    Subjective:    Patient ID: Tami CopierLisa P Waites, female    DOB: November 30, 1965, 51 y.o.   MRN: [id number]  Upper Respiratory Infection: Tami Williams is a 51 y.o. female with a past medical history significant for allergic rhinitis complaining of symptoms of a URI, possible sinusitis. Symptoms include congestion and cough. Onset of symptoms was 1 days ago, gradually worsening since that time. She also c/o congestion, nasal congestion and post nasal drip for the past 1 day .  She is drinking plenty of fluids. Evaluation to date: none. Treatment to date: antihistamines, cough suppressants and decongestants. The treatment has provided no relief. No fever, nausea, vomiting, myalgias.  Review of Systems  Constitutional: Positive for chills. Negative for activity change, appetite change, diaphoresis, fatigue, fever and unexpected weight change.  HENT: Positive for congestion, postnasal drip, rhinorrhea, sinus pain and sinus pressure. Negative for ear discharge, ear pain, hearing loss, nosebleeds, sneezing, sore throat, tinnitus and trouble swallowing.   Eyes: Negative.   Respiratory: Positive for cough. Negative for apnea, choking, chest tightness, shortness of breath, wheezing and stridor.   Gastrointestinal: Negative.   Neurological: Positive for headaches. Negative for dizziness and light-headedness.       Objective:   BP 124/70 (BP Location: Left Arm, Patient Position: Sitting, Cuff Size: Normal)   Pulse 80   Temp 98 F (36.7 C) (Oral)   Resp 16   Wt 134 lb (60.8 kg)   BMI 23.00 kg/m   Patient Active Problem List   Diagnosis Date Noted  . Allergic rhinitis 03/20/2015  . Cold sore 03/20/2015  . Abnormal liver enzymes 03/20/2015  . 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 Hypertension 03/20/2015  . Diaphragmatic hernia 03/20/2015   . 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 Anxiety disorder 03/20/2015  . Acid reflux 09/23/2007  . Gastro-esophageal reflux disease without esophagitis 09/23/2007    Outpatient Encounter Prescriptions as of 07/28/2016  Medication Sig Note  . cyclobenzaprine (FLEXERIL) 10 MG tablet Take 1 tablet (10 mg total) by mouth 3 (three) times daily as needed for muscle spasms.   Marland Kitchen. escitalopram (LEXAPRO) 10 MG tablet Take 1 tablet (10 mg total) by mouth at bedtime.   . fluticasone (FLONASE) 50 MCG/ACT nasal spray Place 2 sprays into both nostrils as needed.    Marland Kitchen. lisinopril (PRINIVIL,ZESTRIL) 10 MG tablet Take 10 mg by mouth daily.   . pantoprazole (PROTONIX) 40 MG tablet Take 1 tablet (40 mg total) by mouth 2 (two) times daily.   Marland Kitchen. SYNTHROID 25 MCG tablet TK 1 T PO QD 08/13/2015: Received from: External Pharmacy   No facility-administered encounter medications on file as of 07/28/2016.     No Known Allergies     Physical Exam  Constitutional: She appears well-developed and well-nourished.  HENT:  Right Ear: External ear normal.  Left Ear: External ear normal.  Mouth/Throat: Oropharynx is clear and moist. No oropharyngeal exudate.  Eyes: Right eye exhibits discharge. Left eye exhibits discharge.  Neck: Neck supple.  Cardiovascular: Normal rate and regular rhythm.   Pulmonary/Chest: Effort normal and breath sounds normal. No respiratory distress. She has no rales.  Lymphadenopathy:    She has no cervical adenopathy.  Skin: Skin is warm and dry.  Psychiatric: She has a normal mood and affect. Her behavior is normal.       Assessment & Plan:  1. Viral URI  Counseled pt on nature of URI infections. Right now, do consider this viral. If sx present > 10 days, she may call back and will give Augmentin 875/125 BID x 10 days. Concerned about husband who is having surgery tomorrow, she can wear mask and wash hands. Counseled on her using her flonase again and starting daily 2nd gen  antihistamine of her choice for better control of her allergies. No sudafed.  Return if symptoms worsen or fail to improve.   Patient Instructions  Sinusitis, Adult Sinusitis is soreness and inflammation of your sinuses. Sinuses are hollow spaces in the bones around your face. They are located:  Around your eyes.  In the middle of your forehead.  Behind your nose.  In your cheekbones. Your sinuses and nasal passages are lined with a stringy fluid (mucus). Mucus normally drains out of your sinuses. When your nasal tissues get inflamed or swollen, the mucus can get trapped or blocked so air cannot flow through your sinuses. This lets bacteria, viruses, and funguses grow, and that leads to infection. Follow these instructions at home: Medicines   Take, use, or apply over-the-counter and prescription medicines only as told by your doctor. These may include nasal sprays.  If you were prescribed an antibiotic medicine, take it as told by your doctor. Do not stop taking the antibiotic even if you start to feel better. Hydrate and Humidify   Drink enough water to keep your pee (urine) clear or pale yellow.  Use a cool mist humidifier to keep the humidity level in your home above 50%.  Breathe in steam for 10-15 minutes, 3-4 times a day or as told by your doctor. You can do this in the bathroom while a hot shower is running.  Try not to spend time in cool or dry air. Rest   Rest as much as possible.  Sleep with your head raised (elevated).  Make sure to get enough sleep each night. General instructions   Put a warm, moist washcloth on your face 3-4 times a day or as told by your doctor. This will help with discomfort.  Wash your hands often with soap and water. If there is no soap and water, use hand sanitizer.  Do not smoke. Avoid being around people who are smoking (secondhand smoke).  Keep all follow-up visits as told by your doctor. This is important. Contact a doctor  if:  You have a fever.  Your symptoms get worse.  Your symptoms do not get better within 10 days. Get help right away if:  You have a very bad headache.  You cannot stop throwing up (vomiting).  You have pain or swelling around your face or eyes.  You have trouble seeing.  You feel confused.  Your neck is stiff.  You have trouble breathing. This information is not intended to replace advice given to you by your health care provider. Make sure you discuss any questions you have with your health care provider. Document Released: 10/19/2007 Document Revised: 12/27/2015 Document Reviewed: 02/25/2015 Elsevier Interactive Patient Education  2017 [employer].     The entirety of the information documented in the History of Present Illness, Review of Systems and Physical Exam were personally obtained by me. Portions of this information were initially documented by Kavin Leech, CMA and reviewed by me for thoroughness and accuracy.

## 2016-08-20 ENCOUNTER — Other Ambulatory Visit: Payer: Self-pay | Admitting: Physician Assistant

## 2016-08-20 DIAGNOSIS — I10 Essential (primary) hypertension: Secondary | ICD-10-CM

## 2016-09-12 ENCOUNTER — Encounter: Payer: Self-pay | Admitting: Physician Assistant

## 2016-09-12 ENCOUNTER — Ambulatory Visit (INDEPENDENT_AMBULATORY_CARE_PROVIDER_SITE_OTHER): Payer: BC Managed Care – PPO | Admitting: Physician Assistant

## 2016-09-12 VITALS — BP 102/64 | HR 68 | Temp 98.5°F | Resp 16 | Ht 64.0 in | Wt 135.0 lb

## 2016-09-12 DIAGNOSIS — Z1231 Encounter for screening mammogram for malignant neoplasm of breast: Secondary | ICD-10-CM

## 2016-09-12 DIAGNOSIS — Z136 Encounter for screening for cardiovascular disorders: Secondary | ICD-10-CM | POA: Diagnosis not present

## 2016-09-12 DIAGNOSIS — Z13220 Encounter for screening for lipoid disorders: Secondary | ICD-10-CM | POA: Diagnosis not present

## 2016-09-12 DIAGNOSIS — K449 Diaphragmatic hernia without obstruction or gangrene: Secondary | ICD-10-CM | POA: Diagnosis not present

## 2016-09-12 DIAGNOSIS — N951 Menopausal and female climacteric states: Secondary | ICD-10-CM | POA: Diagnosis not present

## 2016-09-12 DIAGNOSIS — F4321 Adjustment disorder with depressed mood: Secondary | ICD-10-CM

## 2016-09-12 DIAGNOSIS — Z0000 Encounter for general adult medical examination without abnormal findings: Secondary | ICD-10-CM

## 2016-09-12 DIAGNOSIS — Z1239 Encounter for other screening for malignant neoplasm of breast: Secondary | ICD-10-CM

## 2016-09-12 MED ORDER — PANTOPRAZOLE SODIUM 40 MG PO TBEC: 40.0000 mg | DELAYED_RELEASE_TABLET | Freq: Two times a day (BID) | ORAL | 1 refills | Status: DC | PRN

## 2016-09-12 NOTE — Progress Notes (Signed)
Patient: Tami Williams, Female    DOB: 26-Jun-1965, 51 y.o.   MRN: [id number] Visit Date: 09/12/2016  Today's Provider: Margaretann Loveless, PA-C   Chief Complaint  Patient presents with  . Annual Exam   Subjective:    Annual physical exam Tami Williams is a 51 y.o. female who presents today for health maintenance and complete physical. She feels fairly well. She reports exercising 1-2 times a week for 15-30 minutes. Pt walks. She reports she is sleeping well.  Last CPE- 09/09/2015 Last pap- 09/11/2012- Normal. HPV negative. No history of abnormal paps. Last mammogram- pt is due  ----------------------------------------------------------------- Pt believes she is menopausal. She is c/o sweats, especially night sweats. Pt had a partial hysterectomy in her 30's. She still has one ovary and one fallopian tube. Pt is also c/o left side pain. This pain is exacerbated by going from sitting to standing after sitting for a long time. This has been present for more than 2 months. Pt denies urinary sx.  Pt has D/C her escitalopram. She has recently changed her Synthroid dose to 50 mcg (F/B endocrinology), and she does not want to take medications while her TSH levels are evening out. Pt is also concerned that her libido will be affected with menopause, and does not want to decrease her libido further with SSRI use.  Review of Systems  Constitutional: Positive for diaphoresis. Negative for activity change, appetite change, chills, fatigue, fever and unexpected weight change.  HENT: Negative.   Eyes: Negative.   Respiratory: Negative.   Cardiovascular: Negative.   Gastrointestinal: Negative.   Endocrine: Negative.   Genitourinary: Negative.   Musculoskeletal: Positive for myalgias. Negative for arthralgias, back pain, gait problem, joint swelling, neck pain and neck stiffness.  Skin: Negative.   Allergic/Immunologic: Negative.   Neurological: Negative.   Hematological: Negative.     Psychiatric/Behavioral: Negative.     Social History      She  reports that she has never smoked. She has never used smokeless tobacco. She reports that she drinks alcohol. She reports that she does not use drugs.       Social History   Social History  . Marital status: Married    Spouse name: Enid Maultsby  . Number of children: 3  . Years of education: GED   Occupational History  .  [employer]   Social History Main Topics  . Smoking status: Never Smoker  . Smokeless tobacco: Never Used  . Alcohol use 0.0 oz/week     Comment: occasional  . Drug use: No  . Sexual activity: Yes   Other Topics Concern  . None   Social History Narrative  . None    Past Medical History:  Diagnosis Date  . Acute epigastric pain   . GAD (generalized anxiety disorder)   . GERD (gastroesophageal reflux disease)   . Hypertension   . Non-cardiac chest pain      Patient Active Problem List   Diagnosis Date Noted  . Allergic rhinitis 03/20/2015  . Cold sore 03/20/2015  . Abnormal liver enzymes 03/20/2015  . 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 Hypertension 03/20/2015  . Diaphragmatic hernia 03/20/2015  . 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 Anxiety disorder 03/20/2015  . Acid reflux 09/23/2007  . Gastro-esophageal reflux disease without esophagitis 09/23/2007    Past Surgical History:  Procedure Laterality Date  . 24 HOUR PH STUDY N/A 01/06/2016   Procedure: 24 HOUR PH STUDY;  Surgeon: Midge Minium, MD;  Location: [facility] ENDOSCOPY;  Service: Endoscopy;  Laterality: N/A;  . ABDOMINAL HYSTERECTOMY     due to endometriosis  . ESOPHAGEAL MANOMETRY N/A 01/06/2016   Procedure: ESOPHAGEAL MANOMETRY (EM);  Surgeon: Midge Minium, MD;  Location: [facility] ENDOSCOPY;  Service: Endoscopy;  Laterality: N/A;  . ESOPHAGOGASTRODUODENOSCOPY    . ESOPHAGOGASTRODUODENOSCOPY (EGD) WITH PROPOFOL N/A 12/02/2015   Procedure: ESOPHAGOGASTRODUODENOSCOPY (EGD) WITH  PROPOFOL;  Surgeon: Scot Jun, MD;  Location: [facility] ENDOSCOPY;  Service: Endoscopy;  Laterality: N/A;  . NISSEN FUNDOPLICATION  2007   dr. Katrinka Blazing    Family History        Family Status  Relation Status  . Mother Alive  . Father Alive  . Brother Alive  . Brother Alive        Her family history includes Diabetes in her mother; Healthy in her brother and brother; Heart disease in her mother; Heart failure in her mother; Hypertension in her father and mother.     No Known Allergies   Current Outpatient Prescriptions:  .  cyclobenzaprine (FLEXERIL) 10 MG tablet, Take 1 tablet (10 mg total) by mouth 3 (three) times daily as needed for muscle spasms., Disp: 30 tablet, Rfl: 0 .  fluticasone (FLONASE) 50 MCG/ACT nasal spray, Place 2 sprays into both nostrils as needed. , Disp: , Rfl:  .  lisinopril (PRINIVIL,ZESTRIL) 10 MG tablet, Take 10 mg by mouth daily., Disp: , Rfl:  .  loratadine (CLARITIN) 10 MG tablet, Take 10 mg by mouth daily., Disp: , Rfl:  .  pantoprazole (PROTONIX) 40 MG tablet, Take 1 tablet (40 mg total) by mouth 2 (two) times daily. (Patient taking differently: Take 40 mg by mouth as needed. ), Disp: 60 tablet, Rfl: 1 .  SYNTHROID 50 MCG tablet, TK 1 T PO QD, Disp: , Rfl: 6 .  escitalopram (LEXAPRO) 10 MG tablet, Take 1 tablet (10 mg total) by mouth at bedtime. (Patient not taking: Reported on 09/12/2016), Disp: 30 tablet, Rfl: 3   Patient Care Team: Margaretann Loveless, PA-C as PCP - General (Family Medicine)      Objective:   Vitals: BP 102/64 (BP Location: Left Arm, Patient Position: Sitting, Cuff Size: Normal)   Pulse 68   Temp 98.5 F (36.9 C) (Oral)   Resp 16   Ht  (1.626 m)   Wt 135 lb (61.2 kg)   BMI 23.17 kg/m    Vitals:   09/12/16 0903  BP: 102/64  Pulse: 68  Resp: 16  Temp: 98.5 F (36.9 C)  TempSrc: Oral  Weight: 135 lb (61.2 kg)  Height:  (1.626 m)     Physical Exam  Constitutional: She is oriented to person, place, and time.  She appears well-developed and well-nourished. No distress.  HENT:  Head: Normocephalic and atraumatic.  Right Ear: Hearing, tympanic membrane, external ear and ear canal normal.  Left Ear: Hearing, tympanic membrane, external ear and ear canal normal.  Nose: Nose normal.  Mouth/Throat: Uvula is midline, oropharynx is clear and moist and mucous membranes are normal. No oropharyngeal exudate.  Eyes: Conjunctivae and EOM are normal. Pupils are equal, round, and reactive to light. Right eye exhibits no discharge. Left eye exhibits no discharge. No scleral icterus.  Neck: Normal range of motion. Neck supple. No JVD present. No tracheal deviation present. No thyromegaly present.  Cardiovascular: Normal rate, regular rhythm, normal heart sounds and intact distal pulses.  Exam reveals no gallop and no friction rub.  No murmur heard. Pulmonary/Chest: Effort normal and breath sounds normal. No respiratory distress. She has no wheezes. She has no rales. She exhibits no tenderness. Right breast exhibits no inverted nipple, no mass, no nipple discharge, no skin change and no tenderness. Left breast exhibits no inverted nipple, no mass, no nipple discharge, no skin change and no tenderness. Breasts are symmetrical.  Abdominal: Soft. Bowel sounds are normal. She exhibits no distension and no mass. There is no tenderness. There is no rebound and no guarding.  Musculoskeletal: Normal range of motion. She exhibits no edema or tenderness.  Lymphadenopathy:    She has no cervical adenopathy.  Neurological: She is alert and oriented to person, place, and time.  Skin: Skin is warm and dry. No rash noted. She is not diaphoretic.  Psychiatric: She has a normal mood and affect. Her behavior is normal. Judgment and thought content normal.  Vitals reviewed.    Depression Screen PHQ 2/9 Scores 09/12/2016 09/09/2015  PHQ - 2 Score 0 0  PHQ- 9 Score 1 -      Assessment & Plan:     Routine Health Maintenance and  Physical Exam  Exercise Activities and Dietary recommendations Goals    None      Immunization History  Administered Date(s) Administered  . Influenza,inj,Quad PF,36+ Mos 02/27/2016    Health Maintenance  Topic Date Due  . HIV Screening  10/06/1982  . TETANUS/TDAP  10/06/1986  . PAP SMEAR  09/12/2015  . INFLUENZA VACCINE  01/21/2017 (Originally 12/14/2016)     Discussed health benefits of physical activity, and encouraged her to engage in regular exercise appropriate for her age and condition.    1. Annual physical exam Normal physical exam today. Will check labs as below and f/u pending lab results. If labs are stable and WNL she will not need to have these rechecked for one year at her next annual physical exam. She is to call the office in the meantime if she has any acute issue, questions or concerns. - CBC w/Diff/Platelet - Comprehensive Metabolic Panel (CMET) - Lipid Profile  2. Breast cancer screening Breast exam today was normal. There is no family history of breast cancer. She does perform regular self breast exams. Mammogram was ordered as below. Information for [facility] clinic was given to patient so she may schedule her mammogram at her convenience. - MM Digital Screening; Future  3. Diaphragmatic hernia without obstruction and without gangrene Stable and much improved after repair of Nissen fundoplication at [facility]. Protonix refilled for as needed use. Does not require daily use now.  - pantoprazole (PROTONIX) 40 MG tablet; Take 1 tablet (40 mg total) by mouth 2 (two) times daily as needed.  Dispense: 60 tablet; Refill: 1  4. Encounter for lipid screening for cardiovascular disease Will check labs as below and f/u pending results. - Lipid Profile  5. Perimenopausal symptoms Patient having some mood swings and hot flashes but reports they are very mild. She is s/p hysterectomy so cannot judge menopausal onset with menstrual cycles. She does not wish to  check labs at this time as this would only be educational labs. We can base on her symptoms and control as needed.  6. Situational depression Stable and improved. Lexapro was d/c'd.  --------------------------------------------------------------------    Margaretann Loveless, PA-C  [facility]

## 2016-09-12 NOTE — Patient Instructions (Signed)
Health Maintenance, Female Adopting a healthy lifestyle and getting preventive care can go a long way to promote health and wellness. Talk with your health care provider about what schedule of regular examinations is right for you. This is a good chance for you to check in with your provider about disease prevention and staying healthy. In between checkups, there are plenty of things you can do on your own. Experts have done a lot of research about which lifestyle changes and preventive measures are most likely to keep you healthy. Ask your health care provider for more information. Weight and diet Eat a healthy diet  Be sure to include plenty of vegetables, fruits, low-fat dairy products, and lean protein.  Do not eat a lot of foods high in solid fats, added sugars, or salt.  Get regular exercise. This is one of the most important things you can do for your health.  Most adults should exercise for at least 150 minutes each week. The exercise should increase your heart rate and make you sweat (moderate-intensity exercise).  Most adults should also do strengthening exercises at least twice a week. This is in addition to the moderate-intensity exercise. Maintain a healthy weight  Body mass index (BMI) is a measurement that can be used to identify possible weight problems. It estimates body fat based on height and weight. Your health care provider can help determine your BMI and help you achieve or maintain a healthy weight.  For females 49 years of age and older:  A BMI below 18.5 is considered underweight.  A BMI of 18.5 to 24.9 is normal.  A BMI of 25 to 29.9 is considered overweight.  A BMI of 30 and above is considered obese. Watch levels of cholesterol and blood lipids  You should start having your blood tested for lipids and cholesterol at 49 years of age, then have this test every 5 years.  You may need to have your cholesterol levels checked more often if:  Your lipid or  cholesterol levels are high.  You are older than 49 years of age.  You are at high risk for heart disease. Cancer screening Lung Cancer  Lung cancer screening is recommended for adults 62-74 years old who are at high risk for lung cancer because of a history of smoking.  A yearly low-dose CT scan of the lungs is recommended for people who:  Currently smoke.  Have quit within the past 15 years.  Have at least a 30-pack-year history of smoking. A pack year is smoking an average of one pack of cigarettes a day for 1 year.  Yearly screening should continue until it has been 15 years since you quit.  Yearly screening should stop if you develop a health problem that would prevent you from having lung cancer treatment. Breast Cancer  Practice breast self-awareness. This means understanding how your breasts normally appear and feel.  It also means doing regular breast self-exams. Let your health care provider know about any changes, no matter how small.  If you are in your 20s or 30s, you should have a clinical breast exam (CBE) by a health care provider every 1-3 years as part of a regular health exam.  If you are 45 or older, have a CBE every year. Also consider having a breast X-ray (mammogram) every year.  If you have a family history of breast cancer, talk to your health care provider about genetic screening.  If you are at high risk for breast cancer, talk  to your health care provider about having an MRI and a mammogram every year.  Breast cancer gene (BRCA) assessment is recommended for women who have family members with BRCA-related cancers. BRCA-related cancers include:  Breast.  Ovarian.  Tubal.  Peritoneal cancers.  Results of the assessment will determine the need for genetic counseling and BRCA1 and BRCA2 testing. Cervical Cancer  Your health care provider may recommend that you be screened regularly for cancer of the pelvic organs (ovaries, uterus, and vagina).  This screening involves a pelvic examination, including checking for microscopic changes to the surface of your cervix (Pap test). You may be encouraged to have this screening done every 3 years, beginning at age 24.  For women ages 66-65, health care providers may recommend pelvic exams and Pap testing every 3 years, or they may recommend the Pap and pelvic exam, combined with testing for human papilloma virus (HPV), every 5 years. Some types of HPV increase your risk of cervical cancer. Testing for HPV may also be done on women of any age with unclear Pap test results.  Other health care providers may not recommend any screening for nonpregnant women who are considered low risk for pelvic cancer and who do not have symptoms. Ask your health care provider if a screening pelvic exam is right for you.  If you have had past treatment for cervical cancer or a condition that could lead to cancer, you need Pap tests and screening for cancer for at least 20 years after your treatment. If Pap tests have been discontinued, your risk factors (such as having a new sexual partner) need to be reassessed to determine if screening should resume. Some women have medical problems that increase the chance of getting cervical cancer. In these cases, your health care provider may recommend more frequent screening and Pap tests. Colorectal Cancer  This type of cancer can be detected and often prevented.  Routine colorectal cancer screening usually begins at 49 years of age and continues through 49 years of age.  Your health care provider may recommend screening at an earlier age if you have risk factors for colon cancer.  Your health care provider may also recommend using home test kits to check for hidden blood in the stool.  A small camera at the end of a tube can be used to examine your colon directly (sigmoidoscopy or colonoscopy). This is done to check for the earliest forms of colorectal cancer.  Routine  screening usually begins at age 41.  Direct examination of the colon should be repeated every 5-10 years through 49 years of age. However, you may need to be screened more often if early forms of precancerous polyps or small growths are found. Skin Cancer  Check your skin from head to toe regularly.  Tell your health care provider about any new moles or changes in moles, especially if there is a change in a mole's shape or color.  Also tell your health care provider if you have a mole that is larger than the size of a pencil eraser.  Always use sunscreen. Apply sunscreen liberally and repeatedly throughout the day.  Protect yourself by wearing long sleeves, pants, a wide-brimmed hat, and sunglasses whenever you are outside. Heart disease, diabetes, and high blood pressure  High blood pressure causes heart disease and increases the risk of stroke. High blood pressure is more likely to develop in:  People who have blood pressure in the high end of the normal range (130-139/85-89 mm Hg).  People who are overweight or obese.  People who are African American.  If you are 59-24 years of age, have your blood pressure checked every 3-5 years. If you are 34 years of age or older, have your blood pressure checked every year. You should have your blood pressure measured twice-once when you are at a hospital or clinic, and once when you are not at a hospital or clinic. Record the average of the two measurements. To check your blood pressure when you are not at a hospital or clinic, you can use:  An automated blood pressure machine at a pharmacy.  A home blood pressure monitor.  If you are between 29 years and 60 years old, ask your health care provider if you should take aspirin to prevent strokes.  Have regular diabetes screenings. This involves taking a blood sample to check your fasting blood sugar level.  If you are at a normal weight and have a low risk for diabetes, have this test once  every three years after 49 years of age.  If you are overweight and have a high risk for diabetes, consider being tested at a younger age or more often. Preventing infection Hepatitis B  If you have a higher risk for hepatitis B, you should be screened for this virus. You are considered at high risk for hepatitis B if:  You were born in a country where hepatitis B is common. Ask your health care provider which countries are considered high risk.  Your parents were born in a high-risk country, and you have not been immunized against hepatitis B (hepatitis B vaccine).  You have HIV or AIDS.  You use needles to inject street drugs.  You live with someone who has hepatitis B.  You have had sex with someone who has hepatitis B.  You get hemodialysis treatment.  You take certain medicines for conditions, including cancer, organ transplantation, and autoimmune conditions. Hepatitis C  Blood testing is recommended for:  Everyone born from [birth year] through 1963.  Anyone with known risk factors for hepatitis C. Sexually transmitted infections (STIs)  You should be screened for sexually transmitted infections (STIs) including gonorrhea and chlamydia if:  You are sexually active and are younger than 49 years of age.  You are older than 49 years of age and your health care provider tells you that you are at risk for this type of infection.  Your sexual activity has changed since you were last screened and you are at an increased risk for chlamydia or gonorrhea. Ask your health care provider if you are at risk.  If you do not have HIV, but are at risk, it may be recommended that you take a prescription medicine daily to prevent HIV infection. This is called pre-exposure prophylaxis (PrEP). You are considered at risk if:  You are sexually active and do not regularly use condoms or know the HIV status of your partner(s).  You take drugs by injection.  You are sexually active with a partner  who has HIV. Talk with your health care provider about whether you are at high risk of being infected with HIV. If you choose to begin PrEP, you should first be tested for HIV. You should then be tested every 3 months for as long as you are taking PrEP. Pregnancy  If you are premenopausal and you may become pregnant, ask your health care provider about preconception counseling.  If you may become pregnant, take 400 to 800 micrograms (mcg) of folic acid  every day.  If you want to prevent pregnancy, talk to your health care provider about birth control (contraception). Osteoporosis and menopause  Osteoporosis is a disease in which the bones lose minerals and strength with aging. This can result in serious bone fractures. Your risk for osteoporosis can be identified using a bone density scan.  If you are 4 years of age or older, or if you are at risk for osteoporosis and fractures, ask your health care provider if you should be screened.  Ask your health care provider whether you should take a calcium or vitamin D supplement to lower your risk for osteoporosis.  Menopause may have certain physical symptoms and risks.  Hormone replacement therapy may reduce some of these symptoms and risks. Talk to your health care provider about whether hormone replacement therapy is right for you. Follow these instructions at home:  Schedule regular health, dental, and eye exams.  Stay current with your immunizations.  Do not use any tobacco products including cigarettes, chewing tobacco, or electronic cigarettes.  If you are pregnant, do not drink alcohol.  If you are breastfeeding, limit how much and how often you drink alcohol.  Limit alcohol intake to no more than 1 drink per day for nonpregnant women. One drink equals 12 ounces of beer, 5 ounces of wine, or 1 ounces of hard liquor.  Do not use street drugs.  Do not share needles.  Ask your health care provider for help if you need support  or information about quitting drugs.  Tell your health care provider if you often feel depressed.  Tell your health care provider if you have ever been abused or do not feel safe at home. This information is not intended to replace advice given to you by your health care provider. Make sure you discuss any questions you have with your health care provider. Document Released: 11/15/2010 Document Revised: 10/08/2015 Document Reviewed: 02/03/2015 Elsevier Interactive Patient Education  2017 [employer].

## 2016-09-13 DIAGNOSIS — N951 Menopausal and female climacteric states: Secondary | ICD-10-CM | POA: Insufficient documentation

## 2016-09-14 ENCOUNTER — Telehealth: Payer: Self-pay

## 2016-09-14 LAB — CBC WITH DIFFERENTIAL/PLATELET
BASOS ABS: 0 10*3/uL (ref 0.0–0.2)
Basos: 0 %
EOS (ABSOLUTE): 0.4 10*3/uL (ref 0.0–0.4)
Eos: 8 %
HEMOGLOBIN: 12.5 g/dL (ref 11.1–15.9)
Hematocrit: 37.9 % (ref 34.0–46.6)
Immature Grans (Abs): 0 10*3/uL (ref 0.0–0.1)
Immature Granulocytes: 0 %
LYMPHS ABS: 2.1 10*3/uL (ref 0.7–3.1)
Lymphs: 39 %
MCH: 30 pg (ref 26.6–33.0)
MCHC: 33 g/dL (ref 31.5–35.7)
MCV: 91 fL (ref 79–97)
Monocytes Absolute: 0.5 10*3/uL (ref 0.1–0.9)
Monocytes: 8 %
NEUTROS ABS: 2.4 10*3/uL (ref 1.4–7.0)
Neutrophils: 45 %
PLATELETS: 251 10*3/uL (ref 150–379)
RBC: 4.16 x10E6/uL (ref 3.77–5.28)
RDW: 13.7 % (ref 12.3–15.4)
WBC: 5.3 10*3/uL (ref 3.4–10.8)

## 2016-09-14 LAB — LIPID PANEL
CHOL/HDL RATIO: 2.5 ratio (ref 0.0–4.4)
Cholesterol, Total: 211 mg/dL — ABNORMAL HIGH (ref 100–199)
HDL: 86 mg/dL (ref >39)
LDL Calculated: 106 mg/dL — ABNORMAL HIGH (ref 0–99)
Triglycerides: 95 mg/dL (ref 0–149)
VLDL Cholesterol Cal: 19 mg/dL (ref 5–40)

## 2016-09-14 LAB — COMPREHENSIVE METABOLIC PANEL
A/G RATIO: 1.6 (ref 1.2–2.2)
ALT: 38 IU/L — AB (ref 0–32)
AST: 37 IU/L (ref 0–40)
Albumin: 4.4 g/dL (ref 3.5–5.5)
Alkaline Phosphatase: 98 IU/L (ref 39–117)
BUN/Creatinine Ratio: 18 (ref 9–23)
BUN: 16 mg/dL (ref 6–24)
Bilirubin Total: 0.5 mg/dL (ref 0.0–1.2)
CALCIUM: 9.6 mg/dL (ref 8.7–10.2)
CO2: 27 mmol/L (ref 18–29)
Chloride: 102 mmol/L (ref 96–106)
Creatinine, Ser: 0.87 mg/dL (ref 0.57–1.00)
GFR, EST AFRICAN AMERICAN: 91 mL/min/{1.73_m2} (ref >59)
GFR, EST NON AFRICAN AMERICAN: 79 mL/min/{1.73_m2} (ref >59)
GLOBULIN, TOTAL: 2.7 g/dL (ref 1.5–4.5)
Glucose: 87 mg/dL (ref 65–99)
POTASSIUM: 4.4 mmol/L (ref 3.5–5.2)
SODIUM: 142 mmol/L (ref 134–144)
TOTAL PROTEIN: 7.1 g/dL (ref 6.0–8.5)

## 2016-09-14 NOTE — Telephone Encounter (Signed)
Advised pt of lab results. Pt verbally acknowledges understanding. Tami Williams, CMA   

## 2016-09-14 NOTE — Telephone Encounter (Signed)
-----   Message from Margaretann Loveless, [place] sent at 09/14/2016  9:48 AM EDT ----- Labs are fairly stable with exception that cholesterol is up from last year, but ASCVD risk is still low. Would recommend continue working on healthy lifestyle modifications with healthy dieting, limiting fatty foods, carbohydrates, sugars and red meats. Continue physical activity. Will recheck in one year.

## 2016-09-21 ENCOUNTER — Telehealth: Payer: Self-pay | Admitting: Physician Assistant

## 2016-09-21 ENCOUNTER — Other Ambulatory Visit: Payer: Self-pay | Admitting: Physician Assistant

## 2016-09-21 DIAGNOSIS — M25552 Pain in left hip: Secondary | ICD-10-CM

## 2016-09-21 DIAGNOSIS — M62830 Muscle spasm of back: Secondary | ICD-10-CM

## 2016-09-21 MED ORDER — METHYLPREDNISOLONE 4 MG PO TABS: ORAL_TABLET | ORAL | 0 refills | Status: DC

## 2016-09-21 NOTE — Telephone Encounter (Signed)
Patient advised.

## 2016-09-21 NOTE — Telephone Encounter (Signed)
Pt called saying she is still having left hip pain especially after she has been sitting to standing to walking.  She was in for a PE last week and you told her to call back if it got worse.  Please advise  [phone]  Thanks, Barth Kirksteri

## 2016-09-21 NOTE — Telephone Encounter (Signed)
Patient states that she is taking Aleve BID.

## 2016-09-21 NOTE — Telephone Encounter (Signed)
Can we see if she has been using aleve/naproxen for it? If not we can try meloxicam again vs trying a steroid taper. Which does she prefer?

## 2016-09-21 NOTE — Telephone Encounter (Signed)
Please review chart and advise. KW 

## 2016-09-21 NOTE — Telephone Encounter (Signed)
Ok will send in Rx for a prednisone taper to see if this helps. She is to call if no improvement.

## 2016-10-14 ENCOUNTER — Telehealth: Payer: Self-pay | Admitting: Physician Assistant

## 2016-10-14 ENCOUNTER — Ambulatory Visit
Admission: RE | Admit: 2016-10-14 | Discharge: 2016-10-14 | Disposition: A | Discharge status: Home / Self Care | Payer: BC Managed Care – PPO | Source: Ambulatory Visit | Attending: Physician Assistant | Admitting: Physician Assistant

## 2016-10-14 DIAGNOSIS — M25552 Pain in left hip: Secondary | ICD-10-CM

## 2016-10-14 NOTE — Telephone Encounter (Signed)
Patient advised as directed below.  Thanks,  -Anneth Brunell 

## 2016-10-14 NOTE — Telephone Encounter (Signed)
Pt states she is still having hip pain and her hip feels like it is locking up.  Pt states it hurts all the time now.  Pt is asking what is the next step to see why she is having this pain.  CB#[phone]/MW

## 2016-10-14 NOTE — Telephone Encounter (Signed)
Please review-aa 

## 2016-10-14 NOTE — Telephone Encounter (Signed)
Will order hip xray. I will call with results.

## 2016-10-17 ENCOUNTER — Telehealth: Payer: Self-pay

## 2016-10-17 DIAGNOSIS — M25552 Pain in left hip: Secondary | ICD-10-CM

## 2016-10-17 NOTE — Telephone Encounter (Signed)
-----   Message from Margaretann LovelessJennifer M Burnette, PA-C sent at 10/15/2016  9:15 AM EDT ----- There is mild arthritis noted but no other abnormalities. Would recommend ortho referral if patient agreeable.

## 2016-10-17 NOTE — Telephone Encounter (Signed)
Patient advised as below. Patient verbalizes understanding and is in agreement with treatment plan.  

## 2016-10-24 ENCOUNTER — Ambulatory Visit
Admission: RE | Admit: 2016-10-24 | Discharge: 2016-10-24 | Disposition: A | Discharge status: Home / Self Care | Payer: BC Managed Care – PPO | Source: Ambulatory Visit | Attending: Physician Assistant | Admitting: Physician Assistant

## 2016-10-24 ENCOUNTER — Telehealth: Payer: Self-pay

## 2016-10-24 DIAGNOSIS — M5442 Lumbago with sciatica, left side: Secondary | ICD-10-CM

## 2016-10-24 MED ORDER — PREDNISONE 20 MG PO TABS: ORAL_TABLET | ORAL | 0 refills | Status: DC

## 2016-10-24 NOTE — Telephone Encounter (Signed)
Pt advised and is agreeable with treatment plan. Allene DillonEmily Drozdowski, CMA

## 2016-10-24 NOTE — Telephone Encounter (Signed)
We can get an xray of the lumbar spine and do a longer trial of steroid to see if it will relieve inflammation.

## 2016-10-24 NOTE — Telephone Encounter (Signed)
Pt called because her back pain is worsening. She had hip XR on 10/14/2016, which was negative except for degenerative changes. She states this pain is not arthritic. The pain in her back is located on the right side of the midline, but radiates into the left hip. The pain is catching. It is worse when going from sitting to standing. She states she is concerned because her husband injured his back, which caused sciatica. She was prescribed Prednisone, with relief. She does not want to see more specialists, if possible. Please advise. Allene DillonEmily Drozdowski, CMA

## 2016-10-24 NOTE — Telephone Encounter (Signed)
Xray ordered and prednisone taper sent in

## 2016-10-25 NOTE — Telephone Encounter (Signed)
Patient advised as below. Patient reports that she has an appointment with ortho on Thursday, but just started prednisone yesterday. Patient reports symptoms have improved. Patient wants to know if she needs to keep ortho appointment?

## 2016-10-25 NOTE — Telephone Encounter (Signed)
-----   Message from Margaretann LovelessJennifer M Burnette, [place]PA-C sent at 10/25/2016  9:06 AM EDT ----- Lumbar spine is unremarkable. No DDD or malalignment noted.

## 2016-10-26 NOTE — Telephone Encounter (Signed)
I am worried that the pain may return again when she completes the prednisone. I think it may be beneficial to see the orthopedic because if the pain returns she is most likely going to need an MRI that they would order. Xrays wont show soft tissue changes so if pain continues to return she may have something with the cartilage of the hip vs possibly even a disc bulge in the back. In short I feel it would be beneficial to have an ortho involved in care just in case.

## 2016-10-26 NOTE — Telephone Encounter (Signed)
Patient advised. She states she will keep Ortho appointment for Thursday.

## 2016-11-20 ENCOUNTER — Other Ambulatory Visit: Payer: Self-pay | Admitting: Physician Assistant

## 2016-11-20 DIAGNOSIS — K449 Diaphragmatic hernia without obstruction or gangrene: Secondary | ICD-10-CM

## 2016-12-07 DIAGNOSIS — K828 Other specified diseases of gallbladder: Secondary | ICD-10-CM | POA: Insufficient documentation

## 2016-12-29 IMAGING — CR DG UGI W/O KUB
1 series · 4 of 4 positions shown · non-contrast
Comparison: None.

CLINICAL DATA: Patient status post Nissen fundoplication.
Gastroesophageal reflux disease with lower chest and upper abdominal
pain

EXAM:
UPPER GI SERIES WITH KUB
TECHNIQUE: After obtaining a scout radiograph a routine upper GI series was
performed using thin barium.
FLUOROSCOPY TIME:  Radiation Exposure Index (as provided by the
fluoroscopic device): 46.60 mGy

[Series 20: t abdomen supine · 0.14mm/px · 4 of 4 slices shown]
[im 1/4]
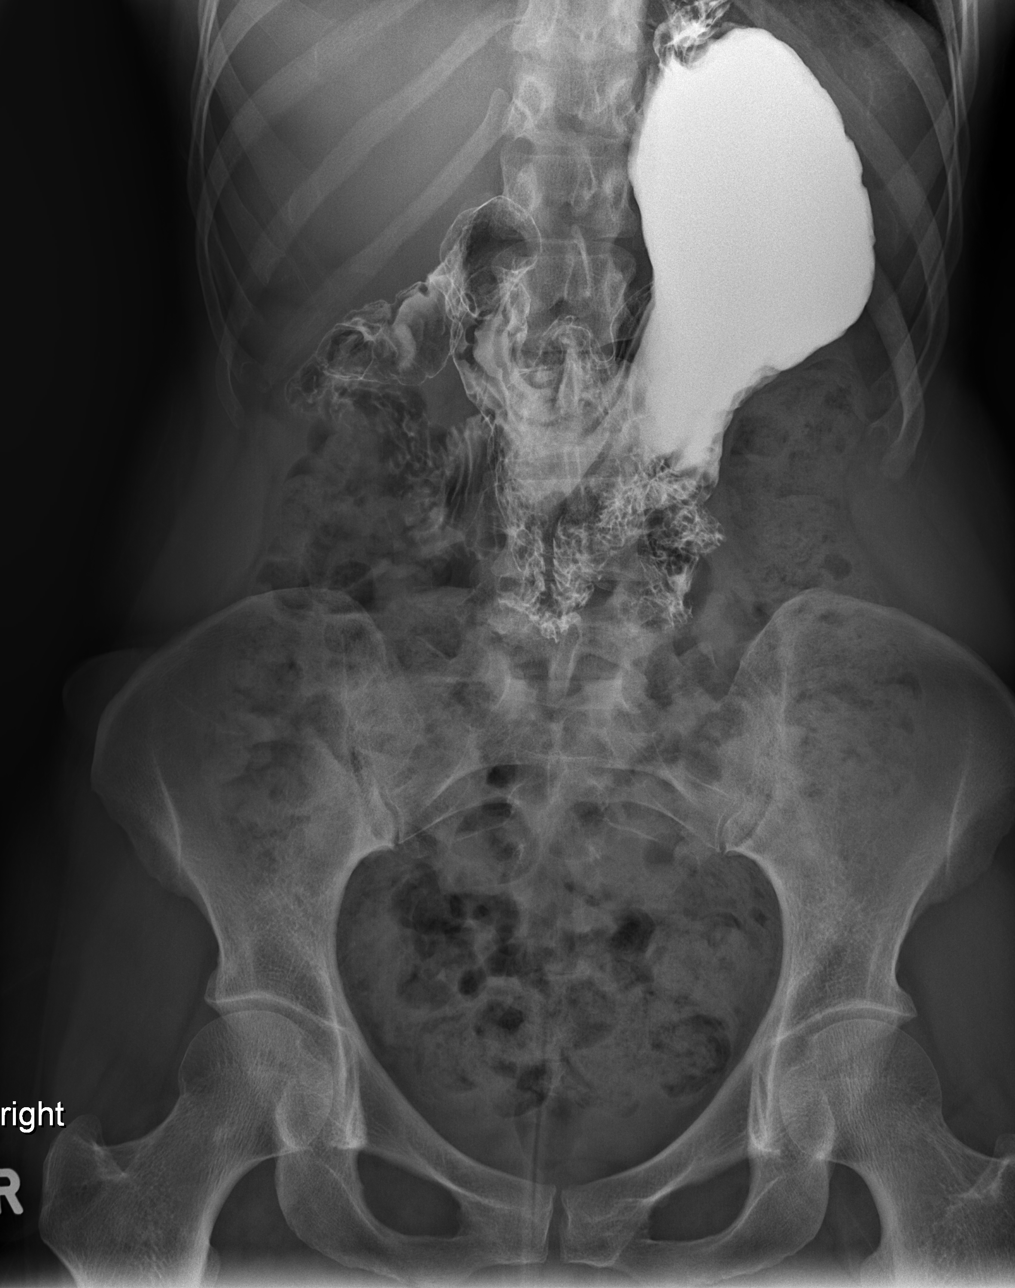
[im 2/4]
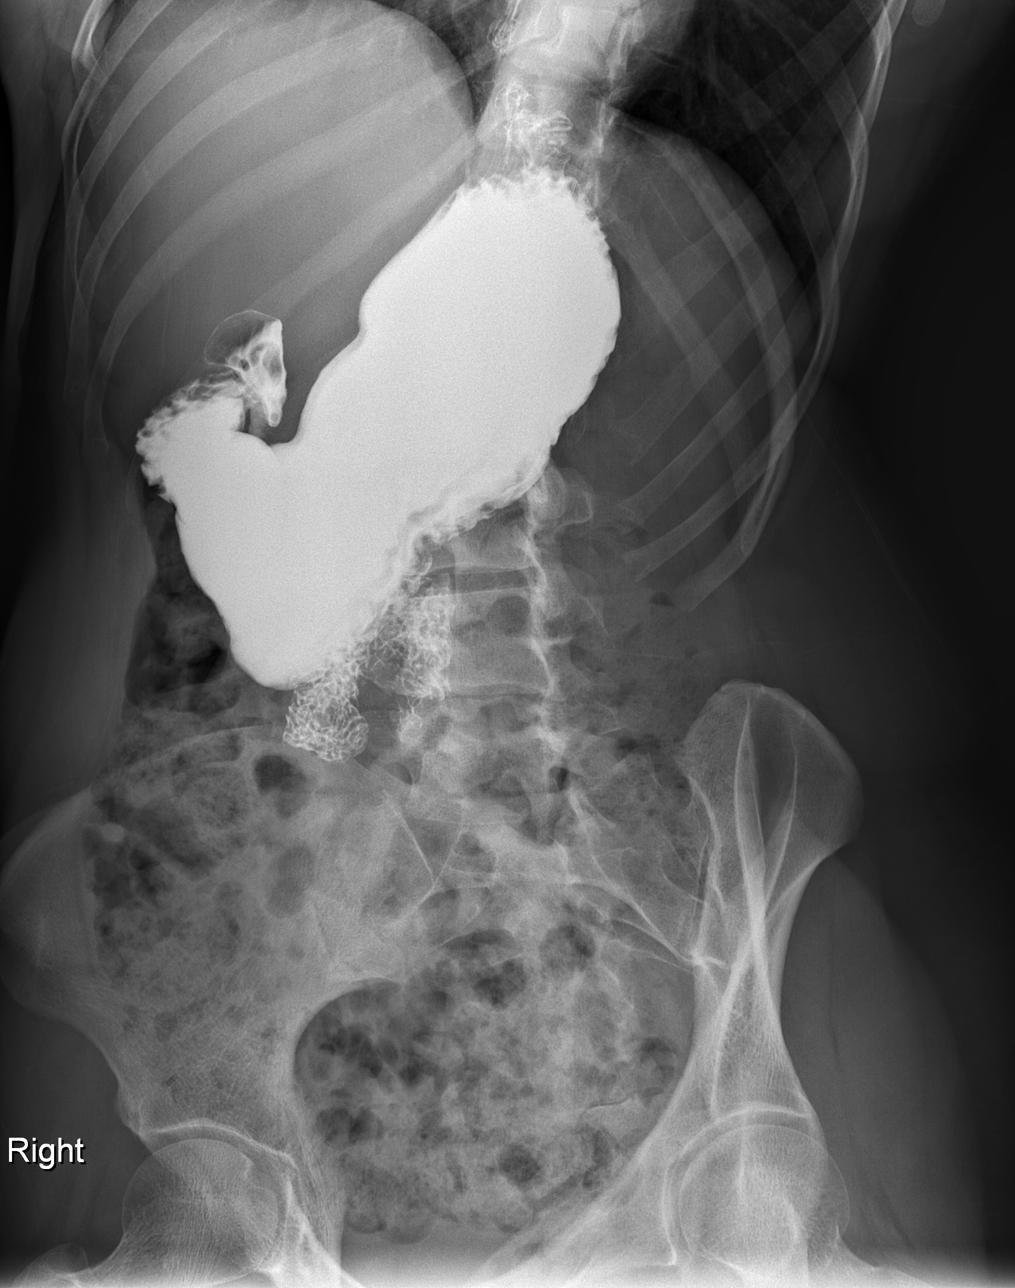
[im 3/4]
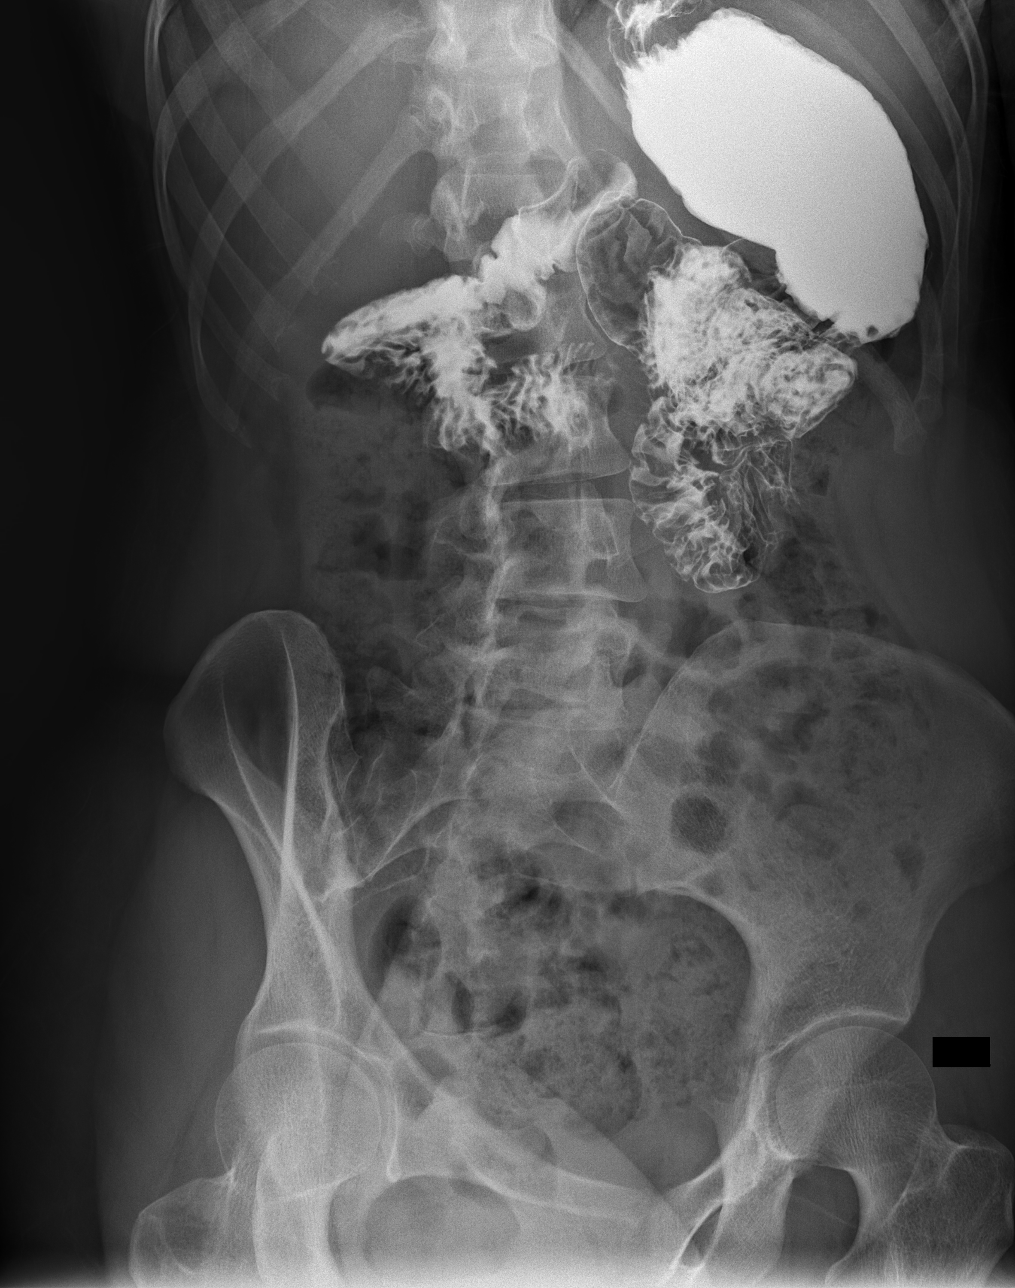
[im 4/4]
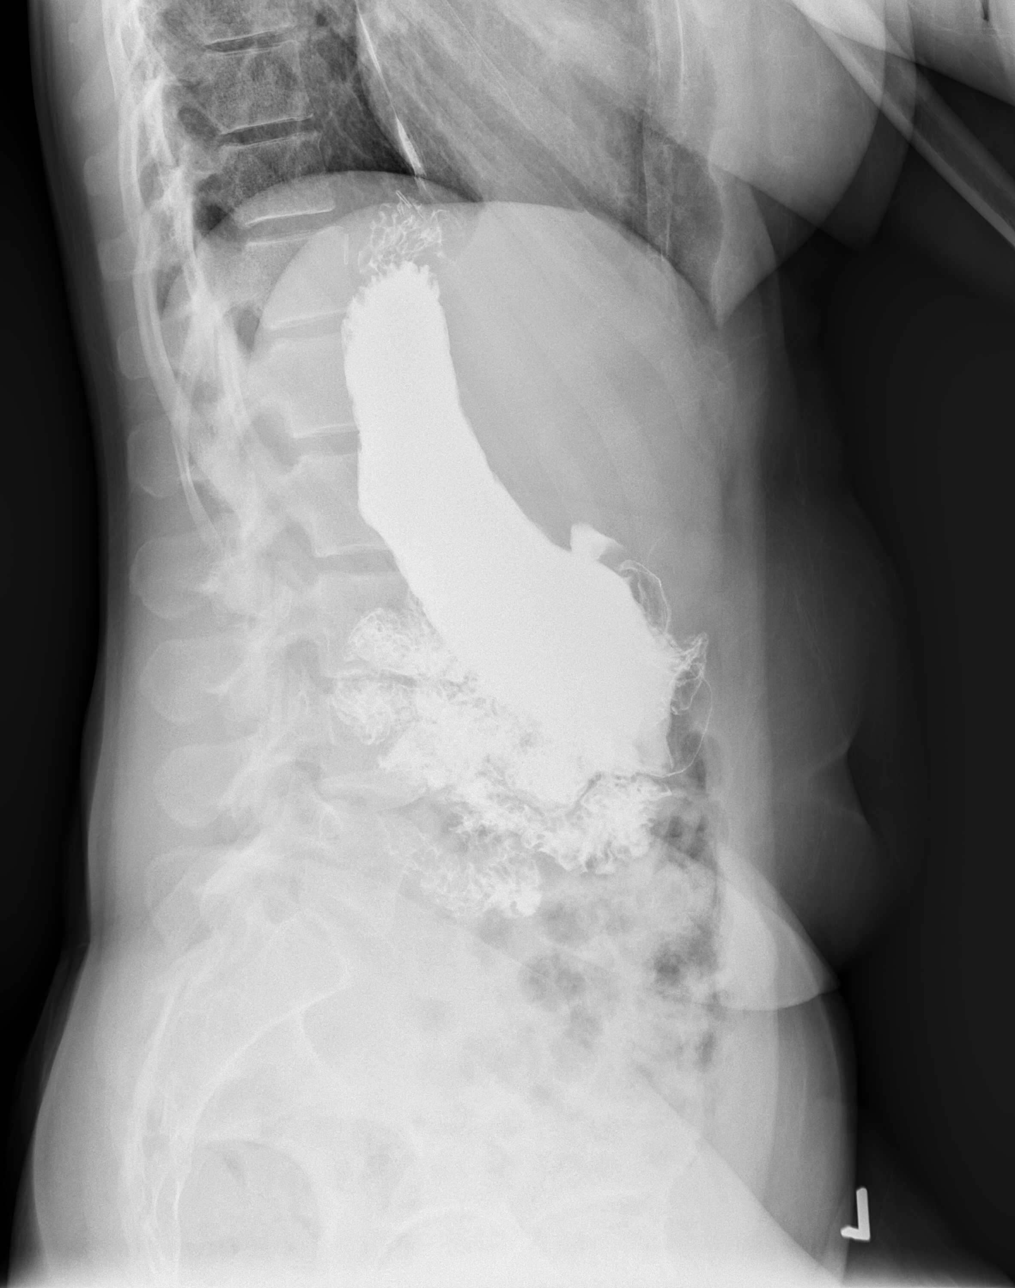

[4 of 4 positions shown; findings below may reference images not displayed]

FINDINGS: Preliminary abdomen radiograph shows a normal gas pattern. There are
surgical clips at the gastroesophageal junction.

Esophagus, stomach, and duodenum were visualized. Swallowing and
peristalsis are normal. There is no appreciable hiatal hernia,
reflux, or stricture. No esophageal mass or ulceration is evident.

There is a Nissen fundoplication defect in the gastric cardia
region. There is fold thickening along the greater curvature of the
stomach consistent with a degree of gastritis. There is no gastric
mass or ulceration.

The duodenum appears normal without appreciable mass or ulceration.
Contrast flows freely through the duodenum into the proximal
jejunum.
IMPRESSION: Nissen fundoplication defect in the gastric cardia. There is wall
thickening along the gastric greater curvature consistent a degree
of gastritis.

There is no esophageal hiatal hernia, reflux, or stricture evident.
Esophagus appears unremarkable. Duodenum appears normal.

## 2017-02-16 ENCOUNTER — Telehealth: Payer: Self-pay | Admitting: Physician Assistant

## 2017-02-16 NOTE — Telephone Encounter (Signed)
Error/MW °

## 2017-02-17 ENCOUNTER — Ambulatory Visit: Payer: BC Managed Care – PPO | Admitting: Physician Assistant

## 2017-02-22 ENCOUNTER — Encounter: Payer: Self-pay | Admitting: Physician Assistant

## 2017-02-22 ENCOUNTER — Ambulatory Visit (INDEPENDENT_AMBULATORY_CARE_PROVIDER_SITE_OTHER): Payer: BC Managed Care – PPO | Admitting: Physician Assistant

## 2017-02-22 VITALS — BP 156/100 | HR 71 | Temp 98.1°F | Resp 16 | Ht 64.0 in | Wt 133.8 lb

## 2017-02-22 DIAGNOSIS — M62830 Muscle spasm of back: Secondary | ICD-10-CM | POA: Diagnosis not present

## 2017-02-22 DIAGNOSIS — F411 Generalized anxiety disorder: Secondary | ICD-10-CM

## 2017-02-22 DIAGNOSIS — F321 Major depressive disorder, single episode, moderate: Secondary | ICD-10-CM

## 2017-02-22 DIAGNOSIS — I10 Essential (primary) hypertension: Secondary | ICD-10-CM

## 2017-02-22 DIAGNOSIS — Z23 Encounter for immunization: Secondary | ICD-10-CM

## 2017-02-22 MED ORDER — CYCLOBENZAPRINE HCL 10 MG PO TABS
ORAL_TABLET | ORAL | 0 refills | Status: DC
Start: 2017-02-22 — End: 2018-02-22

## 2017-02-22 MED ORDER — PAROXETINE HCL 20 MG PO TABS: 20.0000 mg | ORAL_TABLET | Freq: Every day | ORAL | 1 refills | Status: DC

## 2017-02-22 NOTE — Progress Notes (Signed)
Patient: Tami Williams Female    DOB: Sep 26, 1965   51 y.o.   MRN: [id number] Visit Date: 02/22/2017  Today's Provider: Margaretann Loveless, PA-C   Chief Complaint  Patient presents with  . Anxiety  . Depression  . Hypertension   Subjective:    HPI Patient here today C/O anxiety/depression worsening. Patient reports that she D/C'd Lexapro due to getting life insurance. Patient reports that the company was asking for too much information about her stress that could cause her to not get coverage thus she just stopped taking the medication to not have to answer questions. Patient reports that her stress has increased due to family medical problems.   Her husband was hospitalized last year for a perforated diverticulitis that required partial colectomy resulting in a colostomy. They then had complications with reversal which required him to have another colostomy. That was then successfully reversed. He then develop an incisional hernia that had to be repaired. Then had his gallbladder removed. During this time she also had to have her gallbladder removed. Due to all the medical issues with her husband he unfortunately lost his job and has been unable to obtain disability currently. They are under increased financial stress with medical bills. She has started a 2nd part time job in the evenings to help out financially. Her husband is also working part time in the evenings cleaning a sweepstakes facility. This has caused her stress as she feels they never get to see each other since there are some nights he is working until 2 am or later. This has been difficult she says as they have always done everything together.   Patient reports that she also D/C'd Lisinopril about 4 months ago, pt reports that she just doesn't like taking medication. Patient denies chest pain, shortness of breath or swelling. Patient reports that she has been checking BP at home. Patient reports that her BP has been  elevated some, Patient reports that BP ranges from 130's/80's.      No Known Allergies   Current Outpatient Prescriptions:  .  cyclobenzaprine (FLEXERIL) 10 MG tablet, TAKE 1 TABLET(10 MG) BY MOUTH THREE TIMES DAILY AS NEEDED FOR MUSCLE SPASMS, Disp: 30 tablet, Rfl: 0 .  fluticasone (FLONASE) 50 MCG/ACT nasal spray, Place 2 sprays into both nostrils as needed. , Disp: , Rfl:  .  loratadine (CLARITIN) 10 MG tablet, Take 10 mg by mouth daily., Disp: , Rfl:  .  pantoprazole (PROTONIX) 40 MG tablet, TAKE 1 TABLET(40 MG) BY MOUTH TWICE DAILY AS NEEDED, Disp: 60 tablet, Rfl: 5 .  SYNTHROID 50 MCG tablet, TK 1 T PO QD, Disp: , Rfl: 6 .  lisinopril (PRINIVIL,ZESTRIL) 10 MG tablet, Take 10 mg by mouth daily., Disp: , Rfl:   Review of Systems  Constitutional: Negative.   Respiratory: Negative.   Cardiovascular: Negative.   Gastrointestinal: Negative.   Genitourinary: Negative.   Neurological: Negative.   Psychiatric/Behavioral: Positive for agitation, decreased concentration, dysphoric mood and sleep disturbance. Negative for behavioral problems, confusion, hallucinations, self-injury and suicidal ideas. The patient is nervous/anxious. The patient is not hyperactive.     Social History  Substance Use Topics  . Smoking status: Never Smoker  . Smokeless tobacco: Never Used  . Alcohol use 0.0 oz/week     Comment: occasional   Objective:   BP (!) 156/100 (BP Location: Left Arm, Patient Position: Sitting, Cuff Size: Normal)   Pulse 71   Temp 98.1 F (36.7 C) (Oral)  Resp 16   Ht  (1.626 m)   Wt 133 lb 12.8 oz (60.7 kg)   SpO2 99%   BMI 22.97 kg/m  Vitals:   02/22/17 1359  BP: (!) 156/100  Pulse: 71  Resp: 16  Temp: 98.1 F (36.7 C)  TempSrc: Oral  SpO2: 99%  Weight: 133 lb 12.8 oz (60.7 kg)  Height:  (1.626 m)   Depression screen [facility] 2/9 02/22/2017 09/12/2016 09/09/2015  Decreased Interest 1 0 0  Down, Depressed, Hopeless 2 0 0  PHQ - 2 Score 3 0 0  Altered  sleeping 3 0 -  Tired, decreased energy 2 1 -  Change in appetite 2 0 -  Feeling bad or failure about yourself  0 0 -  Trouble concentrating 0 0 -  Moving slowly or fidgety/restless 0 0 -  Suicidal thoughts 0 0 -  PHQ-9 Score 10 1 -  Difficult doing work/chores Not difficult at all Not difficult at all -   GAD 7 : Generalized Anxiety Score 02/22/2017 07/21/2016  Nervous, Anxious, on Edge 3 2  Control/stop worrying 3 1  Worry too much - different things 3 2  Trouble relaxing 1 2  Restless 0 0  Easily annoyed or irritable 3 3  Afraid - awful might happen 2 0  Total GAD 7 Score 15 10  Anxiety Difficulty - Not difficult at all      Physical Exam  Constitutional: She appears well-developed and well-nourished. No distress.  Neck: Normal range of motion. Neck supple. No tracheal deviation present. No thyromegaly present.  Cardiovascular: Normal rate, regular rhythm and normal heart sounds.  Exam reveals no gallop and no friction rub.   No murmur heard. Pulmonary/Chest: Effort normal and breath sounds normal. No respiratory distress. She has no wheezes. She has no rales.  Lymphadenopathy:    She has no cervical adenopathy.  Skin: She is not diaphoretic.  Psychiatric: Her speech is normal and behavior is normal. Judgment and thought content normal. Her mood appears anxious. Cognition and memory are normal. She exhibits a depressed mood.  Vitals reviewed.       Assessment & Plan:     1. Depression, major, single episode, moderate (HCC) Worsening. Will switch to Paxil as below per patient request. I will see her back in 4-6 weeks to see how medication is working for her. She is to call if symptoms worsen in the meantime.  - PARoxetine (PAXIL) 20 MG tablet; Take 1 tablet (20 mg total) by mouth daily. Start with 0.5 tab x 1 week then increase to 1 tab daily  Dispense: 90 tablet; Refill: 1  2. GAD (generalized anxiety disorder) See above medical treatment plan. - PARoxetine (PAXIL) 20  MG tablet; Take 1 tablet (20 mg total) by mouth daily. Start with 0.5 tab x 1 week then increase to 1 tab daily  Dispense: 90 tablet; Refill: 1  3. Muscle spasm of back Stable. Diagnosis pulled for medication refill. Continue current medical treatment plan. Muscle spasms caused by DDD.  - cyclobenzaprine (FLEXERIL) 10 MG tablet; TAKE 1 TABLET(10 MG) BY MOUTH THREE TIMES DAILY AS NEEDED FOR MUSCLE SPASMS  Dispense: 30 tablet; Refill: 0  4. Essential hypertension Elevated today. Patient feels secondary to anxiety. Patient has been checking BP at home. She is going to monitor BP at home with starting Paxil and see if better controlled. If not, she will restart Lisinopril which she already has at home.   5. Need for Tdap vaccination  Tdap Vaccine given to patient without complications. Patient sat for 15 minutes after administration and was tolerated well without adverse effects. - Tdap vaccine greater than or equal to 7yo IM  6. Need for influenza vaccination Flu vaccine given today without complication. Patient sat upright for 15 minutes to check for adverse reaction before being released. - Flu Vaccine QUAD 36+ mos IM       Margaretann Loveless, PA-C  [facility]

## 2017-02-22 NOTE — Patient Instructions (Signed)
Paroxetine tablets What is this medicine? PAROXETINE (pa ROX e teen) is used to treat depression. It may also be used to treat anxiety disorders, obsessive compulsive disorder, panic attacks, post traumatic stress, and premenstrual dysphoric disorder (PMDD). This medicine may be used for other purposes; ask your health care provider or pharmacist if you have questions. COMMON BRAND NAME(S): Paxil, Pexeva What should I tell my health care provider before I take this medicine? They need to know if you have any of these conditions: -bipolar disorder or a family history of bipolar disorder -bleeding disorders -glaucoma -heart disease -kidney disease -liver disease -low levels of sodium in the blood -seizures -suicidal thoughts, plans, or attempt; a previous suicide attempt by you or a family member -take MAOIs like Carbex, Eldepryl, Marplan, Nardil, and Parnate -take medicines that treat or prevent blood clots -thyroid disease -an unusual or allergic reaction to paroxetine, other medicines, foods, dyes, or preservatives -pregnant or trying to get pregnant -breast-feeding How should I use this medicine? Take this medicine by mouth with a glass of water. Follow the directions on the prescription label. You can take it with or without food. Take your medicine at regular intervals. Do not take your medicine more often than directed. Do not stop taking this medicine suddenly except upon the advice of your doctor. Stopping this medicine too quickly may cause serious side effects or your condition may worsen. A special MedGuide will be given to you by the pharmacist with each prescription and refill. Be sure to read this information carefully each time. Talk to your pediatrician regarding the use of this medicine in children. Special care may be needed. Overdosage: If you think you have taken too much of this medicine contact a poison control center or emergency room at once. NOTE: This medicine is  only for you. Do not share this medicine with others. What if I miss a dose? If you miss a dose, take it as soon as you can. If it is almost time for your next dose, take only that dose. Do not take double or extra doses. What may interact with this medicine? Do not take this medicine with any of the following medications: -linezolid -MAOIs like Carbex, Eldepryl, Marplan, Nardil, and Parnate -methylene blue (injected into a vein) -pimozide -thioridazine This medicine may also interact with the following medications: -alcohol -amphetamines -aspirin and aspirin-like medicines -atomoxetine -certain medicines for depression, anxiety, or psychotic disturbances -certain medicines for irregular heart beat like propafenone, flecainide, encainide, and quinidine -certain medicines for migraine headache like almotriptan, eletriptan, frovatriptan, naratriptan, rizatriptan, sumatriptan, zolmitriptan -cimetidine -digoxin -diuretics -fentanyl -fosamprenavir -furazolidone -isoniazid -lithium -medicines that treat or prevent blood clots like warfarin, enoxaparin, and dalteparin -medicines for sleep -NSAIDs, medicines for pain and inflammation, like ibuprofen or naproxen -phenobarbital -phenytoin -procarbazine -rasagiline -ritonavir -supplements like St. John's wort, kava kava, valerian -tamoxifen -tramadol -tryptophan This list may not describe all possible interactions. Give your health care provider a list of all the medicines, herbs, non-prescription drugs, or dietary supplements you use. Also tell them if you smoke, drink alcohol, or use illegal drugs. Some items may interact with your medicine. What should I watch for while using this medicine? Tell your doctor if your symptoms do not get better or if they get worse. Visit your doctor or health care professional for regular checks on your progress. Because it may take several weeks to see the full effects of this medicine, it is important  to continue your treatment as prescribed by your doctor.   Patients and their families should watch out for new or worsening thoughts of suicide or depression. Also watch out for sudden changes in feelings such as feeling anxious, agitated, panicky, irritable, hostile, aggressive, impulsive, severely restless, overly excited and hyperactive, or not being able to sleep. If this happens, especially at the beginning of treatment or after a change in dose, call your health care professional. You may get drowsy or dizzy. Do not drive, use machinery, or do anything that needs mental alertness until you know how this medicine affects you. Do not stand or sit up quickly, especially if you are an older patient. This reduces the risk of dizzy or fainting spells. Alcohol may interfere with the effect of this medicine. Avoid alcoholic drinks. Your mouth may get dry. Chewing sugarless gum or sucking hard candy, and drinking plenty of water will help. Contact your doctor if the problem does not go away or is severe. What side effects may I notice from receiving this medicine? Side effects that you should report to your doctor or health care professional as soon as possible: -allergic reactions like skin rash, itching or hives, swelling of the face, lips, or tongue -anxious -black, tarry stools -changes in vision -confusion -elevated mood, decreased need for sleep, racing thoughts, impulsive behavior -eye pain -fast, irregular heartbeat -feeling faint or lightheaded, falls -feeling agitated, angry, or irritable -hallucination, loss of contact with reality -loss of balance or coordination -loss of memory -painful or prolonged erections -restlessness, pacing, inability to keep still -seizures -stiff muscles -suicidal thoughts or other mood changes -trouble sleeping -unusual bleeding or bruising -unusually weak or tired -vomiting Side effects that usually do not require medical attention (report to your  doctor or health care professional if they continue or are bothersome): -change in appetite or weight -change in sex drive or performance -diarrhea -dizziness -dry mouth -increased sweating -indigestion, nausea -tired -tremors This list may not describe all possible side effects. Call your doctor for medical advice about side effects. You may report side effects to FDA at 1-800-FDA-1088. Where should I keep my medicine? Keep out of the reach of children. Store at room temperature between 15 and 30 degrees C (59 and 86 degrees F). Keep container tightly closed. Throw away any unused medicine after the expiration date. NOTE: This sheet is a summary. It may not cover all possible information. If you have questions about this medicine, talk to your doctor, pharmacist, or health care provider.  2018 Elsevier/Gold Standard (2015-10-03 15:50:32)  

## 2017-02-28 ENCOUNTER — Encounter: Payer: Self-pay | Admitting: Physician Assistant

## 2017-02-28 DIAGNOSIS — I10 Essential (primary) hypertension: Secondary | ICD-10-CM

## 2017-03-01 MED ORDER — LISINOPRIL 10 MG PO TABS: 10.0000 mg | ORAL_TABLET | Freq: Every day | ORAL | 1 refills | Status: DC

## 2017-03-13 ENCOUNTER — Telehealth: Payer: Self-pay | Admitting: *Deleted

## 2017-03-13 ENCOUNTER — Encounter: Payer: Self-pay | Admitting: Podiatry

## 2017-03-13 ENCOUNTER — Ambulatory Visit (INDEPENDENT_AMBULATORY_CARE_PROVIDER_SITE_OTHER): Payer: BC Managed Care – PPO | Admitting: Podiatry

## 2017-03-13 ENCOUNTER — Ambulatory Visit (INDEPENDENT_AMBULATORY_CARE_PROVIDER_SITE_OTHER): Payer: BC Managed Care – PPO

## 2017-03-13 DIAGNOSIS — M2012 Hallux valgus (acquired), left foot: Secondary | ICD-10-CM

## 2017-03-13 NOTE — Progress Notes (Signed)
Subjective:  Patient ID: Tami Williams, female    DOB: Jun 06, 1965,  MRN: [id number] HPI Chief Complaint  Patient presents with  . Foot Pain    1st MPJ left - aching and cramping x 1 month, trouble wearing enclosed shoes    51 y.o. female presents with the above complaint.     Past Medical History:  Diagnosis Date  . Acute epigastric pain   . GAD (generalized anxiety disorder)   . GERD (gastroesophageal reflux disease)   . Hypertension   . Non-cardiac chest pain    Past Surgical History:  Procedure Laterality Date  . 24 HOUR PH STUDY N/A 01/06/2016   Procedure: 24 HOUR PH STUDY;  Surgeon: Midge Minium, MD;  Location: [facility] ENDOSCOPY;  Service: Endoscopy;  Laterality: N/A;  . ABDOMINAL HYSTERECTOMY     due to endometriosis  . ESOPHAGEAL MANOMETRY N/A 01/06/2016   Procedure: ESOPHAGEAL MANOMETRY (EM);  Surgeon: Midge Minium, MD;  Location: [facility] ENDOSCOPY;  Service: Endoscopy;  Laterality: N/A;  . ESOPHAGOGASTRODUODENOSCOPY    . ESOPHAGOGASTRODUODENOSCOPY (EGD) WITH PROPOFOL N/A 12/02/2015   Procedure: ESOPHAGOGASTRODUODENOSCOPY (EGD) WITH PROPOFOL;  Surgeon: Scot Jun, MD;  Location[facility] ENDOSCOPY;  Service: Endoscopy;  Laterality: N/A;  . NISSEN FUNDOPLICATION  2007   dr. Katrinka Blazing    Current Outpatient Prescriptions:  .  cyclobenzaprine (FLEXERIL) 10 MG tablet, TAKE 1 TABLET(10 MG) BY MOUTH THREE TIMES DAILY AS NEEDED FOR MUSCLE SPASMS, Disp: 30 tablet, Rfl: 0 .  fluticasone (FLONASE) 50 MCG/ACT nasal spray, Place 2 sprays into both nostrils as needed. , Disp: , Rfl:  .  lisinopril (PRINIVIL,ZESTRIL) 10 MG tablet, Take 1 tablet (10 mg total) by mouth daily., Disp: 90 tablet, Rfl: 1 .  loratadine (CLARITIN) 10 MG tablet, Take 10 mg by mouth daily., Disp: , Rfl:  .  pantoprazole (PROTONIX) 40 MG tablet, TAKE 1 TABLET(40 MG) BY MOUTH TWICE DAILY AS NEEDED, Disp: 60 tablet, Rfl: 5 .  PARoxetine (PAXIL) 20 MG tablet, Take 1 tablet (20 mg total) by mouth daily. Start with 0.5 tab x  1 week then increase to 1 tab daily, Disp: 90 tablet, Rfl: 1 .  SYNTHROID 50 MCG tablet, TK 1 T PO QD, Disp: , Rfl: 6  Allergies  Allergen Reactions  . No Known Allergies    Review of Systems  All other systems reviewed and are negative.  Objective:  There were no vitals filed for this visit.  General: Well developed, nourished, in no acute distress, alert and oriented x3   Dermatological: Skin is warm, dry and supple bilateral. Nails x 10 are well maintained; remaining integument appears unremarkable at this time. There are no open sores, no preulcerative lesions, no rash or signs of infection present.  Vascular: Dorsalis Pedis artery and Posterior Tibial artery pedal pulses are 2/4 bilateral with immedate capillary fill time. Pedal hair growth present. No varicosities and no lower extremity edema present bilateral.   Neruologic: Grossly intact via light touch bilateral. Vibratory intact via tuning fork bilateral. Protective threshold with Semmes Wienstein monofilament intact to all pedal sites bilateral. Patellar and Achilles deep tendon reflexes 2+ bilateral. No Babinski or clonus noted bilateral.   Musculoskeletal: No gross boney pedal deformities bilateral. No pain, crepitus, or limitation noted with foot and ankle range of motion bilateral. Muscular strength 5/5 in all groups tested bilateral.She presents pain on palpation first metatarsophalangeal joint left foot. Limited range of motion to about 40 of dorsiflexion. Large hypertrophic medial condyle with neuritis. The area is erythematous  there is no ecchymosis. Radiographs taken today do demonstrate elongated first metatarsal hypertrophic medial condyle with an increase in the first metatarsal angle greater than normal value. Joint space narrowing to the lateral aspect of the joint with lateral spurring. Since consistent with early osteoarthritic changes and early hallux limitus.  Gait: Unassisted, Nonantalgic.    Radiographs:  3  views left foot demonstrates joint space narrowing subcortical sclerosis hypertrophic medial condyle left foot. No fractures were identified.  Assessment & Plan:   Assessment: Hallux limitus first metatarsophalangeal joint left.  Plan: We discussed the etiology pathology conservative versus surgical therapies. At this point I injected the first metatarsophalangeal joint with Kenalog and local anesthetic. This should alleviate her symptoms for a while until we can get around to doing surgery. We consented her today for surgery consisting a Candie EchevariaOsten Youngs with osteotomy and double screw fixation. She understands this and is amenable to it. We did discuss the possible postop complications which may include but are not limited to postop pain bleeding swelling infection recurrence and need for further surgery. We will likely do this as soon as possible and she will need at least 3 weeks out of work. We dispensed a cam walker she was given both on an ongoing instruction for preop surgery Center as well as anesthesia.     Max T. Morro BayHyatt, [place] Dakota[place]PM

## 2017-03-13 NOTE — Telephone Encounter (Signed)
"  I just saw Dr. Al CorpusHyatt and he told me to call you to schedule my surgery."  I don't know what you are having.  I can schedule you for December 21.  "Put me down for that date and I will give you a call if there's a problem.  I may need to schedule it for January.  Is there anything else I need to do other than the information they gave me?"  You need to register with the surgical center.  Someone from the surgical center will call you with the arrival time a day or two prior to surgery date.

## 2017-03-13 NOTE — Patient Instructions (Signed)
Pre-Operative Instructions  Congratulations, you have decided to take an important step towards improving your quality of life.  You can be assured that the doctors and staff at [facility] will be with you every step of the way.  Here are some important things you should know:  1. Plan to be at the surgery center/hospital at least 1 (one) hour prior to your scheduled time, unless otherwise directed by the surgical center/hospital staff.  You must have a responsible adult accompany you, remain during the surgery and drive you home.  Make sure you have directions to the surgical center/hospital to ensure you arrive on time. 2. If you are having surgery at [facility] or [facility], you will need a copy of your medical history and physical form from your family physician within one month prior to the date of surgery. We will give you a form for your primary physician to complete.  3. We make every effort to accommodate the date you request for surgery.  However, there are times where surgery dates or times have to be moved.  We will contact you as soon as possible if a change in schedule is required.   4. No aspirin/ibuprofen for one week before surgery.  If you are on aspirin, any non-steroidal anti-inflammatory medications (Mobic, Aleve, Ibuprofen) should not be taken seven (7) days prior to your surgery.  You make take Tylenol for pain prior to surgery.  5. Medications - If you are taking daily heart and blood pressure medications, seizure, reflux, allergy, asthma, anxiety, pain or diabetes medications, make sure you notify the surgery center/hospital before the day of surgery so they can tell you which medications you should take or avoid the day of surgery. 6. No food or drink after midnight the night before surgery unless directed otherwise by surgical center/hospital staff. 7. No alcoholic beverages 24-hours prior to surgery.  No smoking 24-hours prior or 24-hours after  surgery. 8. Wear loose pants or shorts. They should be loose enough to fit over bandages, boots, and casts. 9. Don't wear slip-on shoes. Sneakers are preferred. 10. Bring your boot with you to the surgery center/hospital.  Also bring crutches or a walker if your physician has prescribed it for you.  If you do not have this equipment, it will be provided for you after surgery. 11. If you have not been contacted by the surgery center/hospital by the day before your surgery, call to confirm the date and time of your surgery. 12. Leave-time from work may vary depending on the type of surgery you have.  Appropriate arrangements should be made prior to surgery with your employer. 13. Prescriptions will be provided immediately following surgery by your doctor.  Fill these as soon as possible after surgery and take the medication as directed. Pain medications will not be refilled on weekends and must be approved by the doctor. 14. Remove nail polish on the operative foot and avoid getting pedicures prior to surgery. 15. Wash the night before surgery.  The night before surgery wash the foot and leg well with water and the antibacterial soap provided. Be sure to pay special attention to beneath the toenails and in between the toes.  Wash for at least three (3) minutes. Rinse thoroughly with water and dry well with a towel.  Perform this wash unless told not to do so by your physician.  Enclosed: 1 Ice pack (please put in freezer the night before surgery)   1 Hibiclens skin cleaner     Pre-op instructions  If you have any questions regarding the instructions, please do not hesitate to call our office.  [place]: [address], [place], [place] [address] -- [phone]  [place]: [address] [place], [place] [address] -- [phone]  [place]: [address]  [place], [place] [address] -- [phone]  [place]: [address], [address], [place], [place] [address] -- [phone]  Website:  [email or web address] 

## 2017-03-14 ENCOUNTER — Telehealth: Payer: Self-pay | Admitting: *Deleted

## 2017-03-14 NOTE — Telephone Encounter (Signed)
"  I talked to you yesterday and scheduled surgery for December 21.  My husband don't want me to do it around Christmas.  So, does he have anything around January 11 or after?  Please give me a call back.  I just need to turn it in with my work as soon as possible."

## 2017-03-15 NOTE — Telephone Encounter (Signed)
"  I'm calling to reschedule my surgery date until after Christmas.  My husband wanted me to wait because we have a lot of grandkids that we will be spending time with."  He can do it on January 4.  "That will be fine."  I left Aram BeechamCynthia, surgery scheduler, a message to reschedule patient's surgery date from May 05, 2017 to May 19, 2017.

## 2017-03-28 ENCOUNTER — Telehealth: Payer: Self-pay | Admitting: *Deleted

## 2017-03-28 NOTE — Telephone Encounter (Signed)
I am returning your call.  I rescheduled your surgery from January 4 to December 21.  "Okay, thank you so much.  I hate to be a bother to you."  You're no bother at all.

## 2017-03-28 NOTE — Telephone Encounter (Signed)
"  Good Morning Tami Williams, I have surgery scheduled for January 4.  We moved it from December 21.  I don't mean to bother you but I just want to find out if you still have that December 21 date available.  I found out my deductible is already met for this year.  If I wait to January I will have to meet another deductible.  I really apologize for this.  Give me a call back.  Let me know as soon as you can so I can change the date on my FMLA and let my job know."

## 2017-04-13 ENCOUNTER — Telehealth: Payer: Self-pay | Admitting: *Deleted

## 2017-04-13 NOTE — Telephone Encounter (Signed)
"  I am scheduled to have surgery.  I want to know if my fake french manicure nails will have to be removed prior to having surgery."  I asked Dr. Samuella CotaPrice and he said yes, it's recommended that the nails be remove to reduce chances of getting infection.  "Okay, thank you that is all I needed to know."

## 2017-04-26 ENCOUNTER — Telehealth: Payer: Self-pay | Admitting: Podiatry

## 2017-04-26 NOTE — Telephone Encounter (Signed)
I just have some questions following my surgery next Friday 21 December. If you could please call me back at [phone]. Thanks.

## 2017-04-27 NOTE — Telephone Encounter (Signed)
Patient notified and questions were answered

## 2017-05-03 ENCOUNTER — Other Ambulatory Visit: Payer: Self-pay | Admitting: Podiatry

## 2017-05-03 MED ORDER — CEPHALEXIN 500 MG PO CAPS: 500.0000 mg | ORAL_CAPSULE | Freq: Three times a day (TID) | ORAL | 0 refills | Status: DC

## 2017-05-03 MED ORDER — OXYCODONE-ACETAMINOPHEN 10-325 MG PO TABS: 1.0000 | ORAL_TABLET | ORAL | 0 refills | Status: DC | PRN

## 2017-05-03 MED ORDER — PROMETHAZINE HCL 25 MG PO TABS: 25.0000 mg | ORAL_TABLET | Freq: Three times a day (TID) | ORAL | 0 refills | Status: DC | PRN

## 2017-05-05 ENCOUNTER — Encounter: Payer: Self-pay | Admitting: Podiatry

## 2017-05-05 DIAGNOSIS — M2012 Hallux valgus (acquired), left foot: Secondary | ICD-10-CM | POA: Diagnosis not present

## 2017-05-11 ENCOUNTER — Ambulatory Visit: Payer: BC Managed Care – PPO

## 2017-05-11 DIAGNOSIS — M2012 Hallux valgus (acquired), left foot: Secondary | ICD-10-CM

## 2017-05-12 ENCOUNTER — Ambulatory Visit (INDEPENDENT_AMBULATORY_CARE_PROVIDER_SITE_OTHER): Payer: BC Managed Care – PPO | Admitting: Podiatry

## 2017-05-12 ENCOUNTER — Encounter: Payer: Self-pay | Admitting: Podiatry

## 2017-05-12 ENCOUNTER — Ambulatory Visit (INDEPENDENT_AMBULATORY_CARE_PROVIDER_SITE_OTHER): Payer: BC Managed Care – PPO

## 2017-05-12 VITALS — BP 135/92 | HR 86 | Temp 98.3°F | Resp 16

## 2017-05-12 DIAGNOSIS — Z9889 Other specified postprocedural states: Secondary | ICD-10-CM

## 2017-05-12 DIAGNOSIS — M2012 Hallux valgus (acquired), left foot: Secondary | ICD-10-CM

## 2017-05-12 MED ORDER — OXYCODONE-ACETAMINOPHEN 10-325 MG PO TABS: 1.0000 | ORAL_TABLET | ORAL | 0 refills | Status: DC | PRN

## 2017-05-17 ENCOUNTER — Encounter: Payer: Self-pay | Admitting: Podiatry

## 2017-05-17 ENCOUNTER — Ambulatory Visit (INDEPENDENT_AMBULATORY_CARE_PROVIDER_SITE_OTHER): Payer: BC Managed Care – PPO | Admitting: Podiatry

## 2017-05-17 DIAGNOSIS — M2012 Hallux valgus (acquired), left foot: Secondary | ICD-10-CM

## 2017-05-17 DIAGNOSIS — Z9889 Other specified postprocedural states: Secondary | ICD-10-CM

## 2017-05-17 NOTE — Progress Notes (Signed)
She presents today 2 weeks status post [facility].  She states that is doing pretty good I am only been taking Tylenol extra strength.  Objective: Vital signs are stable she is alert and oriented x3 great range of motion of the first metatarsophalangeal joint with moderate edema.  Sutures are intact margins are well coapted even though we removed the suture ends.  Assessment: Well-healing surgical foot left.  Plan: I will allow her to get back to work as long as she can sit with an elevated.  Placed on a compression anklet in a Darco shoe.  I will follow-up with her in 2 weeks

## 2017-05-18 NOTE — Progress Notes (Signed)
   Subjective:  Patient presents today status post left bunionectomy. DOS: 05/05/17.  She states she is doing well overall.  She reports a stinging sensation in the foot and toes, but states the pain is tolerable.  She currently rates the pain at 2/10.  She denies fever or chills.  Patient is here for further evaluation and treatment.   Past Medical History:  Diagnosis Date  . Acute epigastric pain   . GAD (generalized anxiety disorder)   . GERD (gastroesophageal reflux disease)   . Hypertension   . Non-cardiac chest pain       Objective/Physical Exam Skin incisions appear to be well coapted with sutures and staples intact. No sign of infectious process noted. No dehiscence. No active bleeding noted. Moderate edema noted to the surgical extremity.  Radiographic Exam:  Orthopedic hardware and osteotomies sites appear to be stable with routine healing.  Assessment: 1. s/p Left bunionectomy. DOS: 05/05/17   Plan of Care:  1. Patient was evaluated. X-rays reviewed 2.  Dressing change. 3.  Continue oral antibiotics as per Dr. Al CorpusHyatt. 4.  Continue weightbearing in cam boot. 5.  Return to clinic in 1 week.   Felecia ShellingBrent M. Mabrey Howland, DPM [facility]  Dr. Felecia ShellingBrent M. Javoni Lucken, DPM    [address]                                        [place], [place] [address]                Office [phone]  Fax [phone](336) [phone]

## 2017-05-22 NOTE — Progress Notes (Signed)
canceled

## 2017-05-25 ENCOUNTER — Ambulatory Visit: Payer: BC Managed Care – PPO | Admitting: Family Medicine

## 2017-05-25 ENCOUNTER — Telehealth: Payer: Self-pay | Admitting: Podiatry

## 2017-05-25 ENCOUNTER — Telehealth: Payer: Self-pay | Admitting: *Deleted

## 2017-05-25 ENCOUNTER — Encounter: Payer: Self-pay | Admitting: Family Medicine

## 2017-05-25 VITALS — BP 122/98 | HR 61 | Temp 97.9°F | Resp 15 | Wt 132.0 lb

## 2017-05-25 DIAGNOSIS — J0100 Acute maxillary sinusitis, unspecified: Secondary | ICD-10-CM | POA: Diagnosis not present

## 2017-05-25 MED ORDER — AMOXICILLIN-POT CLAVULANATE 875-125 MG PO TABS
1.0000 | ORAL_TABLET | Freq: Two times a day (BID) | ORAL | 0 refills | Status: DC
Start: 2017-05-25 — End: 2017-07-24

## 2017-05-25 NOTE — Progress Notes (Signed)
Subjective:     Patient ID: Tami CopierLisa P Williams, female   DOB: 10-26-1965, 52 y.o.   MRN: [id number] Chief Complaint  Patient presents with  . Sinus Problem    Patient comes in office today with complaints of sinus pain and pressure for the past 7 days. Pastient states that he has had pressure in her ears, decreased hearing, sinus pressure below her eyes and coughing yellow phlegm. Patient has tried otc Coricidan Hbp, Architectlonase and Clarirtin.    HPI Reports onset of cold sx approx. 2 weeks ago. Patient reports increased sinus pressure, purulent sinus drainage, post nasal drainage and accompanying cough Review of Systems     Objective:   Physical Exam  Constitutional: She appears well-developed and well-nourished. No distress.  Ears: T.M's intact without inflammation Sinuses:mild maxillary sinus tenderness Throat: no tonsillar enlargement or exudate Neck: no cervical adenopathy Lungs: clear     Assessment:    1. Acute non-recurrent maxillary sinusitis - amoxicillin-clavulanate (AUGMENTIN) 875-125 MG tablet; Take 1 tablet by mouth 2 (two) times daily.  Dispense: 20 tablet; Refill: 0    Plan:    Discussed use of Mucinex and Sudafed PE.

## 2017-05-25 NOTE — Telephone Encounter (Signed)
I had surgery with Dr. Al CorpusHyatt on 05 May 2017. I left a message earlier but I'm calling back because I have a mild pain underneath the arch of my foot. If somebody would please give me a call back as I want to make sure this is normal and I'm not needing to be seen. The number is [phone]. Thank you.

## 2017-05-25 NOTE — Patient Instructions (Signed)
Add Mucinex and Sudafed PE.

## 2017-05-25 NOTE — Telephone Encounter (Signed)
Pt states she had surgery with Dr. Al CorpusHyatt 05/05/2017 and is now having an achiness in a different area on the surgery foot, than surgery site. I left message informing pt that at this time in her surgery recovery she was probably adding more activities to her day and that could cause some discomfort, to back off on her activities, go back into the surgery shoe, rest, ice and elevate as needed for comfort and swelling. To call the [facility] [phone] for evaluation or information.

## 2017-05-26 NOTE — Telephone Encounter (Signed)
I called patient to follow up with her and left message letting her know that I was checking on her and if she continues to have pain, she can call office and schedule appt.

## 2017-05-31 ENCOUNTER — Encounter: Payer: Self-pay | Admitting: Podiatry

## 2017-05-31 ENCOUNTER — Ambulatory Visit (INDEPENDENT_AMBULATORY_CARE_PROVIDER_SITE_OTHER): Payer: BC Managed Care – PPO

## 2017-05-31 ENCOUNTER — Encounter: Payer: BC Managed Care – PPO | Admitting: Podiatry

## 2017-05-31 ENCOUNTER — Ambulatory Visit (INDEPENDENT_AMBULATORY_CARE_PROVIDER_SITE_OTHER): Payer: BC Managed Care – PPO | Admitting: Podiatry

## 2017-05-31 DIAGNOSIS — Z9889 Other specified postprocedural states: Secondary | ICD-10-CM

## 2017-05-31 DIAGNOSIS — M2012 Hallux valgus (acquired), left foot: Secondary | ICD-10-CM

## 2017-05-31 NOTE — Progress Notes (Signed)
She presents today nearly 1 month status post [facility].  She states that is doing absolutely great is very happy with the outcome thus far she states that is really not even hurting anymore.  Objective: Vital signs are stable she is alert and oriented x3 there is no erythema just some mild edema no cellulitis drainage or odor.  Incision site is gone on to heal uneventfully.  She has full range of motion of dorsiflexion and plantarflexion.  Radiographs taken today 3 views of the left foot in the office demonstrate an osseously mature individual first metatarsal osteotomy internal fixation is intact capital fragment appears to be healing nicely.  Assessment: Well-healing surgical foot left.  Plan: We will allow her back into regular shoe gear such as tennis shoes or she will continue the use of her Darco shoe.  I provided her with oral instructions with do's and don'ts in the care of this surgical foot.  I will follow-up with her in 1 month for another set of x-rays.

## 2017-06-08 ENCOUNTER — Ambulatory Visit (INDEPENDENT_AMBULATORY_CARE_PROVIDER_SITE_OTHER): Payer: BC Managed Care – PPO | Admitting: Podiatry

## 2017-06-08 ENCOUNTER — Encounter: Payer: Self-pay | Admitting: Podiatry

## 2017-06-08 DIAGNOSIS — M2012 Hallux valgus (acquired), left foot: Secondary | ICD-10-CM

## 2017-06-08 NOTE — Progress Notes (Signed)
She presents today for a concern about redness and swelling to the dorsal aspect of her left foot.  Date of surgery was 05/05/2017 I just saw her last week on 16 January.  She states that the foot become red and swollen after she stopped taking anti-inflammatories.  She states that the compression anklet really aggravates the incision line and is painful for her.  She has tried on a pair of shoes but stated that they were too tight.  Objective: Vital signs are stable alert and oriented x3.  Pulses are palpable.  Erythema is diminished considerably from the picture she demonstrated that was taken yesterday.  This was right over the incision line and appears to be more of an inflammatory reaction associated with most likely the suture which is dissolvable.  Assessment: I see no signs of infection I think that this is simply a inflammatory response.  Plan: Instructed her to continue on light doses of ibuprofen and try to get into regular shoe gear over the next few weeks.  I encouraged her to discontinue the use of the compression anklet due to its irritation in the area.

## 2017-06-09 ENCOUNTER — Ambulatory Visit: Payer: BC Managed Care – PPO | Admitting: Podiatry

## 2017-06-28 ENCOUNTER — Encounter: Payer: BC Managed Care – PPO | Admitting: Podiatry

## 2017-06-30 ENCOUNTER — Encounter: Payer: BC Managed Care – PPO | Admitting: Podiatry

## 2017-07-04 ENCOUNTER — Ambulatory Visit (INDEPENDENT_AMBULATORY_CARE_PROVIDER_SITE_OTHER): Payer: BC Managed Care – PPO | Admitting: Podiatry

## 2017-07-04 ENCOUNTER — Encounter: Payer: Self-pay | Admitting: Podiatry

## 2017-07-04 ENCOUNTER — Ambulatory Visit (INDEPENDENT_AMBULATORY_CARE_PROVIDER_SITE_OTHER): Payer: BC Managed Care – PPO

## 2017-07-04 DIAGNOSIS — M2012 Hallux valgus (acquired), left foot: Secondary | ICD-10-CM

## 2017-07-04 DIAGNOSIS — Z9889 Other specified postprocedural states: Secondary | ICD-10-CM

## 2017-07-06 NOTE — Progress Notes (Signed)
   Subjective:  Patient presents today status post left bunionectomy. DOS: 05/05/17. She reports continued pain of the left foot. She reports associated swelling. Walking and standing barefoot increases the symptoms. She has been taking Ibuprofen and Tylenol with some relief.  Patient is here for further evaluation and treatment.   Past Medical History:  Diagnosis Date  . Acute epigastric pain   . GAD (generalized anxiety disorder)   . GERD (gastroesophageal reflux disease)   . Hypertension   . Non-cardiac chest pain       Objective/Physical Exam Skin incisions appear to be well coapted. No sign of infectious process noted. No dehiscence. No active bleeding noted. Moderate edema noted to the surgical extremity.  Radiographic Exam:  Orthopedic hardware and osteotomies sites appear to be stable with routine healing.  Assessment: 1. s/p Left bunionectomy. DOS: 05/05/17   Plan of Care:  1. Patient was evaluated. X-rays reviewed 2. May resume full activity with no restrictions.  3. Recommended good shoe gear.  4. Return to clinic in 3 months.    Felecia ShellingBrent M. Evans, DPM [facility]  Dr. Felecia ShellingBrent M. Evans, DPM    [address]                                        [place], [place] [address]                Office [phone]) [phone]  Fax [phone](336) [phone]

## 2017-07-18 IMAGING — US US ABDOMEN COMPLETE
1 series · 14 of 25 positions shown · non-contrast
Comparison: None.

CLINICAL DATA: Gastroesophageal reflux disease, esophagitis
presence not specified 0W9.S (QHL-0G-CM)
Epigastric abdominal pain LVF.VK (QHL-0G-CM)
Non-cardiac chest pain IUM.H7 (QHL-0G-CM)

EXAM:
ABDOMEN ULTRASOUND COMPLETE

[Series 1: us abdomen complete · 0.17mm/px · 14 of 91 slices shown]
[im 1/91]
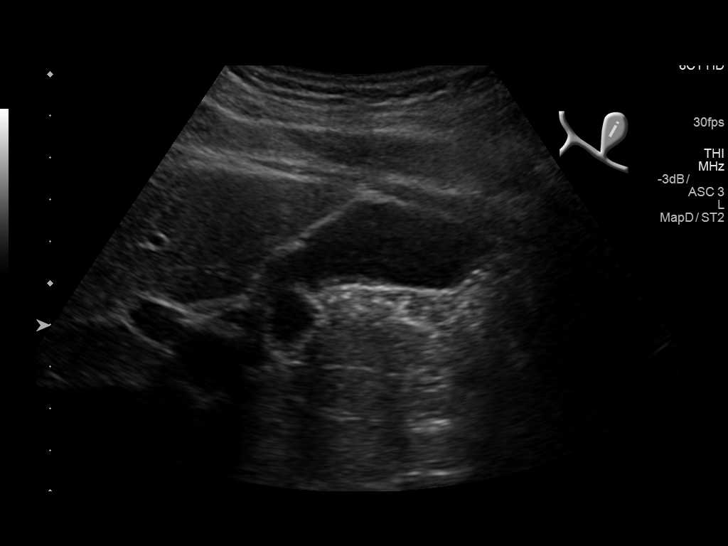
[im 8/91]
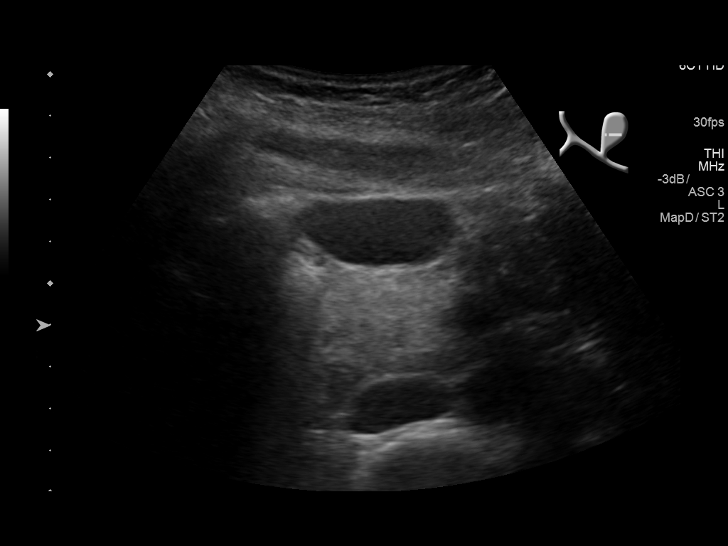
[im 16/91]
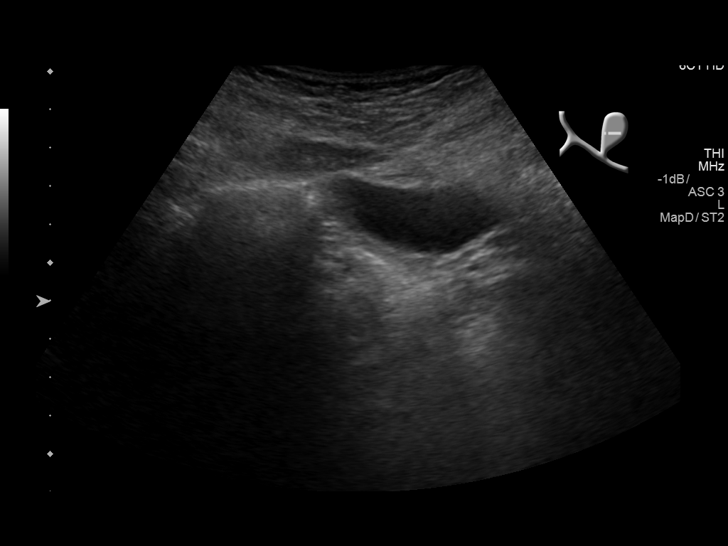
[im 23/91]
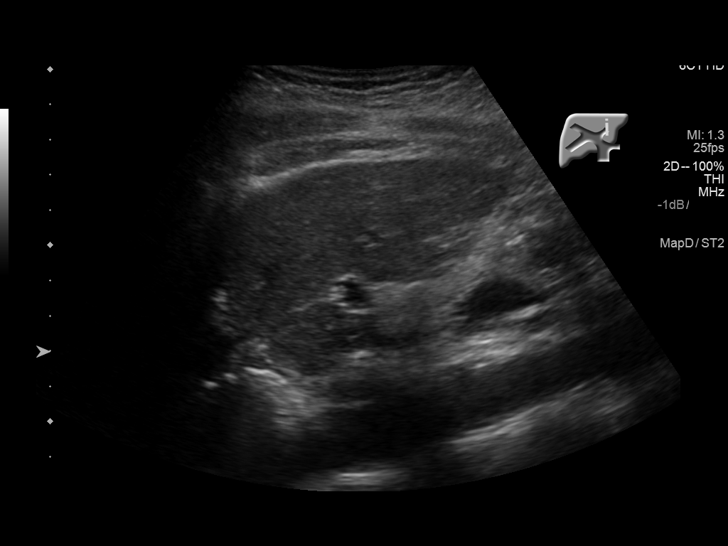
[im 31/91]
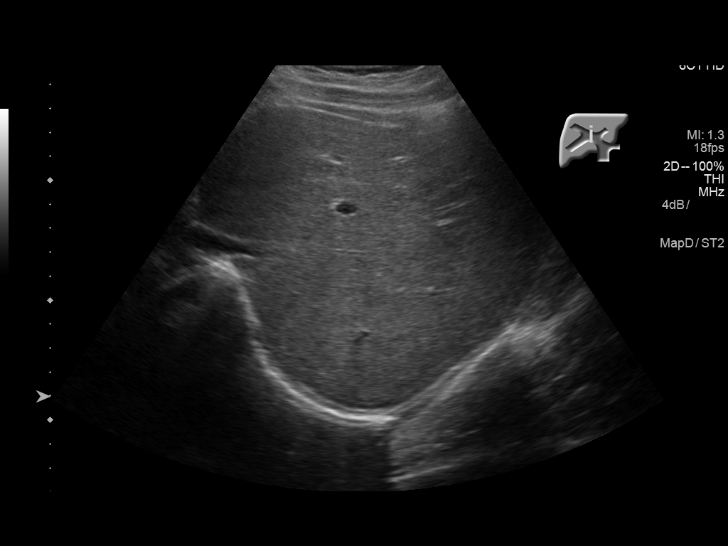
[im 34/91]
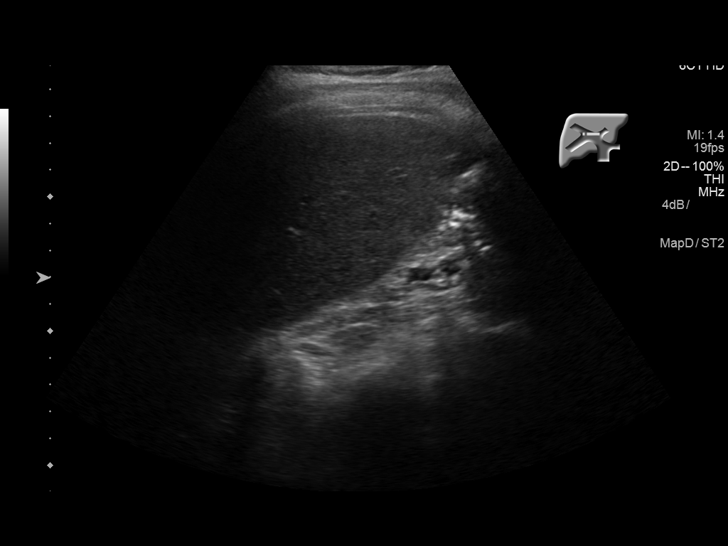
[im 42/91]
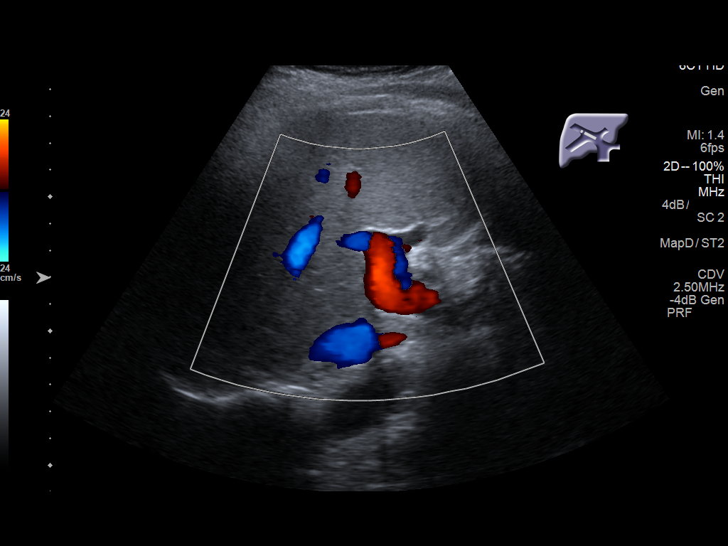
[im 49/91]
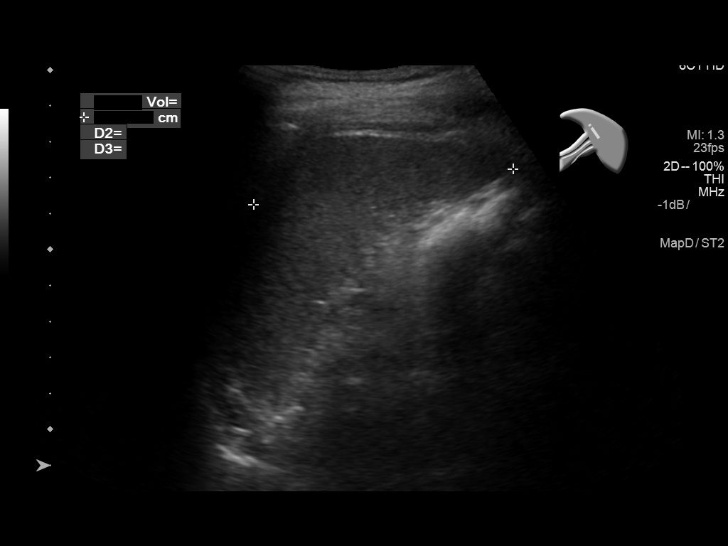
[im 57/91]
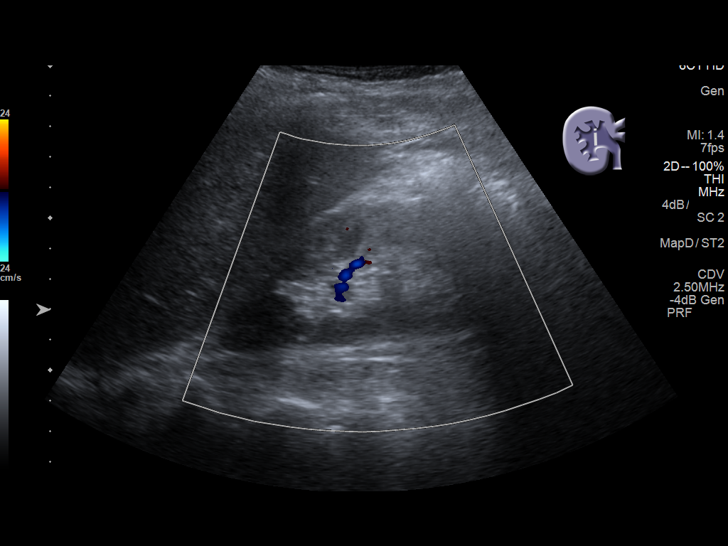
[im 61/91]
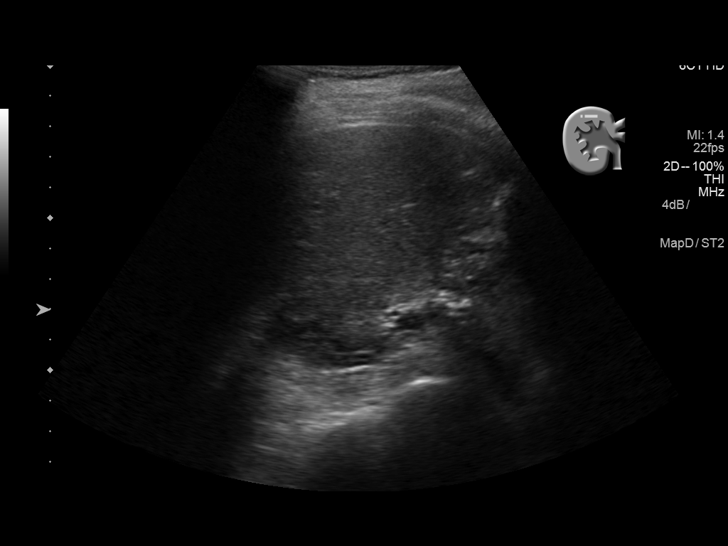
[im 68/91]
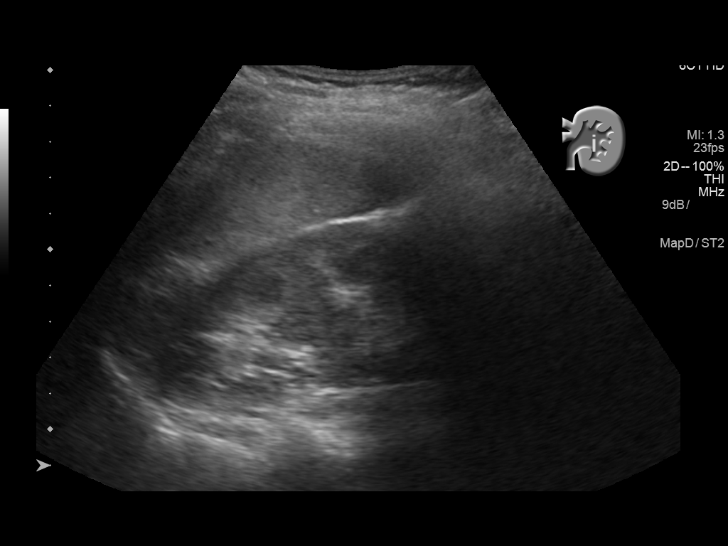
[im 76/91]
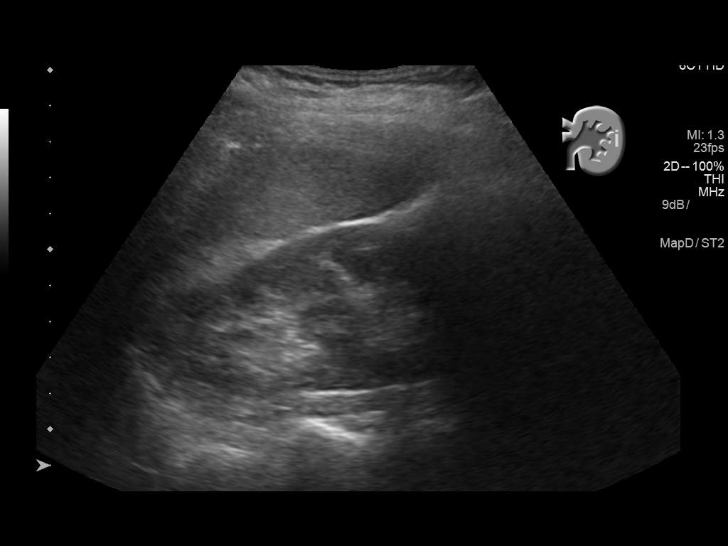
[im 83/91]
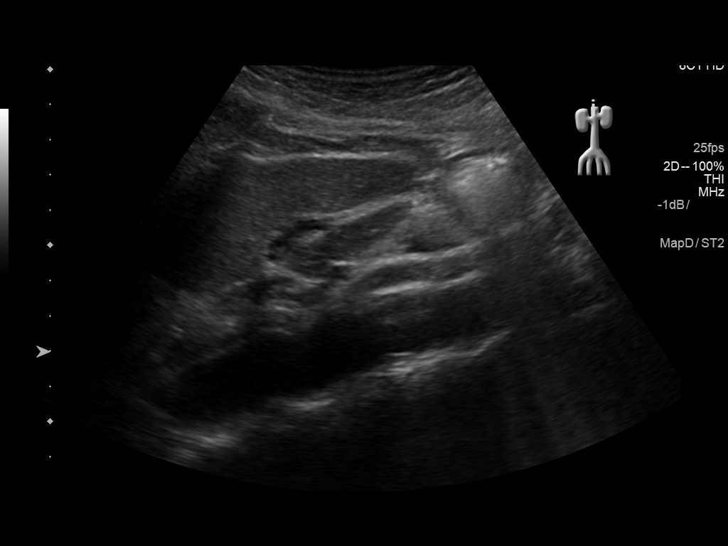
[im 91/91]
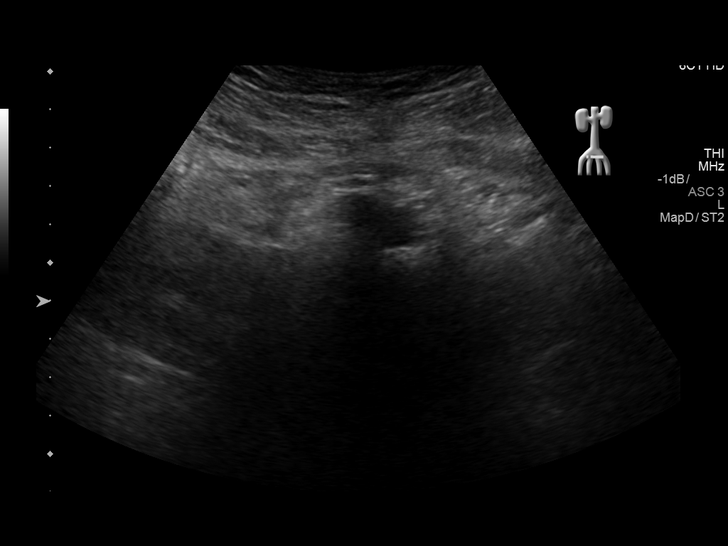

[14 of 25 positions shown; findings below may reference images not displayed]

FINDINGS: Gallbladder: No gallstones or wall thickening visualized. No
sonographic Murphy sign noted by sonographer.

Common bile duct: Diameter: 2.6 mm

Liver: No focal lesion identified. Within normal limits in
parenchymal echogenicity.

IVC: No abnormality visualized.

Pancreas: Visualized portion unremarkable.

Spleen: Size and appearance within normal limits.

Right Kidney: Length: 10.3 cm. Echogenicity within normal limits. No
mass or hydronephrosis visualized.

Left Kidney: Length: 9.5 cm. Echogenicity within normal limits. No
mass or hydronephrosis visualized.

Abdominal aorta: No aneurysm visualized.

Other findings: None.
IMPRESSION: Normal abdominal ultrasound.

## 2017-07-24 ENCOUNTER — Encounter: Payer: Self-pay | Admitting: Family Medicine

## 2017-07-24 ENCOUNTER — Ambulatory Visit: Payer: BC Managed Care – PPO | Admitting: Family Medicine

## 2017-07-24 VITALS — BP 116/80 | HR 78 | Temp 99.7°F | Resp 16 | Wt 141.0 lb

## 2017-07-24 DIAGNOSIS — J101 Influenza due to other identified influenza virus with other respiratory manifestations: Secondary | ICD-10-CM

## 2017-07-24 DIAGNOSIS — R509 Fever, unspecified: Secondary | ICD-10-CM | POA: Diagnosis not present

## 2017-07-24 LAB — POCT INFLUENZA A/B
INFLUENZA B, POC: NEGATIVE
Influenza A, POC: POSITIVE — AB

## 2017-07-24 MED ORDER — HYDROCODONE-HOMATROPINE 5-1.5 MG/5ML PO SYRP: ORAL_SOLUTION | ORAL | 0 refills | Status: DC

## 2017-07-24 NOTE — Patient Instructions (Signed)
Discussed use of Mucinex D for congestion and Delsym for cough. 

## 2017-07-24 NOTE — Progress Notes (Signed)
Subjective:     Patient ID: Toma CopierLisa P Laird, female   DOB: Feb 06, 1966, 52 y.o.   MRN: [id number] Chief Complaint  Patient presents with  . URI    Patient comes in office today with complaints of cold like symptoms that began two days ago. Patient reports sore throat, cough, body aches, headache, back ache and dry heaving. Patient states that she has been taking otc Robitussin, patient states that grandchildren had flu 7 days ago/    HPI Reports chills but did not take temp. At home. + flu shot.  Review of Systems     Objective:   Physical Exam  Constitutional: She appears well-developed and well-nourished. No distress.  Ears: Right TM dull. Left TM intact without inflammation Throat: no tonsillar enlargement or exudate Neck: no cervical adenopathy Lungs: clear     Assessment:    1. Fever, unspecified fever cause - POCT Influenza A/B  2. Influenza A - HYDROcodone-homatropine (HYCODAN) 5-1.5 MG/5ML syrup; 5 ml 4-6 hours as needed for cough  Dispense: 120 mL; Refill: 0    Plan:    Discussed use of Mucinex D and Delsym. Work excuse for 3/11-3/19.

## 2017-07-25 ENCOUNTER — Encounter: Payer: Self-pay | Admitting: Family Medicine

## 2017-07-25 ENCOUNTER — Telehealth: Payer: Self-pay | Admitting: Physician Assistant

## 2017-07-25 NOTE — Telephone Encounter (Signed)
Please review, okay to clear to normal activities? KW

## 2017-07-25 NOTE — Telephone Encounter (Signed)
Advised patient and she states she has not had fever in the past 48hrs and  Would like to return back to work 07/26/17. I informed patient that it would be okay to return back to activities, patient wanted me to send a email to her personal address stating that she was clear to return back to work. I informed her that we do not send personal information to email address not secured, I advised patient that if she prefers email she can send a request thru her MyChart and I can document in response to email that she is clear to return. KW

## 2017-07-25 NOTE — Telephone Encounter (Signed)
Ok if she has not had a fever for 48 hours. You may write her a return to work note if required.

## 2017-07-25 NOTE — Telephone Encounter (Signed)
Patient states that you wrote her out of work until Monday 07/31/2017 and told her if she was feeling better that you would let her go back to work.  She states that she is feeling better and has no fever and would like to know if you will let her go back to work tomorrow.

## 2017-08-08 ENCOUNTER — Other Ambulatory Visit: Payer: Self-pay | Admitting: Physician Assistant

## 2017-08-08 DIAGNOSIS — Z1231 Encounter for screening mammogram for malignant neoplasm of breast: Secondary | ICD-10-CM

## 2017-08-20 ENCOUNTER — Other Ambulatory Visit: Payer: Self-pay | Admitting: Physician Assistant

## 2017-08-20 DIAGNOSIS — F321 Major depressive disorder, single episode, moderate: Secondary | ICD-10-CM

## 2017-08-20 DIAGNOSIS — F411 Generalized anxiety disorder: Secondary | ICD-10-CM

## 2017-08-30 ENCOUNTER — Ambulatory Visit
Admission: RE | Admit: 2017-08-30 | Discharge: 2017-08-30 | Disposition: A | Discharge status: Home / Self Care | Payer: BC Managed Care – PPO | Source: Ambulatory Visit | Attending: Physician Assistant | Admitting: Physician Assistant

## 2017-08-30 ENCOUNTER — Other Ambulatory Visit: Payer: Self-pay | Admitting: Physician Assistant

## 2017-08-30 DIAGNOSIS — I10 Essential (primary) hypertension: Secondary | ICD-10-CM

## 2017-08-30 DIAGNOSIS — Z1231 Encounter for screening mammogram for malignant neoplasm of breast: Secondary | ICD-10-CM | POA: Diagnosis present

## 2017-09-15 ENCOUNTER — Encounter: Payer: BC Managed Care – PPO | Admitting: Physician Assistant

## 2017-10-04 ENCOUNTER — Ambulatory Visit: Payer: BC Managed Care – PPO | Admitting: Podiatry

## 2017-10-11 ENCOUNTER — Encounter: Payer: BC Managed Care – PPO | Admitting: Physician Assistant

## 2017-11-08 ENCOUNTER — Ambulatory Visit: Payer: BC Managed Care – PPO | Admitting: Podiatry

## 2017-11-20 ENCOUNTER — Encounter: Payer: Self-pay | Admitting: Physician Assistant

## 2017-11-20 NOTE — Progress Notes (Deleted)
Patient: Tami Williams, Female    DOB: Jul 04, 1965, 52 y.o.   MRN: [id number] Visit Date: 11/20/2017  Today's Provider: Margaretann Loveless, PA-C   No chief complaint on file.  Subjective:    Annual physical exam Tami Williams is a 52 y.o. female who presents today for health maintenance and complete physical. She feels {DESC; WELL/FAIRLY WELL/POORLY:18703}. She reports exercising ***. She reports she is sleeping {DESC; WELL/FAIRLY WELL/POORLY:18703}.  Last CPE:09/12/16 Pap:09/11/12 Normal,HPV-Negative Mammogram:08/30/17 BI-RADS 1  -----------------------------------------------------------------   Review of Systems  Constitutional: Negative.   HENT: Negative.   Eyes: Negative.   Respiratory: Negative.   Cardiovascular: Negative.   Gastrointestinal: Negative.   Endocrine: Negative.   Genitourinary: Negative.   Musculoskeletal: Negative.   Skin: Negative.   Allergic/Immunologic: Negative.   Neurological: Negative.   Hematological: Negative.   Psychiatric/Behavioral: Negative.     Social History      She  reports that she has never smoked. She has never used smokeless tobacco. She reports that she drinks alcohol. She reports that she does not use drugs.       Social History   Socioeconomic History  . Marital status: Married    Spouse name: Michele Judy  . Number of children: 3  . Years of education: GED  . Highest education level: Not on file  Occupational History    Employer: [employer]  Social Needs  . Financial resource strain: Not on file  . Food insecurity:    Worry: Not on file    Inability: Not on file  . Transportation needs:    Medical: Not on file    Non-medical: Not on file  Tobacco Use  . Smoking status: Never Smoker  . Smokeless tobacco: Never Used  Substance and Sexual Activity  . Alcohol use: Yes    Alcohol/week: 0.0 oz    Comment: occasional  . Drug use: No  . Sexual activity: Yes  Lifestyle  . Physical activity:    Days per week:  Not on file    Minutes per session: Not on file  . Stress: Not on file  Relationships  . Social connections:    Talks on phone: Not on file    Gets together: Not on file    Attends religious service: Not on file    Active member of club or organization: Not on file    Attends meetings of clubs or organizations: Not on file    Relationship status: Not on file  Other Topics Concern  . Not on file  Social History Narrative  . Not on file    Past Medical History:  Diagnosis Date  . Acute epigastric pain   . GAD (generalized anxiety disorder)   . GERD (gastroesophageal reflux disease)   . Hypertension   . Non-cardiac chest pain      Patient Active Problem List   Diagnosis Date Noted  . Biliary dyskinesia 12/07/2016  . Perimenopausal symptoms 09/13/2016  . Allergic rhinitis 03/20/2015  . Cold sore 03/20/2015  . Abnormal liver enzymes 03/20/2015  . 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 Hypertension 03/20/2015  . Diaphragmatic hernia 03/20/2015  . 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 Anxiety disorder 03/20/2015  . Acid reflux 09/23/2007  . Gastro-esophageal reflux disease without esophagitis 09/23/2007    Past Surgical History:  Procedure Laterality Date  . 24 HOUR PH STUDY N/A 01/06/2016   Procedure: 24 HOUR PH STUDY;  Surgeon: Midge Minium, MD;  Location:  [facility] ENDOSCOPY;  Service: Endoscopy;  Laterality: N/A;  . ABDOMINAL HYSTERECTOMY     due to endometriosis  . ESOPHAGEAL MANOMETRY N/A 01/06/2016   Procedure: ESOPHAGEAL MANOMETRY (EM);  Surgeon: Midge Minium, MD;  Location: [facility] ENDOSCOPY;  Service: Endoscopy;  Laterality: N/A;  . ESOPHAGOGASTRODUODENOSCOPY    . ESOPHAGOGASTRODUODENOSCOPY (EGD) WITH PROPOFOL N/A 12/02/2015   Procedure: ESOPHAGOGASTRODUODENOSCOPY (EGD) WITH PROPOFOL;  Surgeon: Scot Jun, MD;  Location: [facility] ENDOSCOPY;  Service: Endoscopy;  Laterality: N/A;  . NISSEN FUNDOPLICATION  2007   dr. Katrinka Blazing     Family History        Family Status  Relation Name Status  . Mother  Alive  . Father  Alive  . Brother half brother Alive  . Brother half brother Alive        Her family history includes Diabetes in her mother; Healthy in her brother and brother; Heart disease in her mother; Heart failure in her mother; Hypertension in her father and mother.      Allergies  Allergen Reactions  . No Known Allergies      Current Outpatient Medications:  .  acetaminophen (TYLENOL) 500 MG tablet, Take 500 mg by mouth every 6 (six) hours as needed., Disp: , Rfl:  .  cyclobenzaprine (FLEXERIL) 10 MG tablet, TAKE 1 TABLET(10 MG) BY MOUTH THREE TIMES DAILY AS NEEDED FOR MUSCLE SPASMS (Patient not taking: Reported on 07/24/2017), Disp: 30 tablet, Rfl: 0 .  fluticasone (FLONASE) 50 MCG/ACT nasal spray, Place 2 sprays into both nostrils as needed. , Disp: , Rfl:  .  HYDROcodone-homatropine (HYCODAN) 5-1.5 MG/5ML syrup, 5 ml 4-6 hours as needed for cough, Disp: 120 mL, Rfl: 0 .  lisinopril (PRINIVIL,ZESTRIL) 10 MG tablet, TAKE 1 TABLET(10 MG) BY MOUTH DAILY, Disp: 90 tablet, Rfl: 1 .  ondansetron (ZOFRAN-ODT) 4 MG disintegrating tablet, Take by mouth., Disp: , Rfl:  .  oxyCODONE-acetaminophen (PERCOCET) 10-325 MG tablet, Take 1 tablet by mouth every 4 (four) hours as needed for pain. (Patient not taking: Reported on 05/25/2017), Disp: 30 tablet, Rfl: 0 .  pantoprazole (PROTONIX) 40 MG tablet, TAKE 1 TABLET(40 MG) BY MOUTH TWICE DAILY AS NEEDED, Disp: 60 tablet, Rfl: 5 .  PARoxetine (PAXIL) 20 MG tablet, START WITH 0.5 TABLET FOR 1 WEEK THEN. INCREASE TO 1 TABLET DAILY, Disp: 90 tablet, Rfl: 1 .  promethazine (PHENERGAN) 25 MG tablet, Take 1 tablet (25 mg total) by mouth every 8 (eight) hours as needed. (Patient not taking: Reported on 05/25/2017), Disp: 20 tablet, Rfl: 0 .  SYNTHROID 50 MCG tablet, TK 1 T PO QD, Disp: , Rfl: 6   Patient Care Team: Margaretann Loveless, PA-C as PCP - General (Family Medicine)       Objective:   Vitals: There were no vitals taken for this visit.   Physical Exam  Constitutional: She is oriented to person, place, and time. She appears well-developed and well-nourished.  HENT:  Head: Normocephalic.  Right Ear: External ear normal.  Left Ear: External ear normal.  Nose: Nose normal.  Mouth/Throat: Oropharynx is clear and moist.  Eyes: Pupils are equal, round, and reactive to light. Conjunctivae and EOM are normal.  Neck: Normal range of motion. Neck supple.  Cardiovascular: Normal rate, regular rhythm, normal heart sounds and intact distal pulses.  Pulmonary/Chest: Effort normal and breath sounds normal.  Abdominal: Soft. Bowel sounds are normal.  Musculoskeletal: Normal range of motion.  Neurological: She is alert and oriented to person, place, and time.  Skin: Skin is  warm and dry.  Psychiatric: She has a normal mood and affect. Her behavior is normal. Judgment and thought content normal.     Depression Screen PHQ 2/9 Scores 02/22/2017 09/12/2016 09/09/2015  PHQ - 2 Score 3 0 0  PHQ- 9 Score 10 1 -      Assessment & Plan:     Routine Health Maintenance and Physical Exam  Exercise Activities and Dietary recommendations Goals    None      Immunization History  Administered Date(s) Administered  . Influenza,inj,Quad PF,6+ Mos 02/27/2016, 02/22/2017  . Tdap 02/22/2017    Health Maintenance  Topic Date Due  . HIV Screening  10/06/1982  . PAP SMEAR  09/12/2015  . COLONOSCOPY  10/05/2017  . INFLUENZA VACCINE  12/14/2017  . MAMMOGRAM  08/31/2019  . TETANUS/TDAP  02/23/2027     Discussed health benefits of physical activity, and encouraged her to engage in regular exercise appropriate for her age and condition.    --------------------------------------------------------------------    Margaretann LovelessJennifer M Burnette, PA-C  [facility]

## 2018-02-22 ENCOUNTER — Encounter: Payer: Self-pay | Admitting: Physician Assistant

## 2018-02-22 ENCOUNTER — Ambulatory Visit: Payer: BC Managed Care – PPO | Admitting: Physician Assistant

## 2018-02-22 VITALS — BP 140/88 | HR 68 | Temp 97.5°F | Resp 16 | Wt 131.0 lb

## 2018-02-22 DIAGNOSIS — R519 Headache, unspecified: Secondary | ICD-10-CM

## 2018-02-22 DIAGNOSIS — Z23 Encounter for immunization: Secondary | ICD-10-CM | POA: Diagnosis not present

## 2018-02-22 DIAGNOSIS — K449 Diaphragmatic hernia without obstruction or gangrene: Secondary | ICD-10-CM

## 2018-02-22 DIAGNOSIS — H5712 Ocular pain, left eye: Secondary | ICD-10-CM | POA: Diagnosis not present

## 2018-02-22 DIAGNOSIS — R51 Headache: Secondary | ICD-10-CM | POA: Diagnosis not present

## 2018-02-22 DIAGNOSIS — R3989 Other symptoms and signs involving the genitourinary system: Secondary | ICD-10-CM | POA: Diagnosis not present

## 2018-02-22 LAB — POCT URINALYSIS DIPSTICK
APPEARANCE: NORMAL
BILIRUBIN UA: NEGATIVE
GLUCOSE UA: NEGATIVE
KETONES UA: NEGATIVE
Leukocytes, UA: NEGATIVE
Nitrite, UA: NEGATIVE
Protein, UA: NEGATIVE
RBC UA: NEGATIVE
Spec Grav, UA: 1.01 (ref 1.010–1.025)
Urobilinogen, UA: 0.2 E.U./dL
pH, UA: 6 (ref 5.0–8.0)

## 2018-02-22 MED ORDER — PANTOPRAZOLE SODIUM 40 MG PO TBEC: 40.0000 mg | DELAYED_RELEASE_TABLET | Freq: Every day | ORAL | 3 refills | Status: DC

## 2018-02-22 NOTE — Progress Notes (Signed)
Patient: Tami Williams Female    DOB: 1965-11-11   52 y.o.   MRN: [id number] Visit Date: 02/22/2018  Today's Provider: Margaretann Loveless, PA-C   Chief Complaint  Patient presents with  . Headache  . Dysuria   Subjective:    Headache   This is a new problem. The current episode started in the past 7 days. The problem occurs daily (in the past 3 days she has wake up with the headache). The problem has been unchanged. The pain is located in the frontal region. The pain does not radiate. The pain quality is similar to prior headaches. The quality of the pain is described as throbbing. The pain is at a severity of 6/10. The pain is moderate. Associated symptoms include eye pain (Left eye) and insomnia (since the weekend 6 hrs of sleep with waking up in between and tossing and turning). Pertinent negatives include no blurred vision, coughing, ear pain, facial sweating, fever, numbness, photophobia, sinus pressure or visual change. Associated symptoms comments: Reports that on Saturday out of nowhere she felt like a lot of sweat was in her face and she check her blood pressure and it was 145/114. Nothing aggravates the symptoms. Treatments tried: Ibuprofen, BC. The treatment provided significant relief.  Dysuria   This is a new problem. The current episode started yesterday. The problem has been unchanged. Quality: "discomfort yesterday" The patient is experiencing no pain. There has been no fever. Pertinent negatives include no flank pain, frequency, hematuria or urgency. She has tried nothing for the symptoms. The treatment provided no relief.    Patient requesting refill on the following medication Pantoprazole.  No current headache, reports headache improved with BC powder on Saturday. Her main complaint is left eye pain. She reports left eye pain with squeezing her eyelid shut. When asked if the pain felt like it was her eyelid vs her eyeball she stated her eyeball. She denies redness,  discharge, lumps, or visual changes. Currently sees [facility]. Had eye exam earlier this year. Does wear contacts.     Allergies  Allergen Reactions  . No Known Allergies      Current Outpatient Medications:  .  lisinopril (PRINIVIL,ZESTRIL) 10 MG tablet, TAKE 1 TABLET(10 MG) BY MOUTH DAILY, Disp: 90 tablet, Rfl: 1 .  SYNTHROID 50 MCG tablet, TK 1 T PO QD, Disp: , Rfl: 6 .  acetaminophen (TYLENOL) 500 MG tablet, Take 500 mg by mouth every 6 (six) hours as needed., Disp: , Rfl:  .  fluticasone (FLONASE) 50 MCG/ACT nasal spray, Place 2 sprays into both nostrils as needed. , Disp: , Rfl:  .  ondansetron (ZOFRAN-ODT) 4 MG disintegrating tablet, Take by mouth., Disp: , Rfl:  .  pantoprazole (PROTONIX) 40 MG tablet, Take 1 tablet (40 mg total) by mouth daily., Disp: 90 tablet, Rfl: 3  Review of Systems  Constitutional: Negative for fever.  HENT: Negative for ear pain and sinus pressure.   Eyes: Positive for pain (Left eye). Negative for blurred vision and photophobia.  Respiratory: Negative for cough.   Genitourinary: Positive for dysuria. Negative for flank pain, frequency, hematuria and urgency.  Neurological: Positive for headaches. Negative for numbness.  Psychiatric/Behavioral: The patient has insomnia (since the weekend 6 hrs of sleep with waking up in between and tossing and turning).     Social History   Tobacco Use  . Smoking status: Never Smoker  . Smokeless tobacco: Never Used  Substance Use Topics  . Alcohol  use: Yes    Alcohol/week: 0.0 standard drinks    Comment: occasional   Objective:   BP 140/88 (BP Location: Left Arm, Patient Position: Sitting, Cuff Size: Normal)   Pulse 68   Temp (!) 97.5 F (36.4 C) (Oral)   Resp 16   Wt 131 lb (59.4 kg)   SpO2 98%   BMI 22.49 kg/m  Vitals:   02/22/18 1600  BP: 140/88  Pulse: 68  Resp: 16  Temp: (!) 97.5 F (36.4 C)  TempSrc: Oral  SpO2: 98%  Weight: 131 lb (59.4 kg)     Physical Exam  Constitutional: She is  oriented to person, place, and time. She appears well-developed and well-nourished. No distress.  HENT:  Head: Normocephalic and atraumatic.  Right Ear: Hearing, tympanic membrane, external ear and ear canal normal.  Left Ear: Hearing, tympanic membrane, external ear and ear canal normal.  Nose: Nose normal. Right sinus exhibits no maxillary sinus tenderness and no frontal sinus tenderness. Left sinus exhibits no maxillary sinus tenderness and no frontal sinus tenderness.  Mouth/Throat: Uvula is midline, oropharynx is clear and moist and mucous membranes are normal. No oropharyngeal exudate.  Eyes: Pupils are equal, round, and reactive to light. Conjunctivae, EOM and lids are normal. Right eye exhibits no discharge, no exudate and no hordeolum. Left eye exhibits no discharge, no exudate and no hordeolum.  Neck: Normal range of motion. Neck supple. No JVD present. No tracheal deviation present. No Brudzinski's sign and no Kernig's sign noted. No thyromegaly present.  Cardiovascular: Normal rate, regular rhythm and normal heart sounds. Exam reveals no gallop and no friction rub.  No murmur heard. Pulmonary/Chest: Effort normal and breath sounds normal. No stridor. No respiratory distress. She has no wheezes. She has no rales. She exhibits no tenderness.  Lymphadenopathy:    She has no cervical adenopathy.  Neurological: She is alert and oriented to person, place, and time. She has normal strength. No cranial nerve deficit or sensory deficit. She displays a negative Romberg sign. Coordination and gait normal.  Skin: Skin is warm and dry. She is not diaphoretic.  Vitals reviewed.      Assessment & Plan:     1. Pain of left eye Unsure of cause as exam is unremarkable. Advised to use warm compresses and tylenol prn. Possible early onset hordeolum. Advised if pain worsens or visual disturbances start she is to call the office, go to the ER if after hours or call her eye doctor. She agrees with plan.     2. Acute nonintractable headache, unspecified headache type Improved after BC powder on Saturday. No headache currently.   3. Possible urinary tract infection Urine normal.  - POCT urinalysis dipstick  4. Diaphragmatic hernia without obstruction and without gangrene Stable. Diagnosis pulled for medication refill. Continue current medical treatment plan. - pantoprazole (PROTONIX) 40 MG tablet; Take 1 tablet (40 mg total) by mouth daily.  Dispense: 90 tablet; Refill: 3  5. Need for influenza vaccination Flu vaccine given today without complication. Patient sat upright for 15 minutes to check for adverse reaction before being released. - Flu Vaccine QUAD 36+ mos IM       Margaretann Loveless, PA-C  [facility]

## 2018-04-06 ENCOUNTER — Encounter: Payer: Self-pay | Admitting: Physician Assistant

## 2018-04-09 ENCOUNTER — Ambulatory Visit (INDEPENDENT_AMBULATORY_CARE_PROVIDER_SITE_OTHER): Payer: BC Managed Care – PPO

## 2018-04-09 ENCOUNTER — Encounter: Payer: Self-pay | Admitting: Podiatry

## 2018-04-09 ENCOUNTER — Ambulatory Visit: Payer: BC Managed Care – PPO | Admitting: Podiatry

## 2018-04-09 DIAGNOSIS — M779 Enthesopathy, unspecified: Secondary | ICD-10-CM | POA: Diagnosis not present

## 2018-04-09 DIAGNOSIS — Q828 Other specified congenital malformations of skin: Secondary | ICD-10-CM

## 2018-04-09 DIAGNOSIS — M2012 Hallux valgus (acquired), left foot: Secondary | ICD-10-CM | POA: Diagnosis not present

## 2018-04-09 DIAGNOSIS — M778 Other enthesopathies, not elsewhere classified: Secondary | ICD-10-CM

## 2018-04-10 NOTE — Progress Notes (Signed)
She presents today with chief complaint of painful first metatarsophalangeal joint pain times the past 2 to 3 months left foot.  States that the pain is sharp and feels like needles sticking in the toe at times.  She states that the pain radiates into the medial side of the foot and is constant ache.  She is been taking Motrin for pain she also complaining of painful corn on the fifth toe x6 months left foot.  Objective: Vital signs are stable she is alert and oriented x3 pulses are palpable.  She has painful range of motion of the first metatarsophalangeal joint along the plantar medial aspect of the left foot.  A painful porokeratotic lesion medial aspect of the second toe left.  Limited range of motion from previous surgery first metatarsal phalangeal joint left foot.  Assessment: Sesamoiditis capsulitis first metatarsophalangeal joint left.  Porokeratotic lesion fifth digit left.  Plan: Debridement of reactive hyperkeratosis after sterile Betadine skin prep.  I injected 20 mg Kenalog 5 mg Marcaine what appears to be letter medial aspect of the foot into scar tissue possibly.  Injuring the plantar aspect of the first metatarsophalangeal joint from the plantar medial aspect around the sesamoids.  We will follow-up with her in about 6 weeks to see how she is doing.  If not improved we will consider surgical intervention.

## 2018-04-15 HISTORY — PX: FOOT SURGERY: SHX648

## 2018-04-19 ENCOUNTER — Telehealth: Payer: Self-pay | Admitting: Podiatry

## 2018-04-19 NOTE — Telephone Encounter (Signed)
Patient said injection is not working and Dr. Milinda Pointer, said that the next approach would be surgery. She is interested in scheduling surgery for this year since she has already met her deductible. Pt. Transferred to surgical scheduler.

## 2018-04-23 ENCOUNTER — Telehealth: Payer: Self-pay | Admitting: Podiatry

## 2018-04-23 NOTE — Telephone Encounter (Signed)
You can look at the book.  I don't really know but I think it is really tight.

## 2018-04-23 NOTE — Telephone Encounter (Signed)
I had left a message last week and I still have not heard back. I'm trying to see if Dr. Al CorpusHyatt has any openings in December to do surgery. Please call me back and let me know either way. My number is [phone]. Thank you.

## 2018-04-23 NOTE — Telephone Encounter (Signed)
Maybe thursday

## 2018-04-23 NOTE — Telephone Encounter (Signed)
I know you opened up next Monday. If you have availability in the [place] office this week to see her to sign her consent forms, could I get her scheduled for Monday?

## 2018-04-23 NOTE — Telephone Encounter (Signed)
Called pt and got her scheduled for a surgery consult in the [place] office on Thursday, 12 December at 2:45 pm.

## 2018-04-26 ENCOUNTER — Ambulatory Visit: Payer: BC Managed Care – PPO | Admitting: Podiatry

## 2018-04-26 ENCOUNTER — Encounter: Payer: Self-pay | Admitting: Podiatry

## 2018-04-26 DIAGNOSIS — M7752 Other enthesopathy of left foot: Secondary | ICD-10-CM

## 2018-04-26 DIAGNOSIS — M778 Other enthesopathies, not elsewhere classified: Secondary | ICD-10-CM

## 2018-04-26 DIAGNOSIS — M779 Enthesopathy, unspecified: Principal | ICD-10-CM

## 2018-04-26 NOTE — Progress Notes (Signed)
She presents today chief complaint of painful first metatarsal phalangeal joint fifth toe.  We had discussed the need for surgical interventions at this point time were going to go over surgical consent form.  Objective: Vital signs are stable she is alert and oriented x3 there is no erythema edema cellulitis drainage or odor she has good range of motion of all of the toes with exception of the first metatarsophalangeal joint left.  She also has a painful reactive 5 keratome and medial aspect of the fifth digit of the left foot.  Assessment: Pain in limb secondary to restricted range of motion of the first metatarsophalangeal joint left and painful internal fixation left as well as a painful reactive hyper keratoma fifth left.  Plan: Discussed etiology pathology and surgical therapies at this point in time were going to perform a excision of thickened scar with removal of internal fixation and release of the first metatarsal phalangeal joint.  I am also going to perform an exostectomy fifth digit left foot.  I will follow-up with her in the near future surgical intervention she was provided with both oral and written restriction for the care and of her foot in the morning of the preop as well as information regarding the surgery center and anesthesia group.

## 2018-04-26 NOTE — Patient Instructions (Signed)
Pre-Operative Instructions  Congratulations, you have decided to take an important step towards improving your quality of life.  You can be assured that the doctors and staff at [facility] will be with you every step of the way.  Here are some important things you should know:  1. Plan to be at the surgery center/hospital at least 1 (one) hour prior to your scheduled time, unless otherwise directed by the surgical center/hospital staff.  You must have a responsible adult accompany you, remain during the surgery and drive you home.  Make sure you have directions to the surgical center/hospital to ensure you arrive on time. 2. If you are having surgery at [facility] or [place] hospitals, you will need a copy of your medical history and physical form from your family physician within one month prior to the date of surgery. We will give you a form for your primary physician to complete.  3. We make every effort to accommodate the date you request for surgery.  However, there are times where surgery dates or times have to be moved.  We will contact you as soon as possible if a change in schedule is required.   4. No aspirin/ibuprofen for one week before surgery.  If you are on aspirin, any non-steroidal anti-inflammatory medications (Mobic, Aleve, Ibuprofen) should not be taken seven (7) days prior to your surgery.  You make take Tylenol for pain prior to surgery.  5. Medications - If you are taking daily heart and blood pressure medications, seizure, reflux, allergy, asthma, anxiety, pain or diabetes medications, make sure you notify the surgery center/hospital before the day of surgery so they can tell you which medications you should take or avoid the day of surgery. 6. No food or drink after midnight the night before surgery unless directed otherwise by surgical center/hospital staff. 7. No alcoholic beverages 24-hours prior to surgery.  No smoking 24-hours prior or 24-hours after  surgery. 8. Wear loose pants or shorts. They should be loose enough to fit over bandages, boots, and casts. 9. Don't wear slip-on shoes. Sneakers are preferred. 10. Bring your boot with you to the surgery center/hospital.  Also bring crutches or a walker if your physician has prescribed it for you.  If you do not have this equipment, it will be provided for you after surgery. 11. If you have not been contacted by the surgery center/hospital by the day before your surgery, call to confirm the date and time of your surgery. 12. Leave-time from work may vary depending on the type of surgery you have.  Appropriate arrangements should be made prior to surgery with your employer. 13. Prescriptions will be provided immediately following surgery by your doctor.  Fill these as soon as possible after surgery and take the medication as directed. Pain medications will not be refilled on weekends and must be approved by the doctor. 14. Remove nail polish on the operative foot and avoid getting pedicures prior to surgery. 15. Wash the night before surgery.  The night before surgery wash the foot and leg well with water and the antibacterial soap provided. Be sure to pay special attention to beneath the toenails and in between the toes.  Wash for at least three (3) minutes. Rinse thoroughly with water and dry well with a towel.  Perform this wash unless told not to do so by your physician.  Enclosed: 1 Ice pack (please put in freezer the night before surgery)   1 Hibiclens skin cleaner     Pre-op instructions  If you have any questions regarding the instructions, please do not hesitate to call our office.  [place]: [address], [place], [place] [address] -- [phone]  [place]: [address] [place], [place] [address] -- [phone]  [place]: [address]  [place], [place] [address] -- [phone]  [place]: [address], [address], [place],  [address] -- [phone]  Website:  [email or web address] 

## 2018-04-30 ENCOUNTER — Other Ambulatory Visit: Payer: Self-pay | Admitting: Podiatry

## 2018-04-30 ENCOUNTER — Encounter: Payer: Self-pay | Admitting: Podiatry

## 2018-04-30 DIAGNOSIS — M7752 Other enthesopathy of left foot: Secondary | ICD-10-CM | POA: Diagnosis not present

## 2018-04-30 DIAGNOSIS — Z4889 Encounter for other specified surgical aftercare: Secondary | ICD-10-CM

## 2018-04-30 DIAGNOSIS — M25775 Osteophyte, left foot: Secondary | ICD-10-CM

## 2018-04-30 MED ORDER — OXYCODONE-ACETAMINOPHEN 10-325 MG PO TABS: 1.0000 | ORAL_TABLET | Freq: Four times a day (QID) | ORAL | 0 refills | Status: AC | PRN

## 2018-04-30 MED ORDER — ONDANSETRON HCL 4 MG PO TABS: 4.0000 mg | ORAL_TABLET | Freq: Three times a day (TID) | ORAL | 0 refills | Status: DC | PRN

## 2018-04-30 MED ORDER — CEPHALEXIN 500 MG PO CAPS: 500.0000 mg | ORAL_CAPSULE | Freq: Three times a day (TID) | ORAL | 0 refills | Status: DC

## 2018-05-01 ENCOUNTER — Telehealth: Payer: Self-pay | Admitting: Podiatry

## 2018-05-01 NOTE — Telephone Encounter (Signed)
Patient came to office requesting crutches, She was informed that we do not carry crutches here.  She can call local pharmacies or go to [facility] to purchase crutches.

## 2018-05-01 NOTE — Telephone Encounter (Signed)
Patient is calling because she had surgery yesterday and she wanted to know could she have a pair of crutches. Please call patient back.

## 2018-05-02 ENCOUNTER — Encounter: Payer: Self-pay | Admitting: Podiatry

## 2018-05-02 ENCOUNTER — Telehealth: Payer: Self-pay | Admitting: Podiatry

## 2018-05-02 NOTE — Telephone Encounter (Signed)
Pt had surgery on Monday and is wanting to know if the Doctor will allow her to go back to work tomorrow. Pt is not experiencing any pain or issues. States she will need a note for returning to work. Please give pt a call.

## 2018-05-02 NOTE — Telephone Encounter (Signed)
Per Dr. Al CorpusHyatt, ok to return to work tomorrow as long as she keeps foot propped up.   Patient has been notified of instructions and will pick up letter at front desk.

## 2018-05-07 ENCOUNTER — Encounter: Payer: Self-pay | Admitting: Podiatry

## 2018-05-07 ENCOUNTER — Ambulatory Visit (INDEPENDENT_AMBULATORY_CARE_PROVIDER_SITE_OTHER): Payer: BC Managed Care – PPO | Admitting: Podiatry

## 2018-05-07 ENCOUNTER — Ambulatory Visit (INDEPENDENT_AMBULATORY_CARE_PROVIDER_SITE_OTHER): Payer: BC Managed Care – PPO

## 2018-05-07 VITALS — BP 136/85 | HR 61 | Resp 16

## 2018-05-07 DIAGNOSIS — Z9889 Other specified postprocedural states: Secondary | ICD-10-CM

## 2018-05-07 DIAGNOSIS — M2012 Hallux valgus (acquired), left foot: Secondary | ICD-10-CM

## 2018-05-07 NOTE — Progress Notes (Signed)
She presents today for postop visit she is status post release of the first metatarsal phalangeal joint with removal of internal fixation as well as an exostectomy medial aspect of her fifth toe left.  She states that she is doing so much better now that her toe can move.  She is happy with the outcome.  Objective: Vital signs are stable alert oriented x3 she presents today in a cam walker.  Dressed on dressing was intact and clean and dry.  Was removed demonstrates no erythema edema cellulitis drainage or odor radiographs demonstrate complete resolution of spur fifth left as well as great range of motion of the first metatarsophalangeal joint.  Assessment: Well-healing surgical foot x1 week.  Plan: Redressed today dressed a compressive dressing follow-up with me in a week or so.

## 2018-05-11 ENCOUNTER — Encounter: Payer: BC Managed Care – PPO | Admitting: Physician Assistant

## 2018-05-15 ENCOUNTER — Encounter: Payer: Self-pay | Admitting: Podiatry

## 2018-05-15 ENCOUNTER — Encounter: Payer: BC Managed Care – PPO | Admitting: Podiatry

## 2018-05-15 ENCOUNTER — Ambulatory Visit (INDEPENDENT_AMBULATORY_CARE_PROVIDER_SITE_OTHER): Payer: BC Managed Care – PPO | Admitting: Podiatry

## 2018-05-15 DIAGNOSIS — M2012 Hallux valgus (acquired), left foot: Secondary | ICD-10-CM

## 2018-05-15 DIAGNOSIS — Z9889 Other specified postprocedural states: Secondary | ICD-10-CM

## 2018-05-18 NOTE — Progress Notes (Signed)
   Subjective:  Patient presents today status post 1st MPJ release and 5th toe exostectomy left. DOS: 04/30/18. She states she is doing well overall. She reports some mild aching in the toes. She reports significant soreness in the 5th toe. She has been using the CAM boot as directed. She denies modifying factors. Patient is here for further evaluation and treatment.   Past Medical History:  Diagnosis Date  . Acute epigastric pain   . GAD (generalized anxiety disorder)   . GERD (gastroesophageal reflux disease)   . Hypertension   . Non-cardiac chest pain       Objective/Physical Exam Neurovascular status intact.  Skin incisions appear to be well coapted with sutures and staples intact. No sign of infectious process noted. No dehiscence. No active bleeding noted. Moderate edema noted to the surgical extremity.  Assessment: 1. s/p 1st MPJ release and 5th toe exostectomy. DOS: 04/30/18   Plan of Care:  1. Patient was evaluated. 2. Remaining sutures removed. Recommended antibiotic ointment daily with a bandage.  3. Post op shoe dispensed. Discontinue using CAM boot.  4. Return to clinic in 2 weeks with Dr. Al Corpus.    Felecia Shelling, DPM [facility]  Dr. Felecia Shelling, DPM    [address]                                        [place], [place] [address]                Office [phone]  Fax [phone]

## 2018-05-21 ENCOUNTER — Ambulatory Visit: Payer: BC Managed Care – PPO | Admitting: Podiatry

## 2018-05-30 ENCOUNTER — Ambulatory Visit (INDEPENDENT_AMBULATORY_CARE_PROVIDER_SITE_OTHER): Payer: BC Managed Care – PPO | Admitting: Podiatry

## 2018-05-30 ENCOUNTER — Ambulatory Visit (INDEPENDENT_AMBULATORY_CARE_PROVIDER_SITE_OTHER): Payer: BC Managed Care – PPO

## 2018-05-30 ENCOUNTER — Encounter: Payer: Self-pay | Admitting: Podiatry

## 2018-05-30 DIAGNOSIS — M2012 Hallux valgus (acquired), left foot: Secondary | ICD-10-CM

## 2018-05-30 DIAGNOSIS — Z9889 Other specified postprocedural states: Secondary | ICD-10-CM

## 2018-05-30 MED ORDER — DOXYCYCLINE HYCLATE 100 MG PO TABS: 100.0000 mg | ORAL_TABLET | Freq: Two times a day (BID) | ORAL | 0 refills | Status: DC

## 2018-05-30 MED ORDER — MUPIROCIN 2 % EX OINT: TOPICAL_OINTMENT | CUTANEOUS | 1 refills | Status: DC

## 2018-05-30 NOTE — Progress Notes (Signed)
She presents today date of surgery 04/30/2018 removal fixation deep K wire/screw first metatarsal left.  With a capsulotomy release first exostectomy fifth digit left states that her big toe is doing great she has just a little stiffness.  But she states her little toe is really sore and was draining a bit.  She denies fever chills nausea vomiting muscle aches pains calf pain back pain chest pain shortness of breath.  Objective: She has great range of motion of the first metatarsal phalangeal joint fifth toe does demonstrate some erythema and edema she also has a superficial ulceration where the skin had sloughed off with her exostectomy site.  Assessment: Well-healing first metatarsal phalangeal joint.  She has a superficial ulceration fifth digit left.  Plan: At this point start her on doxycycline and Bactroban ointment to be applied daily under occlusion during the day but leave open at bedtime.  And I will follow-up with her in 2 weeks.

## 2018-06-13 ENCOUNTER — Encounter: Payer: Self-pay | Admitting: Podiatry

## 2018-06-13 ENCOUNTER — Ambulatory Visit (INDEPENDENT_AMBULATORY_CARE_PROVIDER_SITE_OTHER): Payer: BC Managed Care – PPO | Admitting: Podiatry

## 2018-06-13 DIAGNOSIS — L97521 Non-pressure chronic ulcer of other part of left foot limited to breakdown of skin: Secondary | ICD-10-CM

## 2018-06-13 NOTE — Progress Notes (Signed)
She presents today for follow-up of the fifth toe left foot seems to be doing a lot better she says she was concerned about the ulceration from the previous exostectomy.  Objective: Vitals are stable alert oriented x3 there is a scab that is present the wound appears to be healing very nicely she is got great range of motion at the fifth metatarsal phalangeal joint and the first metatarsophalangeal joint.  Assessment: Well-healing surgical foot.  Plan: Follow-up with me in 1 month or as needed.

## 2018-06-13 NOTE — Progress Notes (Signed)
Patient: Tami Williams, Female    DOB: 1966-05-16, 53 y.o.   MRN: [id number] Visit Date: 06/15/2018  Today's Provider: Margaretann Loveless, PA-C   Chief Complaint  Patient presents with  . Annual Exam   Subjective:    Annual physical exam Tami Williams is a 53 y.o. female who presents today for health maintenance and complete physical. She feels well. She reports exercising yes/3 days a wek. She reports she is sleeping poorly. ----------------------------------------------------------------   Review of Systems  Constitutional: Positive for diaphoresis.  HENT: Negative.   Eyes: Negative.   Respiratory: Negative.   Cardiovascular: Negative.   Gastrointestinal: Negative.   Endocrine: Negative.   Genitourinary: Negative.   Musculoskeletal: Negative.   Skin: Negative.   Allergic/Immunologic: Negative.   Neurological: Negative.   Hematological: Negative.   Psychiatric/Behavioral: Positive for sleep disturbance.    Social History      She  reports that she has never smoked. She has never used smokeless tobacco. She reports current alcohol use. She reports that she does not use drugs.       Social History   Socioeconomic History  . Marital status: Married    Spouse name: Roschelle Batta  . Number of children: 3  . Years of education: GED  . Highest education level: Not on file  Occupational History    Employer: [employer]  Social Needs  . Financial resource strain: Not on file  . Food insecurity:    Worry: Not on file    Inability: Not on file  . Transportation needs:    Medical: Not on file    Non-medical: Not on file  Tobacco Use  . Smoking status: Never Smoker  . Smokeless tobacco: Never Used  Substance and Sexual Activity  . Alcohol use: Yes    Alcohol/week: 0.0 standard drinks    Comment: occasional  . Drug use: No  . Sexual activity: Yes  Lifestyle  . Physical activity:    Days per week: Not on file    Minutes per session: Not on file  . Stress: Not  on file  Relationships  . Social connections:    Talks on phone: Not on file    Gets together: Not on file    Attends religious service: Not on file    Active member of club or organization: Not on file    Attends meetings of clubs or organizations: Not on file    Relationship status: Not on file  Other Topics Concern  . Not on file  Social History Narrative  . Not on file    Past Medical History:  Diagnosis Date  . Acute epigastric pain   . GAD (generalized anxiety disorder)   . GERD (gastroesophageal reflux disease)   . Hypertension   . Non-cardiac chest pain      Patient Active Problem List   Diagnosis Date Noted  . Biliary dyskinesia 12/07/2016  . Perimenopausal symptoms 09/13/2016  . Allergic rhinitis 03/20/2015  . Cold sore 03/20/2015  . Abnormal liver enzymes 03/20/2015  . 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 Hypertension 03/20/2015  . Diaphragmatic hernia 03/20/2015  . 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 Anxiety disorder 03/20/2015  . Acid reflux 09/23/2007  . Gastro-esophageal reflux disease without esophagitis 09/23/2007    Past Surgical History:  Procedure Laterality Date  . 24 HOUR PH STUDY N/A 01/06/2016   Procedure: 24 HOUR PH STUDY;  Surgeon: Midge Minium, MD;  Location: [facility]  ENDOSCOPY;  Service: Endoscopy;  Laterality: N/A;  . ABDOMINAL HYSTERECTOMY     due to endometriosis  . ESOPHAGEAL MANOMETRY N/A 01/06/2016   Procedure: ESOPHAGEAL MANOMETRY (EM);  Surgeon: Midge Miniumarren Wohl, MD;  Location: [facility] ENDOSCOPY;  Service: Endoscopy;  Laterality: N/A;  . ESOPHAGOGASTRODUODENOSCOPY    . ESOPHAGOGASTRODUODENOSCOPY (EGD) WITH PROPOFOL N/A 12/02/2015   Procedure: ESOPHAGOGASTRODUODENOSCOPY (EGD) WITH PROPOFOL;  Surgeon: Scot Junobert T Elliott, MD;  [facility]COPY;  Service: Endoscopy;  Laterality: N/A;  . FOOT SURGERY Left 04/2018  . NISSEN FUNDOPLICATION  2007   dr. Katrinka Blazingsmith    Family History        Family  Status  Relation Name Status  . Mother  Alive  . Father  Alive  . Brother half brother Alive  . Brother half brother Alive        Her family history includes Diabetes in her mother; Healthy in her brother and brother; Heart disease in her mother; Heart failure in her mother; Hypertension in her father and mother.      Allergies  Allergen Reactions  . No Known Allergies      Current Outpatient Medications:  .  fluticasone (FLONASE) 50 MCG/ACT nasal spray, Place 2 sprays into both nostrils as needed. , Disp: , Rfl:  .  lisinopril (PRINIVIL,ZESTRIL) 10 MG tablet, TAKE 1 TABLET(10 MG) BY MOUTH DAILY, Disp: 90 tablet, Rfl: 1 .  mupirocin ointment (BACTROBAN) 2 %, Apply to wound after soaking BID, Disp: 30 g, Rfl: 1 .  ondansetron (ZOFRAN) 4 MG tablet, Take 1 tablet (4 mg total) by mouth every 8 (eight) hours as needed for nausea or vomiting., Disp: 20 tablet, Rfl: 0 .  pantoprazole (PROTONIX) 40 MG tablet, Take 1 tablet (40 mg total) by mouth daily., Disp: 90 tablet, Rfl: 3 .  SYNTHROID 50 MCG tablet, TK 1 T PO QD, Disp: , Rfl: 6 .  acetaminophen (TYLENOL) 500 MG tablet, Take 500 mg by mouth every 6 (six) hours as needed., Disp: , Rfl:  .  ondansetron (ZOFRAN-ODT) 4 MG disintegrating tablet, Take by mouth., Disp: , Rfl:    Patient Care Team: Reine JustBurnette, Mujahid Jalomo M, PA-C as PCP - General (Family Medicine)      Objective:   Vitals: BP (!) 132/92 (BP Location: Left Arm, Patient Position: Sitting, Cuff Size: Normal)   Pulse 76   Temp 97.8 F (36.6 C) (Oral)   Resp 16   Ht 5\' 4"  (1.626 m)   Wt 134 lb (60.8 kg)   SpO2 99%   BMI 23.00 kg/m    Vitals:   06/15/18 1359  BP: (!) 132/92  Pulse: 76  Resp: 16  Temp: 97.8 F (36.6 C)  TempSrc: Oral  SpO2: 99%  Weight: 134 lb (60.8 kg)  Height: 5\' 4"  (1.626 m)     Physical Exam Vitals signs reviewed.  Constitutional:      General: She is not in acute distress.    Appearance: She is well-developed. She is not diaphoretic.  HENT:       Head: Normocephalic and atraumatic.     Right Ear: Tympanic membrane, ear canal and external ear normal.     Left Ear: Tympanic membrane, ear canal and external ear normal.     Nose: Nose normal.     Mouth/Throat:     Pharynx: No oropharyngeal exudate.  Eyes:     General: No scleral icterus.       Right eye: No discharge.        Left eye:  No discharge.     Conjunctiva/sclera: Conjunctivae normal.     Pupils: Pupils are equal, round, and reactive to light.  Neck:     Musculoskeletal: Normal range of motion and neck supple.     Thyroid: No thyromegaly.     Vascular: No carotid bruit or JVD.     Trachea: No tracheal deviation.  Cardiovascular:     Rate and Rhythm: Normal rate and regular rhythm.     Heart sounds: Normal heart sounds. No murmur. No friction rub. No gallop.   Pulmonary:     Effort: Pulmonary effort is normal. No respiratory distress.     Breath sounds: Normal breath sounds. No wheezing or rales.  Chest:     Chest wall: No tenderness.     Breasts:        Right: Normal. No mass, skin change or tenderness.        Left: Normal. No mass, skin change or tenderness.  Abdominal:     General: Bowel sounds are normal. There is no distension.     Palpations: Abdomen is soft. There is no mass.     Tenderness: There is no abdominal tenderness. There is no guarding or rebound.  Musculoskeletal: Normal range of motion.        General: No tenderness.  Lymphadenopathy:     Cervical: No cervical adenopathy.     Upper Body:     Right upper body: No supraclavicular, axillary or pectoral adenopathy.     Left upper body: No supraclavicular, axillary or pectoral adenopathy.  Skin:    General: Skin is warm and dry.     Findings: No rash.  Neurological:     Mental Status: She is alert and oriented to person, place, and time.  Psychiatric:        Behavior: Behavior normal.        Thought Content: Thought content normal.        Judgment: Judgment normal.      Depression  Screen PHQ 2/9 Scores 06/15/2018 02/22/2017 09/12/2016 09/09/2015  PHQ - 2 Score 0 3 0 0  PHQ- 9 Score 3 10 1  -      Assessment & Plan:     Routine Health Maintenance and Physical Exam  Exercise Activities and Dietary recommendations Goals   None     Immunization History  Administered Date(s) Administered  . Influenza,inj,Quad PF,6+ Mos 02/27/2016, 02/22/2017, 02/22/2018  . Tdap 02/22/2017    Health Maintenance  Topic Date Due  . HIV Screening  10/06/1982  . PAP SMEAR-Modifier  09/12/2015  . COLONOSCOPY  10/05/2017  . MAMMOGRAM  08/31/2019  . TETANUS/TDAP  02/23/2027  . INFLUENZA VACCINE  Completed     Discussed health benefits of physical activity, and encouraged her to engage in regular exercise appropriate for her age and condition.    1. Annual physical exam Normal physical exam today. Will check labs as below and f/u pending lab results. If labs are stable and WNL she will not need to have these rechecked for one year at her next annual physical exam. She is to call the office in the meantime if she has any acute issue, questions or concerns. - CBC w/Diff/Platelet - Comprehensive Metabolic Panel (CMET) - Lipid Profile - HgB A1c  2. Breast cancer screening Breast exam today was normal. There is no family history of breast cancer. She does perform regular self breast exams. Mammogram was ordered as below. [facility] was given to patient so she  may schedule her mammogram at her convenience. Due for mammogram in April.   3. Colon cancer screening Due for colon cancer screening. No previous colonoscopy. No family history of colon cancer. Cologuard ordered as below.  - Cologuard  4. Nausea Stable. Diagnosis pulled for medication refill. Continue current medical treatment plan. - ondansetron (ZOFRAN) 4 MG tablet; Take 1 tablet (4 mg total) by mouth every 8 (eight) hours as needed for nausea or vomiting.  Dispense: 20 tablet; Refill:  1  --------------------------------------------------------------------    Margaretann Loveless, PA-C  [facility]

## 2018-06-15 ENCOUNTER — Encounter: Payer: Self-pay | Admitting: Physician Assistant

## 2018-06-15 ENCOUNTER — Ambulatory Visit (INDEPENDENT_AMBULATORY_CARE_PROVIDER_SITE_OTHER): Payer: BC Managed Care – PPO | Admitting: Physician Assistant

## 2018-06-15 VITALS — BP 132/92 | HR 76 | Temp 97.8°F | Resp 16 | Ht 64.0 in | Wt 134.0 lb

## 2018-06-15 DIAGNOSIS — Z0000 Encounter for general adult medical examination without abnormal findings: Secondary | ICD-10-CM | POA: Diagnosis not present

## 2018-06-15 DIAGNOSIS — R110 Nausea: Secondary | ICD-10-CM

## 2018-06-15 DIAGNOSIS — Z1211 Encounter for screening for malignant neoplasm of colon: Secondary | ICD-10-CM | POA: Diagnosis not present

## 2018-06-15 DIAGNOSIS — Z1239 Encounter for other screening for malignant neoplasm of breast: Secondary | ICD-10-CM

## 2018-06-15 MED ORDER — ONDANSETRON HCL 4 MG PO TABS: 4.0000 mg | ORAL_TABLET | Freq: Three times a day (TID) | ORAL | 1 refills | Status: DC | PRN

## 2018-06-15 NOTE — Patient Instructions (Signed)
Health Maintenance for Postmenopausal Women Menopause is a normal process in which your reproductive ability comes to an end. This process happens gradually over a span of months to years, usually between the ages of 62 and 89. Menopause is complete when you have missed 12 consecutive menstrual periods. It is important to talk with your health care provider about some of the most common conditions that affect postmenopausal women, such as heart disease, cancer, and bone loss (osteoporosis). Adopting a healthy lifestyle and getting preventive care can help to promote your health and wellness. Those actions can also lower your chances of developing some of these common conditions. What should I know about menopause? During menopause, you may experience a number of symptoms, such as:  Moderate-to-severe hot flashes.  Night sweats.  Decrease in sex drive.  Mood swings.  Headaches.  Tiredness.  Irritability.  Memory problems.  Insomnia. Choosing to treat or not to treat menopausal changes is an individual decision that you make with your health care provider. What should I know about hormone replacement therapy and supplements? Hormone therapy products are effective for treating symptoms that are associated with menopause, such as hot flashes and night sweats. Hormone replacement carries certain risks, especially as you become older. If you are thinking about using estrogen or estrogen with progestin treatments, discuss the benefits and risks with your health care provider. What should I know about heart disease and stroke? Heart disease, heart attack, and stroke become more likely as you age. This may be due, in part, to the hormonal changes that your body experiences during menopause. These can affect how your body processes dietary fats, triglycerides, and cholesterol. Heart attack and stroke are both medical emergencies. There are many things that you can do to help prevent heart disease  and stroke:  Have your blood pressure checked at least every 1-2 years. High blood pressure causes heart disease and increases the risk of stroke.  If you are 79-72 years old, ask your health care provider if you should take aspirin to prevent a heart attack or a stroke.  Do not use any tobacco products, including cigarettes, chewing tobacco, or electronic cigarettes. If you need help quitting, ask your health care provider.  It is important to eat a healthy diet and maintain a healthy weight. ? Be sure to include plenty of vegetables, fruits, low-fat dairy products, and lean protein. ? Avoid eating foods that are high in solid fats, added sugars, or salt (sodium).  Get regular exercise. This is one of the most important things that you can do for your health. ? Try to exercise for at least 150 minutes each week. The type of exercise that you do should increase your heart rate and make you sweat. This is known as moderate-intensity exercise. ? Try to do strengthening exercises at least twice each week. Do these in addition to the moderate-intensity exercise.  Know your numbers.Ask your health care provider to check your cholesterol and your blood glucose. Continue to have your blood tested as directed by your health care provider.  What should I know about cancer screening? There are several types of cancer. Take the following steps to reduce your risk and to catch any cancer development as early as possible. Breast Cancer  Practice breast self-awareness. ? This means understanding how your breasts normally appear and feel. ? It also means doing regular breast self-exams. Let your health care provider know about any changes, no matter how small.  If you are 40 or  older, have a clinician do a breast exam (clinical breast exam or CBE) every year. Depending on your age, family history, and medical history, it may be recommended that you also have a yearly breast X-ray (mammogram).  If you  have a family history of breast cancer, talk with your health care provider about genetic screening.  If you are at high risk for breast cancer, talk with your health care provider about having an MRI and a mammogram every year.  Breast cancer (BRCA) gene test is recommended for women who have family members with BRCA-related cancers. Results of the assessment will determine the need for genetic counseling and BRCA1 and for BRCA2 testing. BRCA-related cancers include these types: ? Breast. This occurs in males or females. ? Ovarian. ? Tubal. This may also be called fallopian tube cancer. ? Cancer of the abdominal or pelvic lining (peritoneal cancer). ? Prostate. ? Pancreatic. Cervical, Uterine, and Ovarian Cancer Your health care provider may recommend that you be screened regularly for cancer of the pelvic organs. These include your ovaries, uterus, and vagina. This screening involves a pelvic exam, which includes checking for microscopic changes to the surface of your cervix (Pap test).  For women ages 21-65, health care providers may recommend a pelvic exam and a Pap test every three years. For women ages 39-65, they may recommend the Pap test and pelvic exam, combined with testing for human papilloma virus (HPV), every five years. Some types of HPV increase your risk of cervical cancer. Testing for HPV may also be done on women of any age who have unclear Pap test results.  Other health care providers may not recommend any screening for nonpregnant women who are considered low risk for pelvic cancer and have no symptoms. Ask your health care provider if a screening pelvic exam is right for you.  If you have had past treatment for cervical cancer or a condition that could lead to cancer, you need Pap tests and screening for cancer for at least 20 years after your treatment. If Pap tests have been discontinued for you, your risk factors (such as having a new sexual partner) need to be reassessed  to determine if you should start having screenings again. Some women have medical problems that increase the chance of getting cervical cancer. In these cases, your health care provider may recommend that you have screening and Pap tests more often.  If you have a family history of uterine cancer or ovarian cancer, talk with your health care provider about genetic screening.  If you have vaginal bleeding after reaching menopause, tell your health care provider.  There are currently no reliable tests available to screen for ovarian cancer. Lung Cancer Lung cancer screening is recommended for adults 57-50 years old who are at high risk for lung cancer because of a history of smoking. A yearly low-dose CT scan of the lungs is recommended if you:  Currently smoke.  Have a history of at least 30 pack-years of smoking and you currently smoke or have quit within the past 15 years. A pack-year is smoking an average of one pack of cigarettes per day for one year. Yearly screening should:  Continue until it has been 15 years since you quit.  Stop if you develop a health problem that would prevent you from having lung cancer treatment. Colorectal Cancer  This type of cancer can be detected and can often be prevented.  Routine colorectal cancer screening usually begins at age 12 and continues through  age 18.  If you have risk factors for colon cancer, your health care provider may recommend that you be screened at an earlier age.  If you have a family history of colorectal cancer, talk with your health care provider about genetic screening.  Your health care provider may also recommend using home test kits to check for hidden blood in your stool.  A small camera at the end of a tube can be used to examine your colon directly (sigmoidoscopy or colonoscopy). This is done to check for the earliest forms of colorectal cancer.  Direct examination of the colon should be repeated every 5-10 years until  age 61. However, if early forms of precancerous polyps or small growths are found or if you have a family history or genetic risk for colorectal cancer, you may need to be screened more often. Skin Cancer  Check your skin from head to toe regularly.  Monitor any moles. Be sure to tell your health care provider: ? About any new moles or changes in moles, especially if there is a change in a mole's shape or color. ? If you have a mole that is larger than the size of a pencil eraser.  If any of your family members has a history of skin cancer, especially at a young age, talk with your health care provider about genetic screening.  Always use sunscreen. Apply sunscreen liberally and repeatedly throughout the day.  Whenever you are outside, protect yourself by wearing long sleeves, pants, a wide-brimmed hat, and sunglasses. What should I know about osteoporosis? Osteoporosis is a condition in which bone destruction happens more quickly than new bone creation. After menopause, you may be at an increased risk for osteoporosis. To help prevent osteoporosis or the bone fractures that can happen because of osteoporosis, the following is recommended:  If you are 34-29 years old, get at least 1,000 mg of calcium and at least 600 mg of vitamin D per day.  If you are older than age 76 but younger than age 36, get at least 1,200 mg of calcium and at least 600 mg of vitamin D per day.  If you are older than age 72, get at least 1,200 mg of calcium and at least 800 mg of vitamin D per day. Smoking and excessive alcohol intake increase the risk of osteoporosis. Eat foods that are rich in calcium and vitamin D, and do weight-bearing exercises several times each week as directed by your health care provider. What should I know about how menopause affects my mental health? Depression may occur at any age, but it is more common as you become older. Common symptoms of depression include:  Low or sad  mood.  Changes in sleep patterns.  Changes in appetite or eating patterns.  Feeling an overall lack of motivation or enjoyment of activities that you previously enjoyed.  Frequent crying spells. Talk with your health care provider if you think that you are experiencing depression. What should I know about immunizations? It is important that you get and maintain your immunizations. These include:  Tetanus, diphtheria, and pertussis (Tdap) booster vaccine.  Influenza every year before the flu season begins.  Pneumonia vaccine.  Shingles vaccine. Your health care provider may also recommend other immunizations. This information is not intended to replace advice given to you by your health care provider. Make sure you discuss any questions you have with your health care provider. Document Released: 06/24/2005 Document Revised: 11/20/2015 Document Reviewed: 02/03/2015 Elsevier Interactive Patient Education  2019 [place].

## 2018-06-16 LAB — COMPREHENSIVE METABOLIC PANEL
A/G RATIO: 2 (ref 1.2–2.2)
ALBUMIN: 4.8 g/dL (ref 3.8–4.8)
ALK PHOS: 100 IU/L (ref 39–117)
ALT: 42 IU/L — ABNORMAL HIGH (ref 0–32)
AST: 37 IU/L (ref 0–40)
BUN / CREAT RATIO: 18 (ref 9–23)
BUN: 16 mg/dL (ref 6–24)
Bilirubin Total: 0.3 mg/dL (ref 0.0–1.2)
CO2: 28 mmol/L (ref 20–29)
CREATININE: 0.9 mg/dL (ref 0.57–1.00)
Calcium: 10.6 mg/dL — ABNORMAL HIGH (ref 8.7–10.2)
Chloride: 97 mmol/L (ref 96–106)
GFR calc Af Amer: 86 mL/min/{1.73_m2} (ref >59)
GFR calc non Af Amer: 75 mL/min/{1.73_m2} (ref >59)
GLOBULIN, TOTAL: 2.4 g/dL (ref 1.5–4.5)
Glucose: 90 mg/dL (ref 65–99)
POTASSIUM: 4.9 mmol/L (ref 3.5–5.2)
SODIUM: 140 mmol/L (ref 134–144)
Total Protein: 7.2 g/dL (ref 6.0–8.5)

## 2018-06-16 LAB — CBC WITH DIFFERENTIAL/PLATELET
BASOS ABS: 0 10*3/uL (ref 0.0–0.2)
Basos: 0 %
EOS (ABSOLUTE): 0.3 10*3/uL (ref 0.0–0.4)
Eos: 4 %
Hematocrit: 37.7 % (ref 34.0–46.6)
Hemoglobin: 12.7 g/dL (ref 11.1–15.9)
Immature Grans (Abs): 0 10*3/uL (ref 0.0–0.1)
Immature Granulocytes: 0 %
LYMPHS ABS: 2.7 10*3/uL (ref 0.7–3.1)
Lymphs: 36 %
MCH: 30.2 pg (ref 26.6–33.0)
MCHC: 33.7 g/dL (ref 31.5–35.7)
MCV: 90 fL (ref 79–97)
MONOS ABS: 0.6 10*3/uL (ref 0.1–0.9)
Monocytes: 8 %
Neutrophils Absolute: 3.9 10*3/uL (ref 1.4–7.0)
Neutrophils: 52 %
Platelets: 316 10*3/uL (ref 150–450)
RBC: 4.2 x10E6/uL (ref 3.77–5.28)
RDW: 11.9 % (ref 11.7–15.4)
WBC: 7.4 10*3/uL (ref 3.4–10.8)

## 2018-06-16 LAB — LIPID PANEL
CHOL/HDL RATIO: 2.4 ratio (ref 0.0–4.4)
CHOLESTEROL TOTAL: 207 mg/dL — AB (ref 100–199)
HDL: 86 mg/dL (ref >39)
LDL CALC: 106 mg/dL — AB (ref 0–99)
Triglycerides: 77 mg/dL (ref 0–149)
VLDL Cholesterol Cal: 15 mg/dL (ref 5–40)

## 2018-06-16 LAB — HEMOGLOBIN A1C
Est. average glucose Bld gHb Est-mCnc: 108 mg/dL
Hgb A1c MFr Bld: 5.4 % (ref 4.8–5.6)

## 2018-06-18 ENCOUNTER — Telehealth: Payer: Self-pay

## 2018-06-18 NOTE — Telephone Encounter (Signed)
Patient was advised and states she not on any Calcium medication. Patient states she will return to have lab redrawn.

## 2018-06-18 NOTE — Telephone Encounter (Signed)
Lab ordered for when she returns.

## 2018-06-18 NOTE — Telephone Encounter (Signed)
-----   Message from Margaretann Loveless, [place] sent at 06/18/2018  7:41 AM EST ----- Blood count is normal. Kidney function is normal. Calcium is borderline high. Recommend stopping any calcium supplements or calcium enriched foods and we can recheck calcium in 2-4 weeks. Liver enzyme remains borderline elevated but stable from last 2 years. Cholesterol shows slight improvement from last year. Continue healthy dietary habits and physical activity. A1c/sugar normal.

## 2018-07-18 ENCOUNTER — Encounter: Payer: BC Managed Care – PPO | Admitting: Podiatry

## 2018-07-25 ENCOUNTER — Encounter: Payer: BC Managed Care – PPO | Admitting: Podiatry

## 2018-09-05 ENCOUNTER — Telehealth: Payer: Self-pay

## 2018-09-05 LAB — PTH, INTACT AND CALCIUM
CALCIUM: 10.3 mg/dL — AB (ref 8.7–10.2)
PTH: 24 pg/mL (ref 15–65)

## 2018-09-05 NOTE — Telephone Encounter (Signed)
-----   Message from Margaretann Loveless, [place] sent at 09/05/2018  9:30 AM EDT ----- Calcium still only borderline high at 10.3, high normal is 10.2. I think we can just monitor every 6 months or so.

## 2018-09-05 NOTE — Telephone Encounter (Signed)
Unsure of cause unless she is taking a multi vitamin or getting extra external source of calcium. Her Parathyroid hormone is normal. Plus her calcium is really very borderline. I would not worry at this time unless it increases drastically.

## 2018-09-05 NOTE — Telephone Encounter (Signed)
Patient advised as directed below. Patient is asking what can be causing this and is there any side effect that can come from the calcium from being high

## 2018-09-05 NOTE — Telephone Encounter (Signed)
Patient was advised.  

## 2018-09-05 NOTE — Telephone Encounter (Signed)
LVMTRC 

## 2018-09-07 ENCOUNTER — Telehealth: Payer: Self-pay

## 2018-09-07 DIAGNOSIS — M545 Low back pain, unspecified: Secondary | ICD-10-CM

## 2018-09-07 MED ORDER — MELOXICAM 15 MG PO TABS: 15.0000 mg | ORAL_TABLET | Freq: Every day | ORAL | 0 refills | Status: DC

## 2018-09-07 NOTE — Telephone Encounter (Signed)
Sent in meloxicam

## 2018-09-07 NOTE — Telephone Encounter (Signed)
Patient states that she is having aggravated lower back pain. She has been seeing the chiropractor but cannot afford the visits. She is requesting some medication. Please advise

## 2018-09-07 NOTE — Telephone Encounter (Signed)
Patient was advised.  

## 2018-10-04 ENCOUNTER — Other Ambulatory Visit: Payer: Self-pay | Admitting: Physician Assistant

## 2018-10-04 DIAGNOSIS — M545 Low back pain, unspecified: Secondary | ICD-10-CM

## 2018-10-04 NOTE — Telephone Encounter (Signed)
Tami Williams's patient L.O.V. 06/15/2018, please advise.

## 2018-10-11 ENCOUNTER — Ambulatory Visit: Payer: BC Managed Care – PPO | Admitting: Physician Assistant

## 2018-10-15 ENCOUNTER — Encounter: Payer: Self-pay | Admitting: Physician Assistant

## 2018-10-15 ENCOUNTER — Ambulatory Visit: Payer: BC Managed Care – PPO | Admitting: Physician Assistant

## 2018-10-15 ENCOUNTER — Other Ambulatory Visit: Payer: Self-pay

## 2018-10-15 VITALS — BP 129/84 | HR 73 | Temp 98.4°F | Wt 133.0 lb

## 2018-10-15 DIAGNOSIS — G479 Sleep disorder, unspecified: Secondary | ICD-10-CM | POA: Diagnosis not present

## 2018-10-15 DIAGNOSIS — M62838 Other muscle spasm: Secondary | ICD-10-CM | POA: Diagnosis not present

## 2018-10-15 DIAGNOSIS — M25551 Pain in right hip: Secondary | ICD-10-CM

## 2018-10-15 DIAGNOSIS — S39012A Strain of muscle, fascia and tendon of lower back, initial encounter: Secondary | ICD-10-CM

## 2018-10-15 MED ORDER — METHYLPREDNISOLONE 4 MG PO TBPK: ORAL_TABLET | ORAL | 0 refills | Status: DC

## 2018-10-15 MED ORDER — BACLOFEN 10 MG PO TABS: 10.0000 mg | ORAL_TABLET | Freq: Three times a day (TID) | ORAL | 0 refills | Status: DC

## 2018-10-15 MED ORDER — TRAZODONE HCL 50 MG PO TABS: 50.0000 mg | ORAL_TABLET | Freq: Every evening | ORAL | 1 refills | Status: DC | PRN

## 2018-10-15 NOTE — Progress Notes (Signed)
Patient: Tami Williams Female    DOB: 1965-10-26   53 y.o.   MRN: [id number] Visit Date: 10/15/2018  Today's Provider: Margaretann Loveless, PA-C   Chief Complaint  Patient presents with  . Back Pain  . Insomnia   Subjective:     HPI  Patient reports trouble sleeping. Patient reports that she can not stay asleep. Patient reports she is having some family health problems. Patient reports that she has used several otc medications. Patient reports symptoms have been on going for the last 2 months. Patient reports taking trazodone that belongs to her husband.   Back pain- patient c/o back pain on going for several months. Patient reports back pain radiates to right hip. Patient reports daily activities make pain worse. Patient reports pain is at middle lower back and radiates to right hip. Patient started Meloxicam yesterday.   Allergies  Allergen Reactions  . No Known Allergies      Current Outpatient Medications:  .  acetaminophen (TYLENOL) 500 MG tablet, Take 500 mg by mouth every 6 (six) hours as needed., Disp: , Rfl:  .  fluticasone (FLONASE) 50 MCG/ACT nasal spray, Place 2 sprays into both nostrils as needed. , Disp: , Rfl:  .  lisinopril (PRINIVIL,ZESTRIL) 10 MG tablet, TAKE 1 TABLET(10 MG) BY MOUTH DAILY, Disp: 90 tablet, Rfl: 1 .  meloxicam (MOBIC) 15 MG tablet, TAKE 1 TABLET(15 MG) BY MOUTH DAILY, Disp: 30 tablet, Rfl: 0 .  mupirocin ointment (BACTROBAN) 2 %, Apply to wound after soaking BID, Disp: 30 g, Rfl: 1 .  ondansetron (ZOFRAN) 4 MG tablet, Take 1 tablet (4 mg total) by mouth every 8 (eight) hours as needed for nausea or vomiting., Disp: 20 tablet, Rfl: 1 .  ondansetron (ZOFRAN-ODT) 4 MG disintegrating tablet, Take by mouth., Disp: , Rfl:  .  pantoprazole (PROTONIX) 40 MG tablet, Take 1 tablet (40 mg total) by mouth daily., Disp: 90 tablet, Rfl: 3 .  SYNTHROID 50 MCG tablet, TK 1 T PO QD, Disp: , Rfl: 6  Review of Systems  Constitutional: Negative.    Respiratory: Negative.   Cardiovascular: Negative.   Gastrointestinal: Negative for abdominal pain and nausea.  Musculoskeletal: Positive for back pain and myalgias.  Neurological: Negative for weakness and numbness.  Psychiatric/Behavioral: Positive for sleep disturbance.    Social History   Tobacco Use  . Smoking status: Never Smoker  . Smokeless tobacco: Never Used  Substance Use Topics  . Alcohol use: Yes    Alcohol/week: 0.0 standard drinks    Comment: occasional      Objective:   BP 129/84 (BP Location: Left Arm, Patient Position: Sitting, Cuff Size: Normal)   Pulse 73   Temp 98.4 F (36.9 C) (Oral)   Wt 133 lb (60.3 kg)   BMI 22.83 kg/m  Vitals:   10/15/18 1615  BP: 129/84  Pulse: 73  Temp: 98.4 F (36.9 C)  TempSrc: Oral  Weight: 133 lb (60.3 kg)     Physical Exam Vitals signs reviewed.  Constitutional:      General: She is not in acute distress.    Appearance: Normal appearance. She is well-developed and normal weight. She is not ill-appearing or diaphoretic.  Neck:     Musculoskeletal: Normal range of motion and neck supple.     Thyroid: No thyromegaly.     Vascular: No JVD.     Trachea: No tracheal deviation.  Cardiovascular:     Rate and Rhythm: Normal rate  and regular rhythm.     Heart sounds: Normal heart sounds. No murmur. No friction rub. No gallop.   Pulmonary:     Effort: Pulmonary effort is normal. No respiratory distress.     Breath sounds: Normal breath sounds. No wheezing or rales.  Musculoskeletal:     Lumbar back: She exhibits decreased range of motion (forward flexion and extension), tenderness and spasm. She exhibits no bony tenderness and normal pulse.  Lymphadenopathy:     Cervical: No cervical adenopathy.  Neurological:     Mental Status: She is alert.  Psychiatric:        Mood and Affect: Mood normal.        Behavior: Behavior normal.        Thought Content: Thought content normal.        Judgment: Judgment normal.          Assessment & Plan    1. Difficulty sleeping Will start trazodone as below since she has had two tabs over the last two nights that has helped her sleep. She is to call if symptoms worsen or medication becomes ineffective.  - traZODone (DESYREL) 50 MG tablet; Take 1 tablet (50 mg total) by mouth at bedtime as needed for sleep.  Dispense: 90 tablet; Refill: 1  2. Muscle spasm Tightness in the musculature on the right side low back just above the iliac crest. Will give medrol dose pak and baclofen as below. Hold meloxicam until medrol completed. Back exercises printed for patient. Call if worsening and will get imaging of back and right hip.  - methylPREDNISolone (MEDROL) 4 MG TBPK tablet; 6 day taper; take as directed on package instructions  Dispense: 21 tablet; Refill: 0 - baclofen (LIORESAL) 10 MG tablet; Take 1 tablet (10 mg total) by mouth 3 (three) times daily.  Dispense: 30 each; Refill: 0  3. Strain of lumbar region, initial encounter See above medical treatment plan. - methylPREDNISolone (MEDROL) 4 MG TBPK tablet; 6 day taper; take as directed on package instructions  Dispense: 21 tablet; Refill: 0  4. Right hip pain Possibly some associated bursitis vs radiculopathy. States "feels like a bruise on the outside of the right hip, like after you run into a desk." Will treat as noted above under #2 and will get imaging of right hip to r/o OA if not improving.  - methylPREDNISolone (MEDROL) 4 MG TBPK tablet; 6 day taper; take as directed on package instructions  Dispense: 21 tablet; Refill: 0     Margaretann LovelessJennifer M Burnette, PA-C  [facility]

## 2018-10-15 NOTE — Patient Instructions (Signed)

## 2018-10-22 ENCOUNTER — Other Ambulatory Visit: Payer: Self-pay | Admitting: Physician Assistant

## 2018-10-22 DIAGNOSIS — M62838 Other muscle spasm: Secondary | ICD-10-CM

## 2018-10-23 ENCOUNTER — Other Ambulatory Visit: Payer: Self-pay | Admitting: Physician Assistant

## 2018-10-23 DIAGNOSIS — I10 Essential (primary) hypertension: Secondary | ICD-10-CM

## 2018-10-24 ENCOUNTER — Ambulatory Visit
Admission: RE | Admit: 2018-10-24 | Discharge: 2018-10-24 | Disposition: A | Discharge status: Home / Self Care | Payer: BC Managed Care – PPO | Source: Ambulatory Visit | Attending: Physician Assistant | Admitting: Physician Assistant

## 2018-10-24 ENCOUNTER — Other Ambulatory Visit: Payer: Self-pay

## 2018-10-24 ENCOUNTER — Telehealth: Payer: Self-pay

## 2018-10-24 DIAGNOSIS — M5441 Lumbago with sciatica, right side: Secondary | ICD-10-CM

## 2018-10-24 NOTE — Telephone Encounter (Signed)
Patient states that she finished the Prednisone 2 days ago but is still having lower back pain that is radiating to the right hip. Please advise

## 2018-10-24 NOTE — Telephone Encounter (Signed)
Patient advised as below. Patient verbalizes understanding and is in agreement with treatment plan.  

## 2018-10-24 NOTE — Telephone Encounter (Signed)
Please advise 

## 2018-10-24 NOTE — Telephone Encounter (Signed)
I will order xray of lumbar spine and right hip as discussed. Will f/u once I receive results and decide what is best to do next.

## 2018-10-25 ENCOUNTER — Telehealth: Payer: Self-pay

## 2018-10-25 ENCOUNTER — Other Ambulatory Visit: Payer: Self-pay | Admitting: Physician Assistant

## 2018-10-25 DIAGNOSIS — M5431 Sciatica, right side: Secondary | ICD-10-CM

## 2018-10-25 MED ORDER — CYCLOBENZAPRINE HCL 5 MG PO TABS: 5.0000 mg | ORAL_TABLET | Freq: Three times a day (TID) | ORAL | 1 refills | Status: DC | PRN

## 2018-10-25 NOTE — Telephone Encounter (Signed)
Patient advised as directed below. Per patient she would like that other muscle relaxer to be send in. She is also asking if a referral for PT can be place to see if this can help her? She reports that she has done the Chiropractor and feels she needs something different.

## 2018-10-25 NOTE — Telephone Encounter (Signed)
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 Also noted to have moderate stool burden (constipation). The constipation can exacerbate the back pain. May benefit from Miralax one capful daily then adjust dosing pending consistency of the bowel movements. I do think the pain in the back and right side is more sciatic related. We can try a stronger muscle relaxer than baclofen and continue to try to massage the area to relieve pressure off the sciatic nerve. You can look up deep tissue and trigger point therapy for sciatica and try those. Heating pad as well and epsom salt soaks in the bath.

## 2018-10-25 NOTE — Progress Notes (Signed)
Changed baclofen to cyclobenzaprine due to sciatica right side not responding to baclofen

## 2018-10-25 NOTE — Telephone Encounter (Signed)
PT ordered and cyclobenzaprine sent in

## 2018-10-30 ENCOUNTER — Other Ambulatory Visit: Payer: Self-pay

## 2018-10-30 ENCOUNTER — Ambulatory Visit: Payer: BC Managed Care – PPO | Attending: Physician Assistant | Admitting: Physical Therapy

## 2018-10-30 ENCOUNTER — Encounter: Payer: Self-pay | Admitting: Physical Therapy

## 2018-10-30 ENCOUNTER — Telehealth: Payer: Self-pay | Admitting: Physician Assistant

## 2018-10-30 DIAGNOSIS — M5441 Lumbago with sciatica, right side: Secondary | ICD-10-CM | POA: Diagnosis not present

## 2018-10-30 DIAGNOSIS — R262 Difficulty in walking, not elsewhere classified: Secondary | ICD-10-CM | POA: Diagnosis present

## 2018-10-30 NOTE — Telephone Encounter (Signed)
Can we switch to [employer]?

## 2018-10-30 NOTE — Therapy (Signed)
[facility] [address] [place], [address] Phone: [phone]   Fax:  [phone]  Physical Therapy Evaluation  Patient Details  Name: Tami Williams MRN: [id number] Date of Birth: [birth year] Referring Provider (PT): Mar Daring, [place]   Encounter Date: 10/30/2018  PT End of Session - 10/30/18 1332    Visit Number  1    Number of Visits  12    Date for PT Re-Evaluation  12/12/18    Authorization Type  [employer] reporting period from 10/30/2018    Authorization Time Period  Current cert period: 9/76/7341 - 12/12/2018 (latest PN: IE 10/30/2018)    Authorization - Visit Number  1    Authorization - Number of Visits  10    PT Start Time  0930    PT Stop Time  1030    PT Time Calculation (min)  60 min    Activity Tolerance  Patient tolerated treatment well;Patient limited by pain    Behavior During Therapy  [facility] for tasks assessed/performed;Agitated   became agitated during subjective exam when she could not see relevance; calmed down as exam proceeded      Past Medical History:  Diagnosis Date  . Acute epigastric pain   . GAD (generalized anxiety disorder)   . GERD (gastroesophageal reflux disease)   . Hypertension   . Non-cardiac chest pain     Past Surgical History:  Procedure Laterality Date  . [place] STUDY N/A 01/06/2016   Procedure: [place] STUDY;  Surgeon: Lucilla Lame, MD;  Location: [facility] ENDOSCOPY;  Service: Endoscopy;  Laterality: N/A;  . ABDOMINAL HYSTERECTOMY     due to endometriosis  . ESOPHAGEAL MANOMETRY N/A 01/06/2016   Procedure: ESOPHAGEAL MANOMETRY (EM);  Surgeon: Lucilla Lame, MD;  Location: [facility] ENDOSCOPY;  Service: Endoscopy;  Laterality: N/A;  . ESOPHAGOGASTRODUODENOSCOPY    . ESOPHAGOGASTRODUODENOSCOPY (EGD) WITH PROPOFOL N/A 12/02/2015   Procedure: ESOPHAGOGASTRODUODENOSCOPY (EGD) WITH PROPOFOL;  Surgeon: Manya Silvas, MD;  Location: [facility] ENDOSCOPY;  Service: Endoscopy;   Laterality: N/A;  . FOOT SURGERY Left 04/2018  . [place] FUNDOPLICATION  [id number]   dr. Tamala Julian    There were no vitals filed for this visit.   Subjective Assessment - 10/30/18 1325    Subjective  Patient reports she has a deteriorating disc is what she has been told, starting in 2014. She lifted up a cooler of deer meat from car. It was heavier than usual. She picked it up with both hands and twisted really quick and put it down. It aggravated her back (no leg pain). She took muscle relaxer and steroids and it went away for months. Then she noticed she had some pain in her back when she lifted heavy things. She could pamper it a bit and it would go away. Any time it hurt a steroid would take it way. Nothing has put her out like her current pain that started about 1 month ago. She didn't do anything unusual, just usual grocery shopping picking up case of water and mopping floor. When it starts showing its face, even something like mopping. She has too much going on and she is too active to live like this. Her husband just had knee replacement surgery and father has hospice coming in July 1 due to lung cancer. He has a birthday party coming up this Saturday. She lives with her husband and father. She did 2 rounds of steroids but that didn't touch it.  She has been to 6 chiropractic treatments without any improvements (rolling thing on the bed, and popping, no exercises) Also had new muscle relaxer and a new medication that she only takes at night because she thinks it might make her sleepy. She also tried meloxicam without any effect.    Pertinent History  Patient is a 53 y.o. female who presents to outpatient physical therapy with a referral for medical diagnosis right sided sciatica. This patient's chief complaints consist of constant low back pain with intermittent radiation to the right iliac crest region with insidious onset approximately 1 month ago leading to the following functional deficits: difficulty  with all aspects of life due to pain including reduced sleep, difficulty bending, lifting, twisting, squatting, caring for husband and terminally ill father, prolonged sitting, prolonged standing, driving, getting out of bed and up from chair, and laying prone and on R side. She has a history of intermittent episodes of low back pain since 2014 but none have been this severe and all have readily responded to steroid Dosepak and temporary rest. Relevant past medical history and comorbidities include history of hiatial hernia (repaired), HTN, cholecystectomy, hysterectomy, L foot surgery in 04/2018 (bunionectomy - recovered), anxiety, hypothyroidism.    Limitations  Sitting;House hold activities;Reading;Lifting;Standing;Other (comment)   difficulty with all aspects of life due to pain including reduced sleep, difficulty bending, lifting, twisting, squatting, caring for husband and terminally ill father, prolonged sitting, standing, driving, getting out of bed/chair, laying prone,R side.   How long can you sit comfortably?  2-3 hours in recliner but hard to get up after, < 5 min otherwise    How long can you stand comfortably?  never stands still    How long can you walk comfortably?  walking doesn't cause more pain or relieve pain    Diagnostic tests  lumbar radiograph report 10/24/2018: "Mild degenerative disc disease." L hip radiograph 10/24/2018 negative.    Patient Stated Goals  to get out of pain    Currently in Pain?  Yes    Pain Score  10-Worst pain ever   best 3/10 ("perfect position", at worst 10/10 (when gets out of bed).   Pain Location  Back   Currently pain at R low back to top of R iliac crest (also furthest extent of symptoms).   Pain Orientation  Right    Pain Descriptors / Indicators  --   "locks" up, "hurts."   Pain Type  Acute pain    Pain Onset  1 to 4 weeks ago    Aggravating Factors   mopping or anything with 45 degree bend, cannot lift even in water, prolonged sitting (2-3 hours  in recliner but hard to get up after, < 5 min), sleeping on stomach, sleeping on R side, ice, standing in one spot for long periods (unknown how long), driving (especially turing to see if something is coming), getting up in morning.    Pain Relieving Factors  heat (temporary relief, still stiff when she gets up), better "on the move" generally, walking doesn't make worse, can sleep on back and L side, sitting in recliner, or going to bedroom to get in bed with head up, not moving, can find perfect    Effect of Pain on Daily Activities  difficulty with all aspects of life due to pain including reduced sleep, difficulty bending, lifting, twisting, squatting, caring for husband and terminally ill father, prolonged sitting, prolonged standing, driving, getting out of bed and up from chair, and laying prone  and on R side.24-hour pattern: evening is better unless she has done something over the day to agg.       SPECIAL SCREENING QUESTIONS Dizziness, double vision, difficulty swallowing, difficulty speaking, fainting spells or blackouts, facial numbness, or nausea: denies Recent illness or fever: denies Recent unexplained weight loss: denies Night pain/sweats: denies, confirm night pains.  Recent bowel or bladder function abnormality: denies Current symptoms/concerns not elsewhere described: denies  [facility] PT [facility] - 10/31/18 0001      Assessment   Medical Diagnosis  right sided sciatica    Referring Provider (PT)  Margaretann LovelessBurnette, Jennifer M, PA-C    Onset Date/Surgical Date  09/29/18    Prior Therapy  chiropractic for 6 visits (manip, rolling) with no change      Precautions   Precautions  None      Restrictions   Weight Bearing Restrictions  No      Balance Screen   Has the patient fallen in the past 6 months  No      Home Environment   Living Environment  Private residence    Living Arrangements  Spouse/significant other;Other relatives      Prior Function   Level of Independence   Independent   provides care for husband and father   Vocation  Full time employment    Engineer, 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      Leisure  caretaking for dad and husband; gets in the pool.       Cognition   Overall Cognitive Status  Within Functional Limits for tasks assessed      Observation/Other Assessments   Observations  see note from 10/30/2018 for latest objective data    Focus on Therapeutic Outcomes (FOTO)   FOTO = 35 (10/30/2018);        [facility] PT Assessment - 10/31/18 0001      Assessment   Medical Diagnosis  right sided sciatica    Referring Provider (PT)  Margaretann LovelessBurnette, Jennifer M, PA-C    Onset Date/Surgical Date  09/29/18    Prior Therapy  chiropractic for 6 visits (manip, rolling) with no change      Precautions   Precautions  None      Restrictions   Weight Bearing Restrictions  No      Balance Screen   Has the patient fallen in the past 6 months  No      Home Environment   Living Environment  Private residence    Living Arrangements  Spouse/significant other;Other relatives      Prior Function   Level of Independence  Independent   provides care for husband and father   Vocation  Full time employment    Engineer, 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      Leisure  caretaking for dad and husband; gets in the pool.       Cognition   Overall Cognitive Status  Within Functional Limits for tasks assessed      Observation/Other Assessments   Observations  see note from 10/30/2018 for latest objective data    Focus on Therapeutic Outcomes (FOTO)   FOTO = 35 (10/30/2018);         OBJECTIVE: OBSERVATION/INSPECTION: Patient presents with appropriate muscle mass throughout body, appropriate body composition, no lateral shift or obvious spinal deformity or variation. Marland Kitchen.  NEUROLOGICAL: Dermatomes: BLE dermatomes equal and intact to light touch. Myotomes: BLE intact grossly 5/5  SPINE  MOTION Lumbar AROM:  *Indicates pain - Flexion: = 100% but very painful while going down, PDM - Extension: = 50%, central pain during entire motion. - Rotation: R = 75% pain at R low back, L = pain at R low back. - Side Flexion: R = 100% painful!, L = 100% painful - Side glide: R = 75% painful to hip!, L = 75% hurts.  PERIPHERAL JOINT MOTION (AROM/PROM in degrees):  *Indicates pain - BLE grossly WFL for basic mobility.   STRENGTH:  *Indicates pain - BLE grossly 5/5. Did not specifically check hip abduction, hip ER/IR, or hip extension at this visit but adequate for basic functional mobility. Deferred more in depth strength assessment for later date if warranted.   REPEATED MOTIONS TESTING:  Repeated lumbar extension in standing x 10: during = increased central pain 10/10, ERP radiating to R iliac crest; after = central pain stayed higher, no longer peripheral pain.   Prone laying x 1 min: during = slight decrease to 6 centrally, with mild ache in R iliac crest region not changing; after =   Prone laying x 1 min: during = stays 5/6 centrally, pain in R iliac crest abolishing;   Prone on elbows x 3 minutes: during = staying 5/6;   Prone press up x10 during = central pain; after = no better/worse  Prone press up with sag during = central pain, possibly some felt towards R iliac crest; after = painful to bend forward but able to do so, states transferring prone to standing is just as hard as previously but did move pretty well despite obvious discomfort.   Prone press up with hips off center to left x 10 during = ERP and referral to R iliac crest; after = no worse  ACCESSORY MOTION:  - Very tender to CPA at lowest lumbar segments and sacrum. Worse the more caudal the CPA. - Not TTP To R or L UPA in lumbar spine.  PALPATION: - TTP throughout R glute, over SIJ and sacrum - Less tenderness than expected along lumbar paraspinals  FUNCTIONAL MOBILITY: - Bed mobility: sit <> prone  independent with mild-moderate difficulty due to pain - Transfers: sit <> stand independent, slow due to discomfort - Gait: ambulates household and short community distances with gait pattern WFL, reports 5-6/10 pain.   EDUCATION/COGNITION: Patient is alert and oriented X 4.  Objective measurements completed on examination: See above findings.     TREATMENT:   Therapeutic exercise: to centralize symptoms and improve ROM, strength, muscular endurance, and activity tolerance required for successful completion of functional activities.   Repeated lumbar extension in standing x 10: during = increased central pain 10/10, ERP radiating to R iliac crest; after = central pain stayed higher, no longer peripheral pain.   Prone laying x 1 min: during = slight decrease to 6 centrally, with mild ache in R iliac crest region not changing; after =   Prone laying x 1 min: during = stays 5/6 centrally, pain in R iliac crest abolishing;   Prone on elbows x 3 minutes: during = staying 5/6;   Prone press up x10 during = central pain; after = no better/worse  Prone press up with sag during = central pain, possibly some felt towards R iliac crest; after = painful to bend forward but able to do so, states transferring prone to standing is just as hard as previously but did move pretty well despite obvious discomfort.   Prone press up with hips off center  to left x 10 during = ERP and referral to R iliac crest; after = no worse  Education on diagnosis, prognosis, POC, anatomy and physiology of current condition.   Education on HEP including handout   Education and trial of seated posture with lumbar roll  HOME EXERCISE PROGRAM Access Code: [email or web address]  URL: [email or web address]  Date: 10/30/2018  Prepared by: Norton BlizzardSara Samanatha Brammer   Exercises  Seated Correct Posture  Prone Press Up - 10-15 reps - 1 second hold - 6x daily    Patient response to treatment:  Pt tolerated treatment well. Pt was  able to complete all exercises with minimal to no lasting increase in pain or discomfort. Pt required multimodal cuing for proper technique and to facilitate improved neuromuscular control, strength, range of motion, and functional ability resulting in improved performance and form.    PT Education - 10/30/18 1332    Education Details  Exercise purpose/form. Self management techniques. Education on diagnosis, prognosis, POC, anatomy and physiology of current condition Education on HEP including handout    Person(s) Educated  Patient    Methods  Explanation;Demonstration;Tactile cues;Verbal cues;Handout    Comprehension  Verbalized understanding;Returned demonstration;Verbal cues required;Tactile cues required       PT Short Term Goals - 10/31/18 1846      PT SHORT TERM GOAL #1   Title  Be independent with initial home exercise program for self-management of symptoms.    Baseline  Initial HEP provided at IE (10/30/2018);    Time  2    Period  Weeks    Status  New    Target Date  11/14/18        PT Long Term Goals - 10/31/18 1847      PT LONG TERM GOAL #1   Title  Be independent with a long-term home exercise program for self-management of symptoms.    Baseline  Initial HEP provided at IE (10/30/2018);    Time  6    Period  Weeks    Status  New    Target Date  12/12/18      PT LONG TERM GOAL #2   Title  Demonstrate improved FOTO score by 10 units to demonstrate improvement in overall condition and self-reported functional ability.    Baseline  FOTO = 35 (10/30/2018);    Time  6    Period  Weeks    Status  New    Target Date  12/12/18      PT LONG TERM GOAL #3   Title  Have full lumbar spine AROM with no compensations or increase in pain in all planes except intermittent end range discomfort to allow patient to complete valued activities with less difficulty.    Baseline  see objective exam (10/30/2018);    Time  6    Period  Weeks    Status  New    Target Date  12/12/18       PT LONG TERM GOAL #4   Title  Be able to demonstrate floor to waist lift with proper form without limitation due to current condition in order to improve ability to lift during usual activities.    Baseline  sit <> stand very slow and painful - deferred lifting testing due to difficulty with sit <> stand (10/30/2018);    Time  6    Period  Weeks    Status  New    Target Date  12/12/18      PT LONG TERM GOAL #5  Title  Complete community, work and/or recreational activities without limitation due to current condition.    Baseline  difficulty sleeping, bending, lifting, twisting, squatting, caring for husband and temrinally ill father, prolonged sitting and standing, driving, getting out of bed and up from chair (10/30/2018);    Time  6    Period  Weeks    Status  New    Target Date  12/12/18       Plan - 10/30/18 1337    Clinical Impression Statement  Patient is a 53 y.o. 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 Patient presents with significant pain, ROM, stiffness, weakness, activity tolerance impairments that are limiting ability to complete usual activities including difficulty bending, lifting, twisting, squatting, caring for husband and terminally ill father, prolonged sitting, prolonged standing, driving, getting out of bed and up from chair, and laying prone and on R side. Patient will benefit from skilled physical therapy intervention to address current body structure impairments and activity limitations to improve function and work towards goals set in current POC in order to return to prior level of function or maximal functional improvement    Personal Factors and Comorbidities  Comorbidity 3+    Comorbidities  history of hiatial hernia  (repaired), HTN, cholecystectomy, hysterectomy, L foot surgery in 04/2018 (bunionectomy - recovered), anxiety, hypothyroidism.    Examination-Activity Limitations  Bathing;Hygiene/Grooming;Squat;Bed Mobility;Lift;Bend;Stand;Caring for Others;Toileting;Carry;Transfers;Sit;Sleep;Dressing;Other   difficulty with  sleep, difficulty bending, lifting, twisting, squatting, caring for husband and terminally ill father, prolonged sitting, prolonged standing, driving, getting out of bed and up from chair, and laying prone and on R side.   Examination-Participation Restrictions  Laundry;Shop;Cleaning;Community Activity;Meal Prep;Yard Work;Driving;Interpersonal Relationship   caring for husband and father   Stability/Clinical Decision Making  Evolving/Moderate complexity    Clinical Decision Making  Moderate    Rehab Potential  Good    PT Frequency  2x / week    PT Duration  6 weeks    PT Treatment/Interventions  ADLs/Self Care Home Management;Cryotherapy;Moist Heat;Electrical Stimulation;Therapeutic activities;Therapeutic exercise;Neuromuscular re-education;Patient/family education;Manual techniques;Passive range of motion;Dry needling;Spinal Manipulations;Joint Manipulations;Other (comment)   joint mobilizations grades I-IV   PT Next Visit Plan  Access Code: [email or web address]    Consulted and Agree with Plan of Care  Patient       Patient will benefit from skilled therapeutic intervention in order to improve the following deficits and impairments:  Pain, Decreased mobility, Increased muscle spasms, Decreased activity tolerance, Decreased endurance, Decreased range of motion, Impaired perceived functional ability  Visit Diagnosis: 1. Acute right-sided low back pain with right-sided sciatica   2. Difficulty in walking, not elsewhere classified        Problem List Patient Active Problem List   Diagnosis Date Noted  . Biliary dyskinesia 12/07/2016  . Perimenopausal symptoms 09/13/2016  . Allergic rhinitis  03/20/2015  . Cold sore 03/20/2015  . Abnormal liver enzymes 03/20/2015  . 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 Hypertension 03/20/2015  . Diaphragmatic hernia 03/20/2015  . 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 Anxiety disorder 03/20/2015  . Acid reflux 09/23/2007  . Gastro-esophageal reflux disease without esophagitis 09/23/2007    Luretha Murphy. Ilsa Iha, PT, DPT 10/31/18, 6:55 PM  [facility] [address], [place], [address] Phone: [phone]  Fax:  [phone]  Name: ANIRA SENEGAL MRN: [id number] Date of Birth: [birth year]

## 2018-10-30 NOTE — Telephone Encounter (Signed)
Pt went today for her first visit to [facility]  For PT and she was not pleased and would like to go to [facility].  CB#  [phone]  thanks teri

## 2018-10-31 ENCOUNTER — Ambulatory Visit: Payer: BC Managed Care – PPO | Admitting: Physical Therapy

## 2018-10-31 ENCOUNTER — Encounter: Payer: Self-pay | Admitting: Physical Therapy

## 2018-10-31 DIAGNOSIS — R262 Difficulty in walking, not elsewhere classified: Secondary | ICD-10-CM

## 2018-10-31 DIAGNOSIS — M5441 Lumbago with sciatica, right side: Secondary | ICD-10-CM

## 2018-10-31 NOTE — Telephone Encounter (Signed)
Order faxed to [facility].Pt advised their office will contact her with appointment.Their contact information was provided to her

## 2018-10-31 NOTE — Therapy (Signed)
[facility] [address] [place], [address] Phone: [phone]   Fax:  [phone]  Physical Therapy Treatment  Patient Details  Name: Tami Williams MRN: [id number] Date of Birth: [birth year] Referring Provider (PT): Mar Daring, [place]   Encounter Date: 10/31/2018    Past Medical History:  Diagnosis Date  . Acute epigastric pain   . GAD (generalized anxiety disorder)   . GERD (gastroesophageal reflux disease)   . Hypertension   . Non-cardiac chest pain     Past Surgical History:  Procedure Laterality Date  . [place] STUDY N/A 01/06/2016   Procedure: [place] STUDY;  Surgeon: Lucilla Lame, MD;  Location: [facility] ENDOSCOPY;  Service: Endoscopy;  Laterality: N/A;  . ABDOMINAL HYSTERECTOMY     due to endometriosis  . ESOPHAGEAL MANOMETRY N/A 01/06/2016   Procedure: ESOPHAGEAL MANOMETRY (EM);  Surgeon: Lucilla Lame, MD;  Location: [facility] ENDOSCOPY;  Service: Endoscopy;  Laterality: N/A;  . ESOPHAGOGASTRODUODENOSCOPY    . ESOPHAGOGASTRODUODENOSCOPY (EGD) WITH PROPOFOL N/A 12/02/2015   Procedure: ESOPHAGOGASTRODUODENOSCOPY (EGD) WITH PROPOFOL;  Surgeon: Manya Silvas, MD;  Location: [facility] ENDOSCOPY;  Service: Endoscopy;  Laterality: N/A;  . FOOT SURGERY Left 04/2018  . NISSEN FUNDOPLICATION  [id number]   dr. Tamala Julian    There were no vitals filed for this visit.  Subjective Assessment - 10/31/18 1857    Subjective  Patient called clinic front office and requested self-discharge stating she was now planning to got to [facility] Physical Therapy    Pertinent History  Patient is a 53 y.o. female who presents to outpatient physical therapy with a referral for medical diagnosis right sided sciatica. This patient's chief complaints consist of constant low back pain with intermittent radiation to the right iliac crest region with insidious onset approximately 1 month ago leading to the following functional deficits:  difficulty with all aspects of life due to pain including reduced sleep, difficulty bending, lifting, twisting, squatting, caring for husband and terminally ill father, prolonged sitting, prolonged standing, driving, getting out of bed and up from chair, and laying prone and on R side. She has a history of intermittent episodes of low back pain since 2014 but none have been this severe and all have readily responded to steroid Dosepak and temporary rest. Relevant past medical history and comorbidities include history of hiatial hernia (repaired), HTN, cholecystectomy, hysterectomy, L foot surgery in 04/2018 (bunionectomy - recovered), anxiety, hypothyroidism.    Limitations  Sitting;House hold activities;Reading;Lifting;Standing;Other (comment)   difficulty with all aspects of life due to pain including reduced sleep, difficulty bending, lifting, twisting, squatting, caring for husband and terminally ill father, prolonged sitting, standing, driving, getting out of bed/chair, laying prone,R side.   How long can you sit comfortably?  2-3 hours in recliner but hard to get up after, < 5 min otherwise    How long can you stand comfortably?  never stands still    How long can you walk comfortably?  walking doesn't cause more pain or relieve pain    Diagnostic tests  lumbar radiograph report 10/24/2018: "Mild degenerative disc disease." L hip radiograph 10/24/2018 negative.    Patient Stated Goals  to get out of pain    Pain Onset  1 to 4 weeks ago         OBJECTIVE:  Patient not present for examination. Please see previous documentation for latest objective data.     PT Short Term Goals - 10/31/18 1859  PT SHORT TERM GOAL #1   Title  Be independent with initial home exercise program for self-management of symptoms.    Baseline  Initial HEP provided at IE (10/30/2018); pt self-discharged (10/31/2018)    Time  2    Period  Weeks    Status  Not Met    Target Date  11/14/18        PT Long Term  Goals - 10/31/18 1859      PT LONG TERM GOAL #1   Title  Be independent with a long-term home exercise program for self-management of symptoms.    Baseline  Initial HEP provided at IE (10/30/2018); pt self-discharged (10/31/2018);    Time  6    Period  Weeks    Status  Not Met    Target Date  12/12/18      PT LONG TERM GOAL #2   Title  Demonstrate improved FOTO score by 10 units to demonstrate improvement in overall condition and self-reported functional ability.    Baseline  FOTO = 35 (10/30/2018); pt self-discharged (10/31/2018);    Time  6    Period  Weeks    Status  Not Met    Target Date  12/12/18      PT LONG TERM GOAL #3   Title  Have full lumbar spine AROM with no compensations or increase in pain in all planes except intermittent end range discomfort to allow patient to complete valued activities with less difficulty.    Baseline  see objective exam (10/30/2018); pt self-discharged (10/31/2018);    Time  6    Period  Weeks    Status  Not Met    Target Date  12/12/18      PT LONG TERM GOAL #4   Title  Be able to demonstrate floor to waist lift with proper form without limitation due to current condition in order to improve ability to lift during usual activities.    Baseline  sit <> stand very slow and painful - deferred lifting testing due to difficulty with sit <> stand (10/30/2018); pt self-discharged (10/31/2018);    Time  6    Period  Weeks    Status  New    Target Date  12/12/18      PT LONG TERM GOAL #5   Title  Complete community, work and/or recreational activities without limitation due to current condition.    Baseline  difficulty sleeping, bending, lifting, twisting, squatting, caring for husband and temrinally ill father, prolonged sitting and standing, driving, getting out of bed and up from chair (10/30/2018);pt self-discharged (10/31/2018);    Time  6    Period  Weeks    Status  Not Met    Target Date  12/12/18        Plan - 10/31/18 1902    Clinical  Impression Statement  Patient attended physical therapy initial evaluation only this episode of care before calling back to self-discharge and cancel all future appointments. Patient reported she decided to go to [facility] [place] instead of the present clinic. Patient did not meet any of her goals due to lack of participation. She is now discharged from physical therapy at this office due to self-discharge.    Personal Factors and Comorbidities  Comorbidity 3+    Comorbidities  history of hiatial hernia (repaired), HTN, cholecystectomy, hysterectomy, L foot surgery in 04/2018 (bunionectomy - recovered), anxiety, hypothyroidism.    Examination-Activity Limitations  Bathing;Hygiene/Grooming;Squat;Bed Mobility;Lift;Bend;Stand;Caring for Others;Toileting;Carry;Transfers;Sit;Sleep;Dressing;Other   difficulty with  sleep, difficulty bending, lifting, twisting, squatting, caring for husband and terminally ill father, prolonged sitting, prolonged standing, driving, getting out of bed and up from chair, and laying prone and on R side.   Examination-Participation Restrictions  Laundry;Shop;Cleaning;Community Activity;Meal Prep;Yard Work;Driving;Interpersonal Relationship   caring for husband and father   Stability/Clinical Decision Making  Evolving/Moderate complexity    Rehab Potential  Good    PT Frequency  2x / week    PT Duration  6 weeks    PT Treatment/Interventions  ADLs/Self Care Home Management;Cryotherapy;Moist Heat;Electrical Stimulation;Therapeutic activities;Therapeutic exercise;Neuromuscular re-education;Patient/family education;Manual techniques;Passive range of motion;Dry needling;Spinal Manipulations;Joint Manipulations;Other (comment)   joint mobilizations grades I-IV   PT Next Visit Plan  Patient is now discharged from physical therapy at this office due to self-discharge.    PT Home Exercise Plan  Medbridge Access Code: [email or web address]    Consulted and Agree with Plan of Care  Patient        Patient will benefit from skilled therapeutic intervention in order to improve the following deficits and impairments:  Pain, Decreased mobility, Increased muscle spasms, Decreased activity tolerance, Decreased endurance, Decreased range of motion, Impaired perceived functional ability  Visit Diagnosis: 1. Acute right-sided low back pain with right-sided sciatica   2. Difficulty in walking, not elsewhere classified        Problem List Patient Active Problem List   Diagnosis Date Noted  . Biliary dyskinesia 12/07/2016  . Perimenopausal symptoms 09/13/2016  . Allergic rhinitis 03/20/2015  . Cold sore 03/20/2015  . Abnormal liver enzymes 03/20/2015  . 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 Hypertension 03/20/2015  . Diaphragmatic hernia 03/20/2015  . 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 Anxiety disorder 03/20/2015  . Acid reflux 09/23/2007  . Gastro-esophageal reflux disease without esophagitis 09/23/2007    Everlean Alstrom. Graylon Good, PT, DPT 10/31/18, 7:03 PM  [facility] [facility] [address] [place], [address] Phone: [phone]   Fax:  [phone]  Name: Tami Williams MRN: [id number] Date of Birth: [birth year]

## 2018-11-01 ENCOUNTER — Encounter: Payer: BC Managed Care – PPO | Admitting: Physical Therapy

## 2018-11-05 ENCOUNTER — Ambulatory Visit: Payer: BC Managed Care – PPO | Admitting: Physical Therapy

## 2018-11-07 ENCOUNTER — Encounter: Payer: BC Managed Care – PPO | Admitting: Physical Therapy

## 2018-11-12 ENCOUNTER — Other Ambulatory Visit: Payer: Self-pay | Admitting: Physician Assistant

## 2018-11-12 DIAGNOSIS — M545 Low back pain, unspecified: Secondary | ICD-10-CM

## 2018-11-12 MED ORDER — MELOXICAM 15 MG PO TABS: ORAL_TABLET | ORAL | 1 refills | Status: DC

## 2018-11-12 NOTE — Telephone Encounter (Signed)
[facility] faxed refill request for the following medications:  meloxicam (MOBIC) 15 MG tablet   Please advise.  

## 2018-11-14 ENCOUNTER — Encounter: Payer: BC Managed Care – PPO | Admitting: Physical Therapy

## 2018-11-15 ENCOUNTER — Encounter: Payer: BC Managed Care – PPO | Admitting: Physical Therapy

## 2018-11-19 ENCOUNTER — Encounter: Payer: BC Managed Care – PPO | Admitting: Physical Therapy

## 2018-11-21 ENCOUNTER — Encounter: Payer: BC Managed Care – PPO | Admitting: Physical Therapy

## 2018-11-26 ENCOUNTER — Encounter: Payer: BC Managed Care – PPO | Admitting: Physical Therapy

## 2018-11-28 ENCOUNTER — Encounter: Payer: BC Managed Care – PPO | Admitting: Physical Therapy

## 2018-12-04 ENCOUNTER — Encounter: Payer: BC Managed Care – PPO | Admitting: Physical Therapy

## 2018-12-06 ENCOUNTER — Encounter: Payer: BC Managed Care – PPO | Admitting: Physical Therapy

## 2018-12-10 ENCOUNTER — Telehealth: Payer: Self-pay | Admitting: Physician Assistant

## 2018-12-10 DIAGNOSIS — M5441 Lumbago with sciatica, right side: Secondary | ICD-10-CM

## 2018-12-10 NOTE — Telephone Encounter (Signed)
Referral placed.

## 2018-12-10 NOTE — Telephone Encounter (Signed)
Pt would like a referral to orthopedic.  She has been going to PT for back pain.  She said they are not helping her and think she may need to see the orthopedic.  She said jenni told her she could do a referral if she needed to.  CB#  [phone]  teri

## 2018-12-11 ENCOUNTER — Encounter: Payer: BC Managed Care – PPO | Admitting: Physical Therapy

## 2018-12-11 ENCOUNTER — Other Ambulatory Visit: Payer: Self-pay | Admitting: Physician Assistant

## 2018-12-11 DIAGNOSIS — K449 Diaphragmatic hernia without obstruction or gangrene: Secondary | ICD-10-CM

## 2018-12-13 ENCOUNTER — Encounter: Payer: BC Managed Care – PPO | Admitting: Physical Therapy

## 2018-12-27 ENCOUNTER — Other Ambulatory Visit: Payer: Self-pay | Admitting: Physician Assistant

## 2018-12-27 DIAGNOSIS — Z1231 Encounter for screening mammogram for malignant neoplasm of breast: Secondary | ICD-10-CM

## 2019-02-04 ENCOUNTER — Ambulatory Visit
Admission: RE | Admit: 2019-02-04 | Discharge: 2019-02-04 | Disposition: A | Discharge status: Home / Self Care | Payer: BC Managed Care – PPO | Source: Ambulatory Visit | Attending: Physician Assistant | Admitting: Physician Assistant

## 2019-02-04 ENCOUNTER — Telehealth: Payer: Self-pay

## 2019-02-04 DIAGNOSIS — Z1231 Encounter for screening mammogram for malignant neoplasm of breast: Secondary | ICD-10-CM | POA: Insufficient documentation

## 2019-02-04 NOTE — Telephone Encounter (Signed)
Patient advised as directed below. 

## 2019-02-04 NOTE — Telephone Encounter (Signed)
-----   Message from Mar Daring, [place] sent at 02/04/2019  4:43 PM EDT ----- Normal mammogram. Repeat screening in one year.

## 2019-03-12 ENCOUNTER — Other Ambulatory Visit: Payer: Self-pay | Admitting: Physician Assistant

## 2019-03-12 DIAGNOSIS — G479 Sleep disorder, unspecified: Secondary | ICD-10-CM

## 2019-03-12 MED ORDER — TRAZODONE HCL 50 MG PO TABS: 50.0000 mg | ORAL_TABLET | Freq: Every evening | ORAL | 1 refills | Status: DC | PRN

## 2019-03-12 NOTE — Telephone Encounter (Signed)
Pt is having issues with her acid reflux again.  Asking if something stronger can be prescribed.   Also needing refills on traZODone (DESYREL) 50 MG tablet  Pt uses:  [facility] [place], [address] [place] [phone] (Phone) [phone] (Fax)   Thanks, [employer]

## 2019-03-19 ENCOUNTER — Telehealth (INDEPENDENT_AMBULATORY_CARE_PROVIDER_SITE_OTHER): Payer: BC Managed Care – PPO | Admitting: Family Medicine

## 2019-03-19 ENCOUNTER — Encounter: Payer: Self-pay | Admitting: Family Medicine

## 2019-03-19 ENCOUNTER — Other Ambulatory Visit: Payer: Self-pay

## 2019-03-19 VITALS — BP 135/100 | HR 66 | Wt 129.0 lb

## 2019-03-19 DIAGNOSIS — J3489 Other specified disorders of nose and nasal sinuses: Secondary | ICD-10-CM | POA: Diagnosis not present

## 2019-03-19 DIAGNOSIS — R05 Cough: Secondary | ICD-10-CM | POA: Diagnosis not present

## 2019-03-19 DIAGNOSIS — R0981 Nasal congestion: Secondary | ICD-10-CM

## 2019-03-19 DIAGNOSIS — R059 Cough, unspecified: Secondary | ICD-10-CM

## 2019-03-19 DIAGNOSIS — Z20822 Contact with and (suspected) exposure to covid-19: Secondary | ICD-10-CM

## 2019-03-19 MED ORDER — FLUTICASONE PROPIONATE 50 MCG/ACT NA SUSP: 2.0000 | Freq: Every day | NASAL | 11 refills | Status: DC

## 2019-03-19 NOTE — Progress Notes (Signed)
Patient: Tami Williams Female    DOB: 04/21/1966   53 y.o.   MRN: [id number] Visit Date: 03/19/2019  Today's Provider: Lavon Paganini, MD   Chief Complaint  Patient presents with  . URI   Subjective:    Virtual Visit via Video Note  I connected with Tami Williams on 03/19/19 at  8:20 AM EST by a video enabled telemedicine application and verified that I am speaking with the correct person using two identifiers.   Patient location: home Provider location: [place] involved in the visit: patient, provider   I discussed the limitations of evaluation and management by telemedicine and the availability of in person appointments. The patient expressed understanding and agreed to proceed.  Note: Patient unable to connect through [place], but used [email or web address] instead  HPI Upper Respiratory Infection: Patient complains of symptoms of a URI, possible sinusitis. Symptoms include bilateral ear pain, congestion, cough and sore throat. Onset of symptoms was 3 days ago, gradually worsening since that time. She also c/o bilateral ear pressure/pain, congestion, facial pain, lightheadedness, nausea without vomiting and sinus pressure for the past 3 days .  She is drinking plenty of fluids. Evaluation to date: none. Treatment to date: OTC Sinus medication.  No fever, abd pain +Rhinorrhea, sneezing, post-nasal drip  No known sick contacts Continually getting worse Often gets sinus infection/allergic rhinitis around this time of year Having jaw and tooth pain last night from it  Has used flonase in the past, but is out of refills  Allergies  Allergen Reactions  . No Known Allergies      Current Outpatient Medications:  .  fluticasone (FLONASE) 50 MCG/ACT nasal spray, Place 2 sprays into both nostrils as needed. , Disp: , Rfl:  .  lisinopril (ZESTRIL) 10 MG tablet, TAKE 1 TABLET(10 MG) BY MOUTH DAILY, Disp: 90 tablet, Rfl: 1 .  pantoprazole (PROTONIX) 40 MG  tablet, TAKE 1 TABLET(40 MG) BY MOUTH DAILY, Disp: 90 tablet, Rfl: 3 .  SYNTHROID 50 MCG tablet, TK 1 T PO QD, Disp: , Rfl: 6 .  traZODone (DESYREL) 50 MG tablet, Take 1 tablet (50 mg total) by mouth at bedtime as needed for sleep., Disp: 90 tablet, Rfl: 1  Review of Systems  Constitutional: Positive for chills.  HENT: Positive for congestion, ear pain, postnasal drip, rhinorrhea, sinus pressure, sinus pain and sore throat.   Respiratory: Positive for cough.   Gastrointestinal: Positive for nausea.  Neurological: Positive for headaches.    Social History   Tobacco Use  . Smoking status: Never Smoker  . Smokeless tobacco: Never Used  Substance Use Topics  . Alcohol use: Yes    Alcohol/week: 0.0 standard drinks    Comment: occasional      Objective:   BP (!) 135/100 (BP Location: Left Arm, Patient Position: Sitting, Cuff Size: Normal)   Pulse 66   Wt 129 lb (58.5 kg)   BMI 22.14 kg/m  Vitals:   03/19/19 0827  BP: (!) 135/100  Pulse: 66  Weight: 129 lb (58.5 kg)  Body mass index is 22.14 kg/m.   Physical Exam Constitutional:      General: She is not in acute distress.    Appearance: Normal appearance.  HENT:     Head: Normocephalic and atraumatic.  Eyes:     General: No scleral icterus.    Conjunctiva/sclera: Conjunctivae normal.  Pulmonary:     Effort: Pulmonary effort is normal. No respiratory distress.  Neurological:  Mental Status: She is alert and oriented to person, place, and time. Mental status is at baseline.  Psychiatric:        Mood and Affect: Mood normal.        Behavior: Behavior normal.      No results found for any visits on 03/19/19.     Assessment & Plan    I discussed the assessment and treatment plan with the patient. The patient was provided an opportunity to ask questions and all were answered. The patient agreed with the plan and demonstrated an understanding of the instructions.   The patient was advised to call back or seek an  in-person evaluation if the symptoms worsen or if the condition fails to improve as anticipated.   1. Sinus congestion 2. Cough 3. Rhinorrhea - symptoms and exam c/w viral URI  - no evidence of strep pharyngitis, CAP, AOM, bacterial sinusitis, or other bacterial infection - discussed that bacterial sinusitis is considered after at least 7-10 days of sinus pressure/congestion - discussed possibility of COVID19 infection in the setting of the current pandemic - patient will self isolate for 10 days from start of illness - she will go for outpatient COVID19 testing today - discussed symptomatic management, natural course, and return precautions  - Novel Coronavirus, NAA ([facility])    Meds ordered this encounter  Medications  . fluticasone (FLONASE) 50 MCG/ACT nasal spray    Sig: Place 2 sprays into both nostrils daily.    Dispense:  16 g    Refill:  11     Return if symptoms worsen or fail to improve.   The entirety of the information documented in the History of Present Illness, Review of Systems and Physical Exam were personally obtained by me. Portions of this information were initially documented by Rondel Baton, CMA and reviewed by me for thoroughness and accuracy.    Sankalp Ferrell, Marzella Schlein, MD MPH [facility]

## 2019-03-19 NOTE — Patient Instructions (Signed)
This information is directly available on the CDC website: https://www.cdc.gov/coronavirus/2019-ncov/if-you-are-sick/steps-when-sick.html    Source:CDC Reference to specific commercial products, manufacturers, companies, or trademarks does not constitute its endorsement or recommendation by the U.S. Government, Department of Health and Human Services, or Centers for Disease Control and Prevention.  

## 2019-03-20 LAB — NOVEL CORONAVIRUS, NAA: SARS-CoV-2, NAA: NOT DETECTED

## 2019-04-26 ENCOUNTER — Telehealth: Payer: Self-pay | Admitting: Physician Assistant

## 2019-04-26 DIAGNOSIS — N951 Menopausal and female climacteric states: Secondary | ICD-10-CM

## 2019-04-26 MED ORDER — PAROXETINE HCL 10 MG PO TABS: 10.0000 mg | ORAL_TABLET | Freq: Every day | ORAL | 1 refills | Status: DC

## 2019-04-26 NOTE — Telephone Encounter (Signed)
Medication Refill - Medication: something for hot flashes   Pt is having a lot of trouble with hot flashes. She is taking off jackets and putting them back on al the time.  She is having a real hard time with this.  This was talked about at last visit and pt is ready  Has the patient contacted their pharmacy? No. (Agent: If no, request that the patient contact the pharmacy for the refill.) (Agent: If yes, when and what did the pharmacy advise?)  Preferred Pharmacy (with phone number or street name): [facility]  cb for pt [phone]  Agent: Please be advised that RX refills may take up to 3 business days. We ask that you follow-up with your pharmacy.

## 2019-04-26 NOTE — Telephone Encounter (Signed)
From [facility] 

## 2019-04-26 NOTE — Telephone Encounter (Signed)
Patient advised as directed below. 

## 2019-04-26 NOTE — Telephone Encounter (Signed)
Will start with non-hormonal options. Sent in paroxetine for hot flashes. She can let me know if this helps by sending a mychart message in 3-4 weeks.

## 2019-05-08 ENCOUNTER — Other Ambulatory Visit: Payer: Self-pay | Admitting: Physician Assistant

## 2019-05-08 DIAGNOSIS — I10 Essential (primary) hypertension: Secondary | ICD-10-CM

## 2019-05-08 NOTE — Telephone Encounter (Signed)
Requested medication (s) are due for refill today:yes Requested medication (s) are on the active medication list: yes  Last refill:  10/23/2018  Future visit scheduled: no  Notes to clinic:  cardiovascular: ACE Inhibitors failed  Requested Prescriptions  Pending Prescriptions Disp Refills   lisinopril (ZESTRIL) 10 MG tablet [Pharmacy Med Name: LISINOPRIL 10MG  TABLETS] 90 tablet 1    Sig: TAKE 1 TABLET(10 MG) BY MOUTH DAILY      Cardiovascular:  ACE Inhibitors Failed - 05/08/2019  5:40 PM      Failed - Cr in normal range and within 180 days    Creatinine, Ser  Date Value Ref Range Status  06/15/2018 0.90 0.57 - 1.00 mg/dL Final          Failed - K in normal range and within 180 days    Potassium  Date Value Ref Range Status  06/15/2018 4.9 3.5 - 5.2 mmol/L Final          Failed - Last BP in normal range    BP Readings from Last 1 Encounters:  03/19/19 (!) 135/100          Passed - Patient is not pregnant      Passed - Valid encounter within last 6 months    Recent Outpatient Visits           1 month ago Sinus congestion   [facility] [place], Dionne Bucy, MD   6 months ago Difficulty sleeping   [facility] [place] M, [place]   10 months ago Annual physical exam   [place], Clearnce Sorrel, [place]   1 year ago Pain of left eye   [place], [place], [place]   1 year ago Fever, unspecified fever cause   [place], [place], [place]

## 2019-05-20 ENCOUNTER — Ambulatory Visit: Payer: BC Managed Care – PPO | Attending: Physician Assistant

## 2019-05-20 DIAGNOSIS — Z20822 Contact with and (suspected) exposure to covid-19: Secondary | ICD-10-CM

## 2019-05-21 LAB — NOVEL CORONAVIRUS, NAA: SARS-CoV-2, NAA: DETECTED — AB

## 2019-06-05 ENCOUNTER — Ambulatory Visit: Payer: Self-pay

## 2019-06-05 NOTE — Telephone Encounter (Signed)
Patient called stating that she has been really nauseated She states that nausea has been on ongoing problem and she is prescribed a nausea medication that she takes when necessary.  She has had reflux surgery x2 so she states she is unable to vomit. She had diarrhea Monday. She states that may have been because she has had gull bladder removed.  She reports that she has not been able to eat. Yesterday she had creamed potatoes celery and carrots.  She states that made her very nauseated.  Today she has had a biscuit with butter and tomato and still feel nauseated. She is voiding. She is drinking fluids.  She was Dx with COVID-19 05/20/19. She has lost weight from around 124lbs  to now 113lbs. Care advice was read to patient. Appointment scheduled per protocol. Virtual per DT. Patient verbalized understanding and will wait for call.  Reason for Disposition . Nausea lasts > 1 week  Answer Assessment - Initial Assessment Questions 1. NAUSEA SEVERITY: "How bad is the nausea?" (e.g., mild, moderate, severe; dehydration, weight loss)   - MILD: loss of appetite without change in eating habits   - MODERATE: decreased oral intake without significant weight loss, dehydration, or malnutrition   - SEVERE: inadequate caloric or fluid intake, significant weight loss, symptoms of dehydration     Weight loss, sometimes dizzy 2. ONSET: "When did the nausea begin?"    2-3 days 3. VOMITING: "Any vomiting?" If so, ask: "How many times today?"     No reflux surgery dry heaves 4. RECURRENT SYMPTOM: "Have you had nausea before?" If so, ask: "When was the last time?" "What happened that time?"     On nausea medication 5. CAUSE: "What do you think is causing the nausea?"    unsure 6. PREGNANCY: "Is there any chance you are pregnant?" (e.g., unprotected intercourse, missed birth control pill, broken condom)     No histerectmy  Protocols used: NAUSEA-A-AH

## 2019-06-06 ENCOUNTER — Encounter: Payer: Self-pay | Admitting: Physician Assistant

## 2019-06-06 ENCOUNTER — Other Ambulatory Visit: Payer: Self-pay

## 2019-06-06 ENCOUNTER — Ambulatory Visit (INDEPENDENT_AMBULATORY_CARE_PROVIDER_SITE_OTHER): Payer: BC Managed Care – PPO | Admitting: Physician Assistant

## 2019-06-06 DIAGNOSIS — R110 Nausea: Secondary | ICD-10-CM | POA: Diagnosis not present

## 2019-06-06 MED ORDER — ONDANSETRON HCL 4 MG PO TABS: 4.0000 mg | ORAL_TABLET | Freq: Three times a day (TID) | ORAL | 5 refills | Status: DC | PRN

## 2019-06-06 NOTE — Patient Instructions (Signed)
Nausea, Adult Nausea is the feeling that you have an upset stomach or that you are about to vomit. Nausea on its own is not usually a serious concern, but it may be an early sign of a more serious medical problem. As nausea gets worse, it can lead to vomiting. If vomiting develops, or if you are not able to drink enough fluids, you are at risk of becoming dehydrated. Dehydration can make you tired and thirsty, cause you to have a dry mouth, and decrease how often you urinate. Older adults and people with other diseases or a weak disease-fighting system (immune system) are at higher risk for dehydration. The main goals of treating your nausea are:  To relieve your nausea.  To limit repeated nausea episodes.  To prevent vomiting and dehydration. Follow these instructions at home: Watch your symptoms for any changes. Tell your health care provider about them. Follow these instructions as told by your health care provider. Eating and drinking      Take an oral rehydration solution (ORS). This is a drink that is sold at pharmacies and retail stores.  Drink clear fluids slowly and in small amounts as you are able. Clear fluids include water, ice chips, low-calorie sports drinks, and fruit juice that has water added (diluted fruit juice).  Eat bland, easy-to-digest foods in small amounts as you are able. These foods include bananas, applesauce, rice, lean meats, toast, and crackers.  Avoid drinking fluids that contain a lot of sugar or caffeine, such as energy drinks, sports drinks, and soda.  Avoid alcohol.  Avoid spicy or fatty foods. General instructions  Take over-the-counter and prescription medicines only as told by your health care provider.  Rest at home while you recover.  Drink enough fluid to keep your urine pale yellow.  Breathe slowly and deeply when you feel nauseous.  Avoid smelling things that have strong odors.  Wash your hands often using soap and water. If soap and  water are not available, use hand sanitizer.  Make sure that all people in your household wash their hands well and often.  Keep all follow-up visits as told by your health care provider. This is important. Contact a health care provider if:  Your nausea gets worse.  Your nausea does not go away after two days.  You vomit.  You cannot drink fluids without vomiting.  You have any of the following: ? New symptoms. ? A fever. ? A headache. ? Muscle cramps. ? A rash. ? Pain while urinating.  You feel light-headed or dizzy. Get help right away if:  You have pain in your chest, neck, arm, or jaw.  You feel extremely weak or you faint.  You have vomit that is bright red or looks like coffee grounds.  You have bloody or black stools or stools that look like tar.  You have a severe headache, a stiff neck, or both.  You have severe pain, cramping, or bloating in your abdomen.  You have difficulty breathing or are breathing very quickly.  Your heart is beating very quickly.  Your skin feels cold and clammy.  You feel confused.  You have signs of dehydration, such as: ? Dark urine, very little urine, or no urine. ? Cracked lips. ? Dry mouth. ? Sunken eyes. ? Sleepiness. ? Weakness. These symptoms may represent a serious problem that is an emergency. Do not wait to see if the symptoms will go away. Get medical help right away. Call your local emergency services (911   in the [place] Do not drive yourself to the hospital. Summary  Nausea is the feeling that you have an upset stomach or that you are about to vomit. Nausea on its own is not usually a serious concern, but it may be an early sign of a more serious medical problem.  If vomiting develops, or if you are not able to drink enough fluids, you are at risk of becoming dehydrated.  Follow recommendations for eating and drinking and take over-the-counter and prescription medicines only as told by your health care  provider.  Contact a health care provider right away if your symptoms worsen or you have new symptoms.  Keep all follow-up visits as told by your health care provider. This is important. This information is not intended to replace advice given to you by your health care provider. Make sure you discuss any questions you have with your health care provider. Document Revised: 10/10/2017 Document Reviewed: 10/10/2017 Elsevier Patient Education  2020 [employer] Inc[employer]  

## 2019-06-06 NOTE — Progress Notes (Signed)
Patient: Tami Williams Female    DOB: [birth year]   54 y.o.   MRN: [id number] Visit Date: 06/06/2019  Today's Provider: Margaretann Loveless, PA-C   Chief Complaint  Patient presents with  . Nausea    Has had it since COVID about two weeks ago.   Subjective:     Virtual Visit via Video Note  I connected with Tami Williams on 06/06/19 at  7:20 AM EST by a video enabled telemedicine application and verified that I am speaking with the correct person using two identifiers. Interactive audio and video communications were attempted, although failed due to patient's inability to connect to video. Continued visit with audio only interaction with patient agreement.  Location: Patient: home Provider: Home office in [place], [place]   I discussed the limitations of evaluation and management by telemedicine and the availability of in person appointments. The patient expressed understanding and agreed to proceed.  HPI  Tami Williams is a 54 yr old female that has had some chronic nausea since having 2 Nissen fundoplication surgeries for hiatal hernia presents with acute worsening of nausea and diarrhea. She was diagnosed with Covid-19 on 05/20/19. She has had nausea since then. She does report that during the 1st week she did lose her sense of smell and taste and that is what she thinks was driving the nausea more at that time. She does have her smell and taste back now.   Then on Monday, 06/03/19, she woke up at 6 am with abdominal cramping, severe nausea and diarrhea. She reports during the 1st 8 minutes of being awake she went to the bathroom 3 times. She then went 4 more times after that through the morning. Stools were watery. She also started dry heaving (unable to vomit due to her surgeries) during this time as well. Nausea was so severe she was unable to eat anything much. Tried to drink water and sugar-free gatorade to stay hydrated and ate a couple saltine crackers. Tuesday also was still  quite nauseated but no more instances of diarrhea or dry heaves. She has been able to slowly increase diet over the last 2 days, but still has some mild nausea with certain foods (coffee).   Allergies  Allergen Reactions  . No Known Allergies      Current Outpatient Medications:  .  fluticasone (FLONASE) 50 MCG/ACT nasal spray, Place 2 sprays into both nostrils daily., Disp: 16 g, Rfl: 11 .  lisinopril (ZESTRIL) 10 MG tablet, TAKE 1 TABLET(10 MG) BY MOUTH DAILY, Disp: 90 tablet, Rfl: 1 .  pantoprazole (PROTONIX) 40 MG tablet, TAKE 1 TABLET(40 MG) BY MOUTH DAILY, Disp: 90 tablet, Rfl: 3 .  PARoxetine (PAXIL) 10 MG tablet, Take 1 tablet (10 mg total) by mouth daily., Disp: 30 tablet, Rfl: 1 .  SYNTHROID 50 MCG tablet, TK 1 T PO QD, Disp: , Rfl: 6 .  traZODone (DESYREL) 50 MG tablet, Take 1 tablet (50 mg total) by mouth at bedtime as needed for sleep., Disp: 90 tablet, Rfl: 1  Review of Systems  Constitutional: Positive for appetite change and unexpected weight change. Negative for activity change, chills, diaphoresis, fatigue and fever.  HENT: Negative.   Respiratory: Negative.   Gastrointestinal: Positive for abdominal pain, diarrhea (Only on Monday ) and nausea (with dry heaves, unable to vomit). Negative for abdominal distention, anal bleeding, blood in stool, constipation, rectal pain and vomiting.  Neurological: Positive for weakness. Negative for dizziness, light-headedness and headaches.  Social History   Tobacco Use  . Smoking status: Never Smoker  . Smokeless tobacco: Never Used  Substance Use Topics  . Alcohol use: Yes    Alcohol/week: 0.0 standard drinks    Comment: occasional      Objective:   There were no vitals taken for this visit. There were no vitals filed for this visit.There is no height or weight on file to calculate BMI.   Physical Exam Vitals reviewed.  Constitutional:      General: She is not in acute distress. Pulmonary:     Effort: No  respiratory distress.  Neurological:     Mental Status: She is alert.      No results found for any visits on 06/06/19.     Assessment & Plan    1. Nausea Suspect secondary to Covid 19 infection. Continue to push fluids and continue bland diet and increase as tolerated. Zofran refilled as below. Call if symptoms continue or worsen.  - ondansetron (ZOFRAN) 4 MG tablet; Take 1 tablet (4 mg total) by mouth every 8 (eight) hours as needed.  Dispense: 20 tablet; Refill: 5     I discussed the assessment and treatment plan with the patient. The patient was provided an opportunity to ask questions and all were answered. The patient agreed with the plan and demonstrated an understanding of the instructions.   The patient was advised to call back or seek an in-person evaluation if the symptoms worsen or if the condition fails to improve as anticipated.  I provided 11 minutes of non-face-to-face time during this encounter.    Mar Daring, PA-C  [facility]

## 2019-06-12 ENCOUNTER — Other Ambulatory Visit: Payer: Self-pay | Admitting: Physician Assistant

## 2019-06-12 DIAGNOSIS — N951 Menopausal and female climacteric states: Secondary | ICD-10-CM

## 2019-06-16 ENCOUNTER — Other Ambulatory Visit: Payer: Self-pay | Admitting: Physician Assistant

## 2019-06-16 DIAGNOSIS — G479 Sleep disorder, unspecified: Secondary | ICD-10-CM

## 2019-06-25 ENCOUNTER — Emergency Department: Payer: BC Managed Care – PPO

## 2019-06-25 ENCOUNTER — Other Ambulatory Visit: Payer: Self-pay

## 2019-06-25 ENCOUNTER — Encounter: Payer: Self-pay | Admitting: Emergency Medicine

## 2019-06-25 ENCOUNTER — Emergency Department
Admission: EM | Admit: 2019-06-25 | Discharge: 2019-06-25 | Disposition: A | Discharge status: Home / Self Care | Payer: BC Managed Care – PPO | Attending: Emergency Medicine | Admitting: Emergency Medicine

## 2019-06-25 DIAGNOSIS — K567 Ileus, unspecified: Secondary | ICD-10-CM | POA: Diagnosis not present

## 2019-06-25 DIAGNOSIS — R1031 Right lower quadrant pain: Secondary | ICD-10-CM | POA: Diagnosis present

## 2019-06-25 DIAGNOSIS — I10 Essential (primary) hypertension: Secondary | ICD-10-CM | POA: Insufficient documentation

## 2019-06-25 LAB — COMPREHENSIVE METABOLIC PANEL
ALT: 49 U/L — ABNORMAL HIGH (ref 0–44)
AST: 40 U/L (ref 15–41)
Albumin: 4.2 g/dL (ref 3.5–5.0)
Alkaline Phosphatase: 111 U/L (ref 38–126)
Anion gap: 7 (ref 5–15)
BUN: 14 mg/dL (ref 6–20)
CO2: 30 mmol/L (ref 22–32)
Calcium: 9.6 mg/dL (ref 8.9–10.3)
Chloride: 98 mmol/L (ref 98–111)
Creatinine, Ser: 0.7 mg/dL (ref 0.44–1.00)
GFR calc Af Amer: >60 mL/min (ref >60)
GFR calc non Af Amer: >60 mL/min (ref >60)
Glucose, Bld: 124 mg/dL — ABNORMAL HIGH (ref 70–99)
Potassium: 3.8 mmol/L (ref 3.5–5.1)
Sodium: 135 mmol/L (ref 135–145)
Total Bilirubin: 0.6 mg/dL (ref 0.3–1.2)
Total Protein: 7.2 g/dL (ref 6.5–8.1)

## 2019-06-25 LAB — CBC
HCT: 39.5 % (ref 36.0–46.0)
Hemoglobin: 13.2 g/dL (ref 12.0–15.0)
MCH: 30.8 pg (ref 26.0–34.0)
MCHC: 33.4 g/dL (ref 30.0–36.0)
MCV: 92.3 fL (ref 80.0–100.0)
Platelets: 323 10*3/uL (ref 150–400)
RBC: 4.28 MIL/uL (ref 3.87–5.11)
RDW: 13.1 % (ref 11.5–15.5)
WBC: 8.7 10*3/uL (ref 4.0–10.5)
nRBC: 0 % (ref 0.0–0.2)

## 2019-06-25 LAB — LIPASE, BLOOD: Lipase: 31 U/L (ref 11–51)

## 2019-06-25 MED ORDER — IOHEXOL 300 MG/ML  SOLN
75.0000 mL | Freq: Once | INTRAMUSCULAR | Status: AC | PRN
  Administered 2019-06-25: 75 mL via INTRAVENOUS

## 2019-06-25 MED ORDER — HYDROMORPHONE HCL 1 MG/ML IJ SOLN
0.5000 mg | Freq: Once | INTRAMUSCULAR | Status: AC
  Administered 2019-06-25: 0.5 mg via INTRAVENOUS
  Filled 2019-06-25 (×2): qty 1

## 2019-06-25 MED ORDER — FAMOTIDINE IN NACL 20-0.9 MG/50ML-% IV SOLN
20.0000 mg | Freq: Once | INTRAVENOUS | Status: AC
  Administered 2019-06-25: 20 mg via INTRAVENOUS
  Filled 2019-06-25: qty 50

## 2019-06-25 MED ORDER — ONDANSETRON HCL 4 MG/2ML IJ SOLN
4.0000 mg | Freq: Once | INTRAMUSCULAR | Status: AC | PRN
  Administered 2019-06-25: 4 mg via INTRAVENOUS
  Filled 2019-06-25: qty 2

## 2019-06-25 MED ORDER — ONDANSETRON HCL 4 MG/2ML IJ SOLN
4.0000 mg | Freq: Once | INTRAMUSCULAR | Status: AC
  Administered 2019-06-25: 21:00:00 4 mg via INTRAVENOUS
  Filled 2019-06-25: qty 2

## 2019-06-25 MED ORDER — FENTANYL CITRATE (PF) 100 MCG/2ML IJ SOLN
50.0000 ug | INTRAMUSCULAR | Status: DC | PRN
  Administered 2019-06-25: 50 ug via INTRAVENOUS
  Filled 2019-06-25 (×2): qty 2

## 2019-06-25 MED ORDER — SORBITOL 70 % SOLN
960.0000 mL | TOPICAL_OIL | Freq: Once | ORAL | Status: AC
  Administered 2019-06-25: 960 mL via RECTAL
  Filled 2019-06-25: qty 473

## 2019-06-25 MED ORDER — ONDANSETRON HCL 4 MG/2ML IJ SOLN
4.0000 mg | Freq: Once | INTRAMUSCULAR | Status: AC
  Administered 2019-06-25: 18:00:00 4 mg via INTRAVENOUS
  Filled 2019-06-25: qty 2

## 2019-06-25 MED ORDER — MORPHINE SULFATE (PF) 4 MG/ML IV SOLN
4.0000 mg | Freq: Once | INTRAVENOUS | Status: AC
  Administered 2019-06-25: 4 mg via INTRAVENOUS
  Filled 2019-06-25: qty 1

## 2019-06-25 NOTE — ED Triage Notes (Signed)
Patient reports RLQ abdominal pain and upper mid abdominal pain that started yesterday. Reports this morning she started having nausea and dry heaves. Patient reports one episode of vomiting. Reports hx of cholecystectomy. Denies hx of kidney stones.

## 2019-06-25 NOTE — ED Provider Notes (Signed)
[facility] Emergency Department Provider Note       Time seen: ----------------------------------------- 5:10 PM on 06/25/2019 -----------------------------------------   I have reviewed the triage vital signs and the nursing notes.  HISTORY   Chief Complaint Abdominal Pain    HPI Tami Williams is a 54 y.o. female with a history of GERD, hypertension, generalized anxiety disorder who presents to the ED for right lower quadrant abdominal pain and upper abdominal pain that started yesterday.  Patient reports this morning she started having nausea and dry heaves.  She reports history of cholecystectomy, pain is 10 out of 10 in the right lower abdomen.  Past Medical History:  Diagnosis Date  . Acute epigastric pain   . GAD (generalized anxiety disorder)   . GERD (gastroesophageal reflux disease)   . Hypertension   . Non-cardiac chest pain     Patient Active Problem List   Diagnosis Date Noted  . Biliary dyskinesia 12/07/2016  . Perimenopausal symptoms 09/13/2016  . Allergic rhinitis 03/20/2015  . Cold sore 03/20/2015  . Abnormal liver enzymes 03/20/2015  . 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 Hypertension 03/20/2015  . Diaphragmatic hernia 03/20/2015  . 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 Anxiety disorder 03/20/2015  . Acid reflux 09/23/2007  . Gastro-esophageal reflux disease without esophagitis 09/23/2007    Past Surgical History:  Procedure Laterality Date  . [place] STUDY N/A 01/06/2016   Procedure: [place] STUDY;  Surgeon: Lucilla Lame, MD;  Location: [facility] ENDOSCOPY;  Service: Endoscopy;  Laterality: N/A;  . ABDOMINAL HYSTERECTOMY     due to endometriosis  . ESOPHAGEAL MANOMETRY N/A 01/06/2016   Procedure: ESOPHAGEAL MANOMETRY (EM);  Surgeon: Lucilla Lame, MD;  Location: [facility] ENDOSCOPY;  Service: Endoscopy;  Laterality: N/A;  . ESOPHAGOGASTRODUODENOSCOPY    .  ESOPHAGOGASTRODUODENOSCOPY (EGD) WITH PROPOFOL N/A 12/02/2015   Procedure: ESOPHAGOGASTRODUODENOSCOPY (EGD) WITH PROPOFOL;  Surgeon: Manya Silvas, MD;  Location: [facility] ENDOSCOPY;  Service: Endoscopy;  Laterality: N/A;  . FOOT SURGERY Left 04/2018  . NISSEN FUNDOPLICATION  [id number]   dr. Tamala Julian    Allergies No known allergies  Social History Social History   Tobacco Use  . Smoking status: Never Smoker  . Smokeless tobacco: Never Used  Substance Use Topics  . Alcohol use: Yes    Alcohol/week: 0.0 standard drinks    Comment: occasional  . Drug use: No    Review of Systems Constitutional: Negative for fever. Cardiovascular: Negative for chest pain. Respiratory: Negative for shortness of breath. Gastrointestinal: Positive for abdominal pain, vomiting Musculoskeletal: Negative for back pain. Skin: Negative for rash. Neurological: Negative for headaches, focal weakness or numbness.  All systems negative/normal/unremarkable except as stated in the HPI  ____________________________________________   PHYSICAL EXAM:  VITAL SIGNS: ED Triage Vitals  Enc Vitals Group     BP 06/25/19 1658 (!) 127/105     Pulse Rate 06/25/19 1658 73     Resp 06/25/19 1658 16     Temp 06/25/19 1658 98 F (36.7 C)     Temp Source 06/25/19 1658 Oral     SpO2 06/25/19 1658 98 %     Weight 06/25/19 1659 117 lb (53.1 kg)     Height 06/25/19 1659 5\' 4"  (1.626 m)     Head Circumference --      Peak Flow --      Pain Score 06/25/19 1658 10     Pain Loc --      Pain Edu? --  Excl. in GC? --     Constitutional: Alert and oriented.  Mild distress from pain Eyes: Conjunctivae are normal. Normal extraocular movements. Cardiovascular: Normal rate, regular rhythm. No murmurs, rubs, or gallops. Respiratory: Normal respiratory effort without tachypnea nor retractions. Breath sounds are clear and equal bilaterally. No wheezes/rales/rhonchi. Gastrointestinal: Epigastric and right lower quadrant  tenderness, normal bowel sounds. Musculoskeletal: Nontender with normal range of motion in extremities. No lower extremity tenderness nor edema. Neurologic:  Normal speech and language. No gross focal neurologic deficits are appreciated.  Skin:  Skin is warm, dry and intact. No rash noted. Psychiatric: Mood and affect are normal. Speech and behavior are normal.  ____________________________________________  ED COURSE:  As part of my medical decision making, I reviewed the following data within the electronic MEDICAL RECORD NUMBER History obtained from family if available, nursing notes, old chart and ekg, as well as notes from prior ED visits. Patient presented for abdominal pain and vomiting, we will assess with labs and imaging as indicated at this time.   Procedures  Tami Williams was evaluated in Emergency Department on 06/25/2019 for the symptoms described in the history of present illness. She was evaluated in the context of the global COVID-19 pandemic, which necessitated consideration that the patient might be at risk for infection with the SARS-CoV-2 virus that causes COVID-19. Institutional protocols and algorithms that pertain to the evaluation of patients at risk for COVID-19 are in a state of rapid change based on information released by regulatory bodies including the CDC and federal and state organizations. These policies and algorithms were followed during the patient's care in the ED.  ____________________________________________   LABS (pertinent positives/negatives)  Labs Reviewed  COMPREHENSIVE METABOLIC PANEL - Abnormal; Notable for the following components:      Result Value   Glucose, Bld 124 (*)    ALT 49 (*)    All other components within normal limits  LIPASE, BLOOD  CBC  URINALYSIS, COMPLETE (UACMP) WITH MICROSCOPIC    RADIOLOGY Images were viewed by me  CT the abdomen pelvis with contrast IMPRESSION:  1. Moderate distention of the small bowel and colon with   stool-filled colon suggesting constipation and diffuse ileus.  2. Status post cholecystectomy with mild associated common bile duct  dilatation.  3. Status post hysterectomy.  ____________________________________________   DIFFERENTIAL DIAGNOSIS   Appendicitis, GERD, peptic ulcer disease, IBS, obstruction  FINAL ASSESSMENT AND PLAN  Abdominal pain, vomiting   Plan: The patient had presented for abdominal pain and vomiting. Patient's labs did not reveal any acute process. Patient's imaging revealed moderate distention of the small bowel and colon suggesting constipation and diffuse ileus.  We will attempt an enema.   Patient had 3 large bowel movements after smog enema and feels better.  Abdomen is soft and not tender at this time.  She is advised take MiraLAX daily.  She is cleared for outpatient follow-up.  Ulice Dash, MD    Note: This note was generated in part or whole with voice recognition software. Voice recognition is usually quite accurate but there are transcription errors that can and very often do occur. I apologize for any typographical errors that were not detected and corrected.     Emily Filbert, MD 06/25/19 2203

## 2019-06-25 NOTE — ED Notes (Signed)
Pt to Ct at this time.

## 2019-06-25 NOTE — ED Notes (Signed)
Pt states that she has had 3 loose BM since the enema and is experiencing great relief. Reports that she is no longer in the amount of pain that she was previously in prior to enema.

## 2019-06-25 NOTE — ED Triage Notes (Signed)
FIRST NURSE NOTE- here for right abd pain starting today.  No hx kidney stones.

## 2019-06-25 NOTE — ED Notes (Signed)
Pt reports that she is experiencing increase nausea, MD Williams notified.

## 2019-06-28 ENCOUNTER — Emergency Department: Payer: BC Managed Care – PPO

## 2019-06-28 ENCOUNTER — Ambulatory Visit: Payer: Self-pay

## 2019-06-28 ENCOUNTER — Other Ambulatory Visit: Payer: Self-pay

## 2019-06-28 ENCOUNTER — Emergency Department
Admission: EM | Admit: 2019-06-28 | Discharge: 2019-06-28 | Disposition: A | Discharge status: Home / Self Care | Payer: BC Managed Care – PPO | Attending: Student | Admitting: Student

## 2019-06-28 ENCOUNTER — Encounter: Payer: Self-pay | Admitting: Radiology

## 2019-06-28 DIAGNOSIS — R1033 Periumbilical pain: Secondary | ICD-10-CM | POA: Diagnosis not present

## 2019-06-28 DIAGNOSIS — R9389 Abnormal findings on diagnostic imaging of other specified body structures: Secondary | ICD-10-CM

## 2019-06-28 DIAGNOSIS — R1031 Right lower quadrant pain: Secondary | ICD-10-CM

## 2019-06-28 DIAGNOSIS — K567 Ileus, unspecified: Secondary | ICD-10-CM

## 2019-06-28 DIAGNOSIS — Z79899 Other long term (current) drug therapy: Secondary | ICD-10-CM | POA: Insufficient documentation

## 2019-06-28 DIAGNOSIS — I10 Essential (primary) hypertension: Secondary | ICD-10-CM | POA: Insufficient documentation

## 2019-06-28 LAB — URINALYSIS, COMPLETE (UACMP) WITH MICROSCOPIC
Bacteria, UA: NONE SEEN
Bilirubin Urine: NEGATIVE
Glucose, UA: NEGATIVE mg/dL
Hgb urine dipstick: NEGATIVE
Ketones, ur: NEGATIVE mg/dL
Leukocytes,Ua: NEGATIVE
Nitrite: NEGATIVE
Protein, ur: NEGATIVE mg/dL
Specific Gravity, Urine: 1.009 (ref 1.005–1.030)
pH: 7 (ref 5.0–8.0)

## 2019-06-28 LAB — CBC
HCT: 38.2 % (ref 36.0–46.0)
Hemoglobin: 12.6 g/dL (ref 12.0–15.0)
MCH: 30.7 pg (ref 26.0–34.0)
MCHC: 33 g/dL (ref 30.0–36.0)
MCV: 93.2 fL (ref 80.0–100.0)
Platelets: 269 10*3/uL (ref 150–400)
RBC: 4.1 MIL/uL (ref 3.87–5.11)
RDW: 13 % (ref 11.5–15.5)
WBC: 4.6 10*3/uL (ref 4.0–10.5)
nRBC: 0 % (ref 0.0–0.2)

## 2019-06-28 LAB — COMPREHENSIVE METABOLIC PANEL
ALT: 44 U/L (ref 0–44)
AST: 36 U/L (ref 15–41)
Albumin: 4.2 g/dL (ref 3.5–5.0)
Alkaline Phosphatase: 99 U/L (ref 38–126)
Anion gap: 10 (ref 5–15)
BUN: 13 mg/dL (ref 6–20)
CO2: 30 mmol/L (ref 22–32)
Calcium: 9.5 mg/dL (ref 8.9–10.3)
Chloride: 98 mmol/L (ref 98–111)
Creatinine, Ser: 0.73 mg/dL (ref 0.44–1.00)
GFR calc Af Amer: >60 mL/min (ref >60)
GFR calc non Af Amer: >60 mL/min (ref >60)
Glucose, Bld: 100 mg/dL — ABNORMAL HIGH (ref 70–99)
Potassium: 4.7 mmol/L (ref 3.5–5.1)
Sodium: 138 mmol/L (ref 135–145)
Total Bilirubin: 0.5 mg/dL (ref 0.3–1.2)
Total Protein: 7.1 g/dL (ref 6.5–8.1)

## 2019-06-28 LAB — LIPASE, BLOOD: Lipase: 35 U/L (ref 11–51)

## 2019-06-28 LAB — MAGNESIUM: Magnesium: 2.1 mg/dL (ref 1.7–2.4)

## 2019-06-28 MED ORDER — SENNA 8.6 MG PO TABS: 2.0000 | ORAL_TABLET | Freq: Two times a day (BID) | ORAL | 0 refills | Status: AC

## 2019-06-28 MED ORDER — SODIUM CHLORIDE 0.9 % IV BOLUS
1000.0000 mL | Freq: Once | INTRAVENOUS | Status: AC
  Administered 2019-06-28: 16:00:00 1000 mL via INTRAVENOUS

## 2019-06-28 MED ORDER — ONDANSETRON HCL 4 MG/2ML IJ SOLN
4.0000 mg | Freq: Once | INTRAMUSCULAR | Status: AC
  Administered 2019-06-28: 16:00:00 4 mg via INTRAVENOUS
  Filled 2019-06-28: qty 2

## 2019-06-28 MED ORDER — IOHEXOL 9 MG/ML PO SOLN
1000.0000 mL | Freq: Once | ORAL | Status: AC | PRN
  Administered 2019-06-28: 15:00:00 1000 mL via ORAL

## 2019-06-28 MED ORDER — MORPHINE SULFATE (PF) 4 MG/ML IV SOLN
4.0000 mg | Freq: Once | INTRAVENOUS | Status: AC
  Administered 2019-06-28: 16:00:00 4 mg via INTRAVENOUS
  Filled 2019-06-28: qty 1

## 2019-06-28 MED ORDER — IOHEXOL 300 MG/ML  SOLN
75.0000 mL | Freq: Once | INTRAMUSCULAR | Status: AC | PRN
  Administered 2019-06-28: 16:00:00 75 mL via INTRAVENOUS

## 2019-06-28 MED ORDER — FENTANYL CITRATE (PF) 100 MCG/2ML IJ SOLN
50.0000 ug | Freq: Once | INTRAMUSCULAR | Status: AC
  Administered 2019-06-28: 18:00:00 50 ug via INTRAVENOUS
  Filled 2019-06-28: qty 2

## 2019-06-28 NOTE — Telephone Encounter (Signed)
Patient called stating that she was in the ER on Tuesday with severe abdominal pain and Dx with an ileus.  She had a BM in the ER and was sent home. Today she states she feels the pain returning. It is mid abdomin and starting to radiate down to her pelvis as before.   She rates the pain at 2-3 but states she has not eaten because she knows it will make her pain worse.  She has not had another BM since the ER on Tuesday 2/9. She states they sent her home to take Miralax It has not worked and she has stared to try laxative. She states she has dry heaves.  She has had surgery that make it hard to vomit. Per protocol patient will go to ER for evaluation of her symptoms.  Care advice read to patient.  She verbalized understanding. Patient has follow up office appointment scheduled for Monday. She was advised to keep appointment time for now.  Reason for Disposition . Patient sounds very sick or weak to the triager  Answer Assessment - Initial Assessment Questions 1. LOCATION: "Where does it hurt?"      Mid stomach just under ribs 2. RADIATION: "Does the pain shoot anywhere else?" (e.g., chest, back)     Trying to radiate to lower stomach 3. ONSET: "When did the pain begin?" (e.g., minutes, hours or days ago)      Went to ER  Tuesday 4. SUDDEN: "Gradual or sudden onset?"     suddenly 5. PATTERN "Does the pain come and go, or is it constant?"    - If constant: "Is it getting better, staying the same, or worsening?"      (Note: Constant means the pain never goes away completely; most serious pain is constant and it progresses)     - If intermittent: "How long does it last?" "Do you have pain now?"     (Note: Intermittent means the pain goes away completely between bouts)     Constant 6. SEVERITY: "How bad is the pain?"  (e.g., Scale 1-10; mild, moderate, or severe)   - MILD (1-3): doesn't interfere with normal activities, abdomen soft and not tender to touch    - MODERATE (4-7): interferes with normal  activities or awakens from sleep, tender to touch    - SEVERE (8-10): excruciating pain, doubled over, unable to do any normal activities     2-3 7. RECURRENT SYMPTOM: "Have you ever had this type of abdominal pain before?" If so, ask: "When was the last time?" and "What happened that time?"      yes 8. CAUSE: "What do you think is causing the abdominal pain?"     Dx ileus 9. RELIEVING/AGGRAVATING FACTORS: "What makes it better or worse?" (e.g., movement, antacids, bowel movement)    unsure 10. OTHER SYMPTOMS: "Has there been any vomiting, diarrhea, constipation, or urine problems?"     Last BM Tuesday at hospital 11. PREGNANCY: "Is there any chance you are pregnant?" "When was your last menstrual period?"       N/A  Protocols used: ABDOMINAL PAIN - [facility]

## 2019-06-28 NOTE — ED Triage Notes (Signed)
Pt reports being seen on Tuesday and diagnosed with an ileus, pt reports being given an enema in the ER with a few bm's but nothing since, pt states that she has started hurting in her abd again, reports that she spoke with her primary care who advised her to come back to the ER to be rechecked, with the concern of a bowel blockage

## 2019-06-28 NOTE — ED Notes (Signed)
Pt to US at this time.

## 2019-06-28 NOTE — ED Provider Notes (Signed)
[facility] Emergency Department Provider Note  ____________________________________________   First MD Initiated Contact with Patient 06/28/19 1506     (approximate)  I have reviewed the triage vital signs and the nursing notes.  History  Chief Complaint Abdominal Pain and Constipation    HPI Tami Williams is a 54 y.o. female with history of GERD, status post Nissen, cholecystectomy, partial hysterectomy who presents to the emergency department for recurrent constipation and abdominal pain.  Patient was seen here on 2/9 for same symptoms, CT imaging consistent with ileus.  Received an enema with positive results, discharged from ED on MiraLAX.  Patient states since discharge on Tuesday she has not had any further bowel movements despite compliance with her prescribed MiraLAX (1 capful/daily) and other over-the-counter laxatives.  She is passing flatus.  She reports nausea and dry heaves, is unable to vomit due to the history of her Nissen.  No fevers.  She denies any dysuria or hematuria or history of nephrolithiasis.  Spoke to her PCP this AM who referred her to the ED for repeat evaluation, PCP concern for progression to obstruction.  Patient reports abdominal pain located periumbilically and in the right lower quadrant.  Currently moderate in severity. This pain is not as severe compared to 2/9.  No radiation.  No alleviating or aggravating components.  Has been unable to tolerate significant PO due to the pain.  She denies any recent changes to her diet or any new prescribed over-the-counter medications.   Past Medical Hx Past Medical History:  Diagnosis Date  . Acute epigastric pain   . GAD (generalized anxiety disorder)   . GERD (gastroesophageal reflux disease)   . Hypertension   . Non-cardiac chest pain     Problem List Patient Active Problem List   Diagnosis Date Noted  . Biliary dyskinesia 12/07/2016  . Perimenopausal symptoms 09/13/2016  .  Allergic rhinitis 03/20/2015  . Cold sore 03/20/2015  . Abnormal liver enzymes 03/20/2015  . 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 Hypertension 03/20/2015  . Diaphragmatic hernia 03/20/2015  . 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 Anxiety disorder 03/20/2015  . Acid reflux 09/23/2007  . Gastro-esophageal reflux disease without esophagitis 09/23/2007    Past Surgical Hx Past Surgical History:  Procedure Laterality Date  . 24 HOUR PH STUDY N/A 01/06/2016   Procedure: 24 HOUR PH STUDY;  Surgeon: Midge Minium, MD;  Location: [facility] ENDOSCOPY;  Service: Endoscopy;  Laterality: N/A;  . ABDOMINAL HYSTERECTOMY     due to endometriosis  . ESOPHAGEAL MANOMETRY N/A 01/06/2016   Procedure: ESOPHAGEAL MANOMETRY (EM);  Surgeon: Midge Minium, MD;  Location: [facility] ENDOSCOPY;  Service: Endoscopy;  Laterality: N/A;  . ESOPHAGOGASTRODUODENOSCOPY    . ESOPHAGOGASTRODUODENOSCOPY (EGD) WITH PROPOFOL N/A 12/02/2015   Procedure: ESOPHAGOGASTRODUODENOSCOPY (EGD) WITH PROPOFOL;  Surgeon: Scot Jun, MD;  Location: [facility] ENDOSCOPY;  Service: Endoscopy;  Laterality: N/A;  . FOOT SURGERY Left 04/2018  . NISSEN FUNDOPLICATION  2007   dr. Katrinka Blazing    Medications Prior to Admission medications   Medication Sig Start Date End Date Taking? Authorizing Provider  fluticasone (FLONASE) 50 MCG/ACT nasal spray Place 2 sprays into both nostrils daily. 03/19/19   Erasmo Downer, MD  lisinopril (ZESTRIL) 10 MG tablet TAKE 1 TABLET(10 MG) BY MOUTH DAILY 05/09/19   Margaretann Loveless, PA-C  ondansetron (ZOFRAN) 4 MG tablet Take 1 tablet (4 mg total) by mouth every 8 (eight) hours as needed. 06/06/19   Joycelyn Man  M, PA-C  pantoprazole (PROTONIX) 40 MG tablet TAKE 1 TABLET(40 MG) BY MOUTH DAILY 12/11/18   Margaretann Loveless, PA-C  PARoxetine (PAXIL) 10 MG tablet TAKE 1 TABLET(10 MG) BY MOUTH DAILY 06/12/19   Margaretann Loveless, PA-C  SYNTHROID 50 MCG tablet TK 1 T PO  QD 08/03/15   [provider]  traZODone (DESYREL) 50 MG tablet Take 1 tablet (50 mg total) by mouth at bedtime as needed for sleep. 03/12/19   Margaretann Loveless, PA-C    Allergies No known allergies  Family Hx Family History  Problem Relation Age of Onset  . Hypertension Mother   . Heart failure Mother        Defib & pacemaker implant   . Heart disease Mother   . Diabetes Mother   . Hypertension Father   . Healthy Brother   . Healthy Brother   . Breast cancer Neg Hx     Social Hx Social History   Tobacco Use  . Smoking status: Never Smoker  . Smokeless tobacco: Never Used  Substance Use Topics  . Alcohol use: Yes    Alcohol/week: 0.0 standard drinks    Comment: occasional  . Drug use: No     Review of Systems  Constitutional: Negative for fever, chills. Eyes: Negative for visual changes. ENT: Negative for sore throat. Cardiovascular: Negative for chest pain. Respiratory: Negative for shortness of breath. Gastrointestinal: + for nausea, abdominal pain.  Genitourinary: Negative for dysuria. Musculoskeletal: Negative for leg swelling. Skin: Negative for rash. Neurological: Negative for headaches.   Physical Exam  Vital Signs: ED Triage Vitals  Enc Vitals Group     BP 06/28/19 1109 119/76     Pulse Rate 06/28/19 1109 78     Resp 06/28/19 1109 16     Temp 06/28/19 1109 98 F (36.7 C)     Temp Source 06/28/19 1109 Oral     SpO2 06/28/19 1109 97 %     Weight 06/28/19 1110 115 lb (52.2 kg)     Height 06/28/19 1110 5\' 4"  (1.626 m)     Head Circumference --      Peak Flow --      Pain Score 06/28/19 1110 3     Pain Loc --      Pain Edu? --      Excl. in GC? --     Constitutional: Alert and oriented.  Head: Normocephalic. Atraumatic. Eyes: Conjunctivae clear. Sclera anicteric. Nose: No congestion. No rhinorrhea. Mouth/Throat: Wearing mask.  Neck: No stridor.   Cardiovascular: Normal rate, regular rhythm. Extremities well perfused.  2+  symmetric DP pulses bilaterally.  Toes warm and well perfused. Respiratory: Normal respiratory effort.  Lungs CTAB. Gastrointestinal: Soft.  TTP periumbilically, right lower quadrant, no rebound or guarding.  Bowel sounds present.  Non-distended. Old, well healed surgical scars.  Musculoskeletal: No lower extremity edema or swelling. No deformities. Neurologic:  Normal speech and language. No gross focal neurologic deficits are appreciated.  Skin: Skin is warm, dry and intact. No rash noted. Psychiatric: Mood and affect are appropriate for situation.    Radiology  CT: IMPRESSION:  1. As on the prior study, there is diffuse small bowel distension  without overt dilatation. Contrast material has migrated through  small bowel to the distal ileum but not yet entered the colon. There  is no abrupt transition zone to suggest mechanical obstruction.  Prominent colonic stool volume again noted. Imaging features today  are not frankly obstructive and may be  related to ileus/dysmotility.  Prominent stool volume could be seen in the setting of clinical  constipation.  2. Potential filling defect versus laminar flow of unopacified blood  in the right femoral vein. Lower extremity Doppler ultrasound  recommended to further evaluate.  3. Stable appearance of the biliary duct dilatation status post  cholecystectomy.   [place]: IMPRESSION: No femoropopliteal DVT nor evidence of DVT within the visualized calf veins. Specifically, no filling defects within the right common femoral vein to correspond with the CT abnormality. CT findings likely reflected laminar flow of unopacified blood.  If clinical symptoms are inconsistent or if there are persistent or worsening symptoms, further imaging (possibly involving the iliac veins) may be warranted.     Procedures  Procedure(s) performed (including critical care):  Procedures   Initial Impression / Assessment and Plan / ED Course  54 y.o. female  who presents to the ED for recurrent abdominal pain, constipation, nausea, dry heaves, as above.  Ddx: continued ileus, electrolyte abnormality, constipation, obstruction, appendicitis  Will evaluate with labs, repeat imaging  CT imaging not frankly obstructive, may be related to ileus/dysmotility. No abrupt transition zone of oral contrast to suggest mechanical obstruction. Electrolytes without actionable derangements.    Also, incidental right femoral vein finding on CT as noted above, will follow up with lower extremity Doppler, however very low suspicion for DVT - patient has no leg swelling, no leg or calf pain, no discoloration.   [place] negative. As such, patient stable for discharge. Will provide Rx for senna to assist with stimulation, continue MiraLAX, and advised PCP follow up. Patient agreeable.  Given return precautions.  Stable for discharge.   Final Clinical Impression(s) / ED Diagnosis  Final diagnoses:  Periumbilical abdominal pain  Abdominal pain, RLQ  Ileus ([place])       Note:  This document was prepared using Dragon voice recognition software and may include unintentional dictation errors.   Lilia Pro., MD 06/28/19 2014

## 2019-06-28 NOTE — Progress Notes (Signed)
Patient: Tami Williams Female    DOB: March 08, 1966   54 y.o.   MRN: [id number] Visit Date: 07/01/2019  Today's Provider: Mar Daring, PA-C   Chief Complaint  Patient presents with  . Follow-up    ER visit   Subjective:     HPI   Follow Up ER Visit  Patient is here for ER follow up.  She was recently seen at [facility] for Ileus, Abdominal pain, vomiting on 02/09 . Treatment for this included she was given an enema in the ER and then told to start OTC Miralax. Patient went back on the ED on 02/12 and dx with continued ileus.  Rx for senna to assist with stimulation, continue MiraLAX, and advised PCP follow up. She reports good compliance with treatment. She reports this condition is Unchanged. She still hurting, nauseated. She reports that's she has been to the restroom four times but is not a lot "very little" She has been trying to eat a liquid to bland diet. Even liquids cause mild abdominal pain.  She does mention that she has only been using the [facility] for BM, because they did not mention continuing Miralax at the ER.   ---------------------------------------------------------------------------------   Wt Readings from Last 3 Encounters:  07/01/19 119 lb 6.4 oz (54.2 kg)  06/28/19 115 lb (52.2 kg)  06/25/19 117 lb (53.1 kg)    Allergies  Allergen Reactions  . No Known Allergies      Current Outpatient Medications:  .  fluticasone (FLONASE) 50 MCG/ACT nasal spray, Place 2 sprays into both nostrils daily., Disp: 16 g, Rfl: 11 .  lisinopril (ZESTRIL) 10 MG tablet, TAKE 1 TABLET(10 MG) BY MOUTH DAILY, Disp: 90 tablet, Rfl: 1 .  ondansetron (ZOFRAN) 4 MG tablet, Take 1 tablet (4 mg total) by mouth every 8 (eight) hours as needed., Disp: 20 tablet, Rfl: 5 .  pantoprazole (PROTONIX) 40 MG tablet, TAKE 1 TABLET(40 MG) BY MOUTH DAILY, Disp: 90 tablet, Rfl: 3 .  PARoxetine (PAXIL) 10 MG tablet, TAKE 1 TABLET(10 MG) BY MOUTH DAILY, Disp: 30 tablet, Rfl: 1 .   senna (SENOKOT) 8.6 MG TABS tablet, Take 2 tablets (17.2 mg total) by mouth 2 (two) times daily for 14 days., Disp: 56 tablet, Rfl: 0 .  SYNTHROID 50 MCG tablet, TK 1 T PO QD, Disp: , Rfl: 6 .  traZODone (DESYREL) 50 MG tablet, Take 1 tablet (50 mg total) by mouth at bedtime as needed for sleep., Disp: 90 tablet, Rfl: 1  Review of Systems  Constitutional: Positive for appetite change. Negative for chills, diaphoresis, fatigue and fever.  Respiratory: Negative for cough, chest tightness and shortness of breath.   Cardiovascular: Negative for chest pain, palpitations and leg swelling.  Gastrointestinal: Positive for abdominal distention, abdominal pain, constipation, nausea and vomiting (dry heaves; unable to vomit since nissen fundoplication).  Genitourinary: Negative.   Musculoskeletal: Positive for back pain.  Neurological: Negative for dizziness, weakness and light-headedness.    Social History   Tobacco Use  . Smoking status: Never Smoker  . Smokeless tobacco: Never Used  Substance Use Topics  . Alcohol use: Yes    Alcohol/week: 0.0 standard drinks    Comment: occasional      Objective:   BP 111/77 (BP Location: Left Arm, Patient Position: Sitting, Cuff Size: Normal)   Pulse 61   Temp (!) 96.9 F (36.1 C) (Temporal)   Resp 16   Wt 119 lb 6.4 oz (54.2 kg)  BMI 20.49 kg/m  Vitals:   07/01/19 1325  BP: 111/77  Pulse: 61  Resp: 16  Temp: (!) 96.9 F (36.1 C)  TempSrc: Temporal  Weight: 119 lb 6.4 oz (54.2 kg)  Body mass index is 20.49 kg/m.   Physical Exam Constitutional:      General: She is not in acute distress.    Appearance: Normal appearance. She is well-developed and normal weight. She is not ill-appearing or diaphoretic.  Cardiovascular:     Rate and Rhythm: Normal rate and regular rhythm.     Pulses: Normal pulses.     Heart sounds: Normal heart sounds. No murmur. No friction rub. No gallop.   Pulmonary:     Effort: Pulmonary effort is normal. No  respiratory distress.     Breath sounds: Normal breath sounds. No wheezing or rales.  Abdominal:     General: Abdomen is flat. Bowel sounds are increased. There is no distension.     Palpations: Abdomen is soft. There is no mass.     Tenderness: There is generalized abdominal tenderness. There is guarding. There is no rebound.     Comments: Had hyperactive bowel sounds heard throughout  Skin:    General: Skin is warm and dry.  Neurological:     Mental Status: She is alert and oriented to person, place, and time.      No results found for any visits on 07/01/19.     Assessment & Plan    1. Ileus [facility]) Spoke with Dr. Sullivan Lone and he agreed for [place] to facilitate to get her back in with the surgeon that had done her Nissen Fundoplication and cholecystectomy, which was Dr. Barnetta Chapel, General Surgeon at [facility]. Call was placed and appt was given for 07/11/19 and she was placed on a cancellation list. Advised to continue liquid diet and bland, soft diet as tolerated. She has Zofran on hand still and advised to continue that. Also advised to continue SennaKot and add Miralax back in. May increase to 2 caps of Miralax daily if needed. Was hopeful that the bowel may be starting to become active again since she had bowel sounds throughout. I am concerned she may have some adhesions as the source of this as there is no other known source that may be causing an ileus. She is to call if symptoms worsen.     I spent approximately 60 minutes with the patient today. Spent approx 25-30 minutes discussing with other providers, chart review and contacting her general surgeon at [facility]. Over 50% of this time was spent with counseling and educating the patient.   Margaretann Loveless, PA-C  [facility]

## 2019-06-28 NOTE — Telephone Encounter (Signed)
FYI

## 2019-06-28 NOTE — Discharge Instructions (Addendum)
Thank you for letting us take care of you in the emergency department today.   Please continue to take any regular, prescribed medications.   New medications we have prescribed:  - Senna - this can also be obtained over the counter if it is more financial feasible. Take two tablets twice a day until your bowel habits return to normal.   Stay well hydrated and continue to eat a well rounded healthy diet.   Please follow up with: - Your primary care doctor to review your ER visit and follow up on your symptoms.   Please return to the ER for any new or worsening symptoms.

## 2019-07-01 ENCOUNTER — Ambulatory Visit (INDEPENDENT_AMBULATORY_CARE_PROVIDER_SITE_OTHER): Payer: BC Managed Care – PPO | Admitting: Physician Assistant

## 2019-07-01 ENCOUNTER — Encounter: Payer: Self-pay | Admitting: Physician Assistant

## 2019-07-01 ENCOUNTER — Other Ambulatory Visit: Payer: Self-pay

## 2019-07-01 VITALS — BP 111/77 | HR 61 | Temp 96.9°F | Resp 16 | Wt 119.4 lb

## 2019-07-01 DIAGNOSIS — K567 Ileus, unspecified: Secondary | ICD-10-CM

## 2019-07-03 ENCOUNTER — Encounter: Payer: Self-pay | Admitting: Physician Assistant

## 2019-07-03 ENCOUNTER — Telehealth: Payer: Self-pay | Admitting: Physician Assistant

## 2019-07-03 NOTE — Telephone Encounter (Signed)
Pt stated she is having pain in her lower back, with more attention the right side. She is supposed to see a Careers adviser on tomorrow but she would like to know from [place] if she is okay to wait until tomorrow due to possible diagnosis that was discussed with Victorino Dike at appt on 07/01/19. Please advise.

## 2019-07-04 ENCOUNTER — Encounter: Payer: Self-pay | Admitting: Physician Assistant

## 2019-07-04 DIAGNOSIS — K567 Ileus, unspecified: Secondary | ICD-10-CM | POA: Insufficient documentation

## 2019-07-04 MED ORDER — DSS 100 MG PO CAPS
100.00 | ORAL_CAPSULE | ORAL | Status: DC
Start: ? — End: 2019-07-04

## 2019-07-04 MED ORDER — PANTOPRAZOLE SODIUM 40 MG PO TBEC
40.00 | DELAYED_RELEASE_TABLET | ORAL | Status: DC
Start: 2019-07-04 — End: 2019-07-04

## 2019-07-04 MED ORDER — BISACODYL 5 MG PO TBEC
10.00 | DELAYED_RELEASE_TABLET | ORAL | Status: DC
Start: ? — End: 2019-07-04

## 2019-07-04 MED ORDER — KETOROLAC TROMETHAMINE 15 MG/ML IJ SOLN
15.00 | INTRAMUSCULAR | Status: DC
Start: ? — End: 2019-07-04

## 2019-07-04 MED ORDER — DEXTROMETHORPHAN-GUAIFENESIN 10-100 MG/5ML PO LIQD
5.00 | ORAL | Status: DC
Start: ? — End: 2019-07-04

## 2019-07-04 MED ORDER — SORBITOL 70 % PO SOLN
30.00 | ORAL | Status: DC
Start: ? — End: 2019-07-04

## 2019-07-04 MED ORDER — DIPHENHYDRAMINE HCL 25 MG PO CAPS
25.00 | ORAL_CAPSULE | ORAL | Status: DC
Start: ? — End: 2019-07-04

## 2019-07-04 MED ORDER — ACETAMINOPHEN 325 MG PO TABS
650.00 | ORAL_TABLET | ORAL | Status: DC
Start: ? — End: 2019-07-04

## 2019-07-04 MED ORDER — HEPARIN SODIUM (PORCINE) 5000 UNIT/ML IJ SOLN
5000.00 | INTRAMUSCULAR | Status: DC
Start: 2019-07-04 — End: 2019-07-04

## 2019-07-04 MED ORDER — LEVOTHYROXINE SODIUM 25 MCG PO TABS
50.00 | ORAL_TABLET | ORAL | Status: DC
Start: 2019-07-05 — End: 2019-07-04

## 2019-07-04 MED ORDER — POLYETHYLENE GLYCOL 3350 17 GM/SCOOP PO POWD
17.00 | ORAL | Status: DC
Start: 2019-07-05 — End: 2019-07-04

## 2019-07-04 MED ORDER — ONDANSETRON HCL 4 MG/2ML IJ SOLN
4.00 | INTRAMUSCULAR | Status: DC
Start: ? — End: 2019-07-04

## 2019-07-04 MED ORDER — TRAZODONE HCL 50 MG PO TABS
50.00 | ORAL_TABLET | ORAL | Status: DC
Start: 2019-07-04 — End: 2019-07-04

## 2019-07-04 MED ORDER — HYDRALAZINE HCL 10 MG PO TABS
10.00 | ORAL_TABLET | ORAL | Status: DC
Start: ? — End: 2019-07-04

## 2019-07-04 MED ORDER — ALUM & MAG HYDROXIDE-SIMETH 200-200-20 MG/5ML PO SUSP
30.00 | ORAL | Status: DC
Start: ? — End: 2019-07-04

## 2019-07-04 MED ORDER — PAROXETINE HCL 10 MG PO TABS
10.00 | ORAL_TABLET | ORAL | Status: DC
Start: 2019-07-05 — End: 2019-07-04

## 2019-07-04 MED ORDER — ONDANSETRON 4 MG PO TBDP
4.00 | ORAL_TABLET | ORAL | Status: DC
Start: ? — End: 2019-07-04

## 2019-07-04 MED ORDER — LACTATED RINGERS IV SOLN
INTRAVENOUS | Status: DC
Start: ? — End: 2019-07-04

## 2019-07-04 MED ORDER — MELATONIN 3 MG PO TABS
3.00 | ORAL_TABLET | ORAL | Status: DC
Start: ? — End: 2019-07-04

## 2019-07-04 MED ORDER — LISINOPRIL 5 MG PO TABS
10.00 | ORAL_TABLET | ORAL | Status: DC
Start: 2019-07-05 — End: 2019-07-04

## 2019-07-04 NOTE — Patient Instructions (Signed)
Ileus  Ileus is a condition that happens when the intestines, which are also called bowels, stop working correctly. The intestines are hollow organs that digest food after the food leaves the stomach. These organs are long, muscular tubes that connect the stomach to the rectum. When ileus occurs, the muscular contractions that cause food to move through the intestines do not happen as they normally would. If the intestines stop working, food cannot pass through to get digested. This condition is a serious problem that usually requires hospitalization. It can cause symptoms such as nausea, abdominal pain, and bloating. Ileus can last from a few hours to a few days. What are the causes? This condition may be caused by:  Surgery on the abdomen.  An infection or inflammation in the abdomen. This includes inflammation of the lining of the abdomen (peritonitis).  Infection or inflammation in other parts of the body, such as pneumonia or pancreatitis.  Passage of gallstones or kidney stones.  Damage to the nerves or blood vessels that go to the intestines.  A collection of blood within the abdominal cavity.  Imbalance in the salts in the blood (electrolytes).  Injury to the brain or spinal cord.  Medicines. Many medicines, including strong pain medicines, can cause ileus or make it worse. If the intestines stop working because of a blockage, that is a different condition that is called a bowel obstruction. What are the signs or symptoms? Symptoms of this condition include:  Bloating of the abdomen.  Pain or discomfort in the abdomen.  Poor appetite.  Nausea and vomiting.  Lack of normal bowel sounds, such as "growling" in the stomach. How is this diagnosed? This condition may be diagnosed with:  A physical exam and medical history.  X-rays or a CT scan of the abdomen. You may also have other tests to help find the cause of the condition. How is this treated? This condition may  be treated by:  Resting the intestines until they start to work again. This is often done by: ? Stopping oral intake of food and drink. You will be given fluid through an IV to prevent dehydration. ? Placing a small tube (nasogastric tube or NG tube) that is passed through your nose and into your stomach. The tube is attached to a suction device and keeps the stomach emptied out. This allows the bowels to rest and helps to reduce nausea and vomiting.  Correcting any electrolyte imbalance by giving supplements in the IV fluid.  Stopping any medicines that might make ileus worse.  Treating any condition that may have caused ileus. Follow these instructions at home: Eating and drinking   Follow instructions from your health care provider about: ? What to eat and drink. You may be told to start eating a bland diet. Over time, you may slowly resume a more normal, healthy diet. ? How much to eat and drink. You should eat small meals often and stop eating when you feel full.  Avoid alcohol. General instructions  Take over-the-counter and prescription medicines only as told by your health care provider.  Rest as told by your health care provider.  Avoid sitting for a long time without moving. Get up to take short walks every 1-2 hours. Ask for help if you feel weak or unsteady.  Keep all follow-up visits as told by your health care provider. This is important. Contact a health care provider if:  You have nausea, vomiting, or abdominal discomfort.  You have a fever. Get help   right away if:  You have severe abdominal pain or bloating.  You cannot eat or drink without vomiting. Summary  Ileus is a condition that happens when the intestines, which are also called bowels, stop working correctly.  When ileus occurs, the muscular contractions that cause food to move through the intestines do not happen as they normally would.  Ileus can cause symptoms such as nausea, abdominal pain, and  bloating.  Treatment may involve getting IV fluids and having a nasogastric tube placed to keep your stomach emptied out until the intestines start working again. This information is not intended to replace advice given to you by your health care provider. Make sure you discuss any questions you have with your health care provider. Document Revised: 08/28/2017 Document Reviewed: 08/28/2017 Elsevier Patient Education  2020 [employer]  

## 2019-07-15 ENCOUNTER — Ambulatory Visit: Payer: BC Managed Care – PPO | Admitting: Physician Assistant

## 2019-07-15 ENCOUNTER — Other Ambulatory Visit: Payer: Self-pay

## 2019-07-15 ENCOUNTER — Encounter: Payer: Self-pay | Admitting: Physician Assistant

## 2019-07-15 VITALS — BP 110/74 | HR 66 | Temp 96.8°F | Wt 121.0 lb

## 2019-07-15 DIAGNOSIS — K567 Ileus, unspecified: Secondary | ICD-10-CM | POA: Diagnosis not present

## 2019-07-15 DIAGNOSIS — N951 Menopausal and female climacteric states: Secondary | ICD-10-CM

## 2019-07-15 DIAGNOSIS — K449 Diaphragmatic hernia without obstruction or gangrene: Secondary | ICD-10-CM

## 2019-07-15 DIAGNOSIS — G479 Sleep disorder, unspecified: Secondary | ICD-10-CM

## 2019-07-15 DIAGNOSIS — K5904 Chronic idiopathic constipation: Secondary | ICD-10-CM | POA: Diagnosis not present

## 2019-07-15 MED ORDER — PAROXETINE HCL 10 MG PO TABS: 10.0000 mg | ORAL_TABLET | Freq: Every day | ORAL | 1 refills | Status: DC

## 2019-07-15 MED ORDER — PANTOPRAZOLE SODIUM 40 MG PO TBEC: 40.0000 mg | DELAYED_RELEASE_TABLET | Freq: Two times a day (BID) | ORAL | 1 refills | Status: DC

## 2019-07-15 MED ORDER — TRAZODONE HCL 50 MG PO TABS: 50.0000 mg | ORAL_TABLET | Freq: Every evening | ORAL | 1 refills | Status: DC | PRN

## 2019-07-15 NOTE — Patient Instructions (Signed)

## 2019-07-15 NOTE — Progress Notes (Signed)
Patient: Tami Williams Female    DOB: Apr 05, 1966   54 y.o.   MRN: [id number] Visit Date: 07/15/2019  Today's Provider: Margaretann Loveless, PA-C   No chief complaint on file.  Subjective:     HPI   Follow up Hospitalization  Patient was admitted to [facility]  on 07/03/2019 and discharged on 07/05/2019. She was treated for Illeus. Treatment for this included NPO, enema, Miralax 1 capful BID prescribed. Telephone follow up was not done. She reports excellent compliance with treatment. She reports this condition is Improved. Pain has resolved but only having watery diarrhea with occasional fecal matter, no true BM. Able to maintain PO intake without pain. Has colonoscopy scheduled for May and f/u with GI at [facility] in April.  ------------------------------------------------------------------------------------      Allergies  Allergen Reactions  . No Known Allergies      Current Outpatient Medications:  .  fluticasone (FLONASE) 50 MCG/ACT nasal spray, Place 2 sprays into both nostrils daily., Disp: 16 g, Rfl: 11 .  lisinopril (ZESTRIL) 10 MG tablet, TAKE 1 TABLET(10 MG) BY MOUTH DAILY, Disp: 90 tablet, Rfl: 1 .  ondansetron (ZOFRAN) 4 MG tablet, Take 1 tablet (4 mg total) by mouth every 8 (eight) hours as needed., Disp: 20 tablet, Rfl: 5 .  pantoprazole (PROTONIX) 40 MG tablet, TAKE 1 TABLET(40 MG) BY MOUTH DAILY, Disp: 90 tablet, Rfl: 3 .  PARoxetine (PAXIL) 10 MG tablet, TAKE 1 TABLET(10 MG) BY MOUTH DAILY, Disp: 30 tablet, Rfl: 1 .  SYNTHROID 50 MCG tablet, TK 1 T PO QD, Disp: , Rfl: 6 .  traZODone (DESYREL) 50 MG tablet, Take 1 tablet (50 mg total) by mouth at bedtime as needed for sleep., Disp: 90 tablet, Rfl: 1  Review of Systems  Constitutional: Negative.   Respiratory: Negative.   Cardiovascular: Negative.   Gastrointestinal: Positive for diarrhea. Negative for abdominal distention, abdominal pain, anal bleeding, blood in stool, constipation, nausea,  rectal pain and vomiting.  Neurological: Positive for light-headedness (occasionally). Negative for dizziness and headaches.    Social History   Tobacco Use  . Smoking status: Never Smoker  . Smokeless tobacco: Never Used  Substance Use Topics  . Alcohol use: Yes    Alcohol/week: 0.0 standard drinks    Comment: occasional      Objective:   There were no vitals taken for this visit. There were no vitals filed for this visit.There is no height or weight on file to calculate BMI.   Physical Exam Vitals reviewed.  Constitutional:      General: She is not in acute distress.    Appearance: Normal appearance. She is well-developed. She is not diaphoretic.  Cardiovascular:     Rate and Rhythm: Normal rate and regular rhythm.     Heart sounds: Normal heart sounds. No murmur. No friction rub. No gallop.   Pulmonary:     Effort: Pulmonary effort is normal. No respiratory distress.     Breath sounds: Normal breath sounds. No wheezing or rales.  Abdominal:     General: Abdomen is flat. Bowel sounds are normal. There is no distension.     Palpations: Abdomen is soft. There is no mass.     Tenderness: There is abdominal tenderness in the right lower quadrant. There is no guarding or rebound. Negative signs include McBurney's sign.  Skin:    General: Skin is warm and dry.  Neurological:     Mental Status: She is alert and oriented  to person, place, and time.      No results found for any visits on 07/15/19.     Assessment & Plan    1. Chronic idiopathic constipation Felt to be source of ileus. Advised to continue Miralax 1 capful BID. Was given a sample of Linzess 237mcg today in the office to try. If this works better will send in Rx. She agrees and will notify me via mychart if more successful with maintaining BM.   2. Ileus ([place]) Currently resolved.   3. Hot flash, menopausal Stable. Diagnosis pulled for medication refill. Continue current medical treatment plan. -  PARoxetine (PAXIL) 10 MG tablet; Take 1 tablet (10 mg total) by mouth daily.  Dispense: 90 tablet; Refill: 1  4. Diaphragmatic hernia without obstruction and without gangrene Having some mild abdominal pain with certain foods again. Will increase Protonix to BID dosing as this is what they were giving her in the hospital to see if that helps symptoms.  - pantoprazole (PROTONIX) 40 MG tablet; Take 1 tablet (40 mg total) by mouth 2 (two) times daily.  Dispense: 180 tablet; Refill: 1  5. Difficulty sleeping Would like to be able to increase to 2 tabs at bedtime when needed. Refilled as below.  - traZODone (DESYREL) 50 MG tablet; Take 1-2 tablets (50-100 mg total) by mouth at bedtime as needed for sleep.  Dispense: 180 tablet; Refill: [place], PA-C  [facility]

## 2019-08-01 ENCOUNTER — Telehealth: Payer: Self-pay

## 2019-08-01 DIAGNOSIS — N951 Menopausal and female climacteric states: Secondary | ICD-10-CM

## 2019-08-01 MED ORDER — PAROXETINE HCL 20 MG PO TABS: 20.0000 mg | ORAL_TABLET | Freq: Every day | ORAL | 1 refills | Status: DC

## 2019-08-01 NOTE — Telephone Encounter (Signed)
Paxil 20mg  sent in.  As for the Linzess , is she wanting me to send in a Rx to keep her having BM? Does she want to stop and f/u with GI as I think that is coming soon?

## 2019-08-01 NOTE — Telephone Encounter (Signed)
Copied from CRM [id number]. Topic: General - Other >> Aug 01, 2019 11:14 AM Marylen Ponto wrote: Reason for CRM: Pt stated she was given samples of a medication to help with bowel movement but it made the problem worse. Pt stated she is not able to have bowel movement without medication. Pt stated she has increased the PARoxetine (PAXIL) 10 MG tablet to 2 tablets so she will be needing a refill soon.

## 2019-08-27 ENCOUNTER — Telehealth: Payer: Self-pay

## 2019-08-27 NOTE — Telephone Encounter (Signed)
Copied from CRM [id number]. Topic: General - Inquiry >> Aug 27, 2019  9:34 AM Daphine Deutscher D wrote: Reason for CRM: Pt called saying she has been having some depression she thinks in the last couple days.  She would like Boneta Lucks or her nurse to call her back.  Pt's CB# [phone]. She has been thinking about her dad who passed away in 03-29-23.

## 2019-08-27 NOTE — Telephone Encounter (Signed)
Pt needs an office visit.Marland KitchenMarland Kitchen   Apt made for 08/30/2019 at 2pm  Thanks,   -Vernona Rieger

## 2019-08-29 NOTE — Progress Notes (Signed)
Established patient visit     Patient: Tami Williams   DOB: [birth year]   54 y.o. Female  MRN: [id number] Visit Date: 08/30/2019  Today's healthcare provider: Mar Daring, PA-C  Subjective:    Chief Complaint  Patient presents with  . Depression   HPI  Depression: Patient complains of depression. Reports that she is having a hard time coming to terms with her dad passing. Reports that for the past three days have been the worse. Associated symptoms: difficult concentrating ,not sleeping well, night sweats, hot flashes, headache. She has a lot of stress. Doesn't know if this is causing her blood pressure to go up high. Her father lived with her and is hitting her hard, she is having guilt emotions.  Depression screen [facility] 2/9 08/30/2019 10/15/2018 06/15/2018  Decreased Interest 0 0 0  Down, Depressed, Hopeless 0 0 0  PHQ - 2 Score 0 0 0  Altered sleeping - 3 3  Tired, decreased energy - 1 0  Change in appetite - 0 0  Feeling bad or failure about yourself  - 0 0  Trouble concentrating - 0 0  Moving slowly or fidgety/restless - 0 0  Suicidal thoughts - 0 0  PHQ-9 Score - 4 3  Difficult doing work/chores - Not difficult at all Not difficult at all    -----------------------------------------------------------------------------------------  Patient Active Problem List   Diagnosis Date Noted  . Ileus ([place]) 07/04/2019  . Biliary dyskinesia 12/07/2016  . Perimenopausal symptoms 09/13/2016  . Allergic rhinitis 03/20/2015  . Cold sore 03/20/2015  . Abnormal liver enzymes 03/20/2015  . 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 Hypertension 03/20/2015  . Diaphragmatic hernia 03/20/2015  . 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 Anxiety disorder 03/20/2015  . Acid reflux 09/23/2007  . Gastro-esophageal reflux disease without esophagitis 09/23/2007   Past Medical History:  Diagnosis Date  . Acute epigastric pain   . GAD (generalized  anxiety disorder)   . GERD (gastroesophageal reflux disease)   . Hypertension   . Non-cardiac chest pain        Medications: Outpatient Medications Prior to Visit  Medication Sig  . esomeprazole (NEXIUM) 40 MG capsule Take by mouth.  . fluticasone (FLONASE) 50 MCG/ACT nasal spray Place 2 sprays into both nostrils daily.  Marland Kitchen lactulose (CHRONULAC) 10 GM/15ML solution Take by mouth.  Marland Kitchen lisinopril (ZESTRIL) 10 MG tablet TAKE 1 TABLET(10 MG) BY MOUTH DAILY  . ondansetron (ZOFRAN) 4 MG tablet Take 1 tablet (4 mg total) by mouth every 8 (eight) hours as needed.  Marland Kitchen SYNTHROID 50 MCG tablet TK 1 T PO QD  . traZODone (DESYREL) 50 MG tablet Take 1-2 tablets (50-100 mg total) by mouth at bedtime as needed for sleep.  . [DISCONTINUED] PARoxetine (PAXIL) 20 MG tablet Take 1 tablet (20 mg total) by mouth daily.  . [DISCONTINUED] pantoprazole (PROTONIX) 40 MG tablet Take 1 tablet (40 mg total) by mouth 2 (two) times daily.   No facility-administered medications prior to visit.    Review of Systems  Constitutional: Positive for fatigue.  Respiratory: Negative.   Cardiovascular: Negative.   Gastrointestinal: Negative.   Psychiatric/Behavioral: Positive for agitation, dysphoric mood and sleep disturbance. Negative for self-injury and suicidal ideas. The patient is nervous/anxious.     Last CBC Lab Results  Component Value Date   WBC 4.6 06/28/2019   HGB 12.6 06/28/2019   HCT 38.2 06/28/2019   MCV 93.2 06/28/2019   MCH 30.7 06/28/2019  RDW 13.0 06/28/2019   PLT 269 06/28/2019   Last metabolic panel Lab Results  Component Value Date   GLUCOSE 100 (H) 06/28/2019   NA 138 06/28/2019   K 4.7 06/28/2019   CL 98 06/28/2019   CO2 30 06/28/2019   BUN 13 06/28/2019   CREATININE 0.73 06/28/2019   GFRNONAA >60 06/28/2019   GFRAA >60 06/28/2019   CALCIUM 9.5 06/28/2019   PROT 7.1 06/28/2019   ALBUMIN 4.2 06/28/2019   LABGLOB 2.4 06/15/2018   AGRATIO 2.0 06/15/2018   BILITOT 0.5 06/28/2019     ALKPHOS 99 06/28/2019   AST 36 06/28/2019   ALT 44 06/28/2019   ANIONGAP 10 06/28/2019        Objective:    BP (!) 143/88 (BP Location: Left Arm, Patient Position: Sitting, Cuff Size: Normal)   Pulse 61   Temp (!) 97.2 F (36.2 C) (Temporal)   Resp 16   Wt 116 lb 3.2 oz (52.7 kg)   BMI 19.95 kg/m  BP Readings from Last 3 Encounters:  08/30/19 (!) 143/88  07/15/19 110/74  07/01/19 111/77   Wt Readings from Last 3 Encounters:  08/30/19 116 lb 3.2 oz (52.7 kg)  07/15/19 121 lb (54.9 kg)  07/01/19 119 lb 6.4 oz (54.2 kg)      Physical Exam Vitals reviewed.  Constitutional:      General: She is not in acute distress.    Appearance: Normal appearance. She is well-developed and normal weight. She is not ill-appearing.  HENT:     Head: Normocephalic and atraumatic.  Pulmonary:     Effort: Pulmonary effort is normal. No respiratory distress.  Musculoskeletal:     Cervical back: Normal range of motion and neck supple.  Neurological:     Mental Status: She is alert.  Psychiatric:        Attention and Perception: Attention and perception normal.        Mood and Affect: Mood is anxious and depressed. Affect is tearful.        Speech: Speech normal.        Behavior: Behavior normal. Behavior is cooperative.        Thought Content: Thought content normal.        Cognition and Memory: Cognition and memory normal.        Judgment: Judgment normal.      No results found for any visits on 08/30/19.    Assessment & Plan:    1. Grief reaction Change paroxetine to venlafaxine as below. Add alprazolam for prn needs. Starting grief counseling on Monday. I will f/u with her in 4-6 weeks.  - venlafaxine XR (EFFEXOR XR) 37.5 MG 24 hr capsule; Take 1 capsule (37.5 mg total) by mouth daily with breakfast.  Dispense: 30 capsule; Refill: 1 - ALPRAZolam (XANAX) 0.25 MG tablet; Take 1 tablet (0.25 mg total) by mouth 2 (two) times daily as needed for anxiety.  Dispense: 30 tablet;  Refill: 0  2. Menopausal syndrome (hot flashes) Was on paroxetine but GI feels may be contributing to constipation. Will change to venlafaxine and see if any improvements and make sure tolerated well.  - venlafaxine XR (EFFEXOR XR) 37.5 MG 24 hr capsule; Take 1 capsule (37.5 mg total) by mouth daily with breakfast.  Dispense: 30 capsule; Refill: 1   Return in about 4 weeks (around 09/27/2019), or if symptoms worsen or fail to improve, for giref.     Delmer Islam, PA-C, have reviewed all documentation for this  visit. The documentation on 08/30/19 for the exam, diagnosis, procedures, and orders are all accurate and complete.    Reine Just  [facility] [phone] (phone) [phone] (fax)  [facility]

## 2019-08-30 ENCOUNTER — Encounter: Payer: Self-pay | Admitting: Physician Assistant

## 2019-08-30 ENCOUNTER — Other Ambulatory Visit: Payer: Self-pay

## 2019-08-30 ENCOUNTER — Ambulatory Visit: Payer: BC Managed Care – PPO | Admitting: Physician Assistant

## 2019-08-30 VITALS — BP 143/88 | HR 61 | Temp 97.2°F | Resp 16 | Wt 116.2 lb

## 2019-08-30 DIAGNOSIS — F4321 Adjustment disorder with depressed mood: Secondary | ICD-10-CM | POA: Diagnosis not present

## 2019-08-30 DIAGNOSIS — N951 Menopausal and female climacteric states: Secondary | ICD-10-CM | POA: Diagnosis not present

## 2019-08-30 MED ORDER — VENLAFAXINE HCL ER 37.5 MG PO CP24: 37.5000 mg | ORAL_CAPSULE | Freq: Every day | ORAL | 1 refills | Status: DC

## 2019-08-30 MED ORDER — ALPRAZOLAM 0.25 MG PO TABS: 0.2500 mg | ORAL_TABLET | Freq: Two times a day (BID) | ORAL | 0 refills | Status: DC | PRN

## 2019-08-30 NOTE — Patient Instructions (Addendum)
Complicated Grief Grief is a normal response to the death of someone close to you. Feelings of fear, anger, and guilt can affect almost everyone who loses a loved one. It is also common to have symptoms of depression while you are grieving. These include problems with sleep, loss of appetite, and lack of energy. They may last for weeks or months after a loss. Complicated grief is different from normal grief or depression. Normal grieving involves sadness and feelings of loss, but those feelings get better and heal over time. Complicated grief is a severe type of grief that lasts for a long time, usually for several months to a year or longer. It interferes with your ability to function normally. Complicated grief may require treatment from a mental health care provider. What are the causes? The cause of this condition is not known. It is not clear why some people continue to struggle with grief and others do not. What increases the risk? You are more likely to develop this condition if:  The death of your loved one was sudden or unexpected.  The death of your loved one was due to a violent event.  Your loved one died from suicide.  Your loved one was a child or a young person.  You were very close to your loved one, or you were dependent on him or her.  You have a history of depression or anxiety. What are the signs or symptoms? Symptoms of this condition include:  Feeling disbelief or having a lack of emotion (numbness).  Being unable to enjoy good memories of your loved one.  Needing to avoid anything or anyone that reminds you of your loved one.  Being unable to stop thinking about the death.  Feeling intense anger or guilt.  Feeling alone and hopeless.  Feeling that your life is meaningless and empty.  Losing the desire to move on with your life. How is this diagnosed? This condition may be diagnosed based on:  Your symptoms. Complicated grief will be diagnosed if you have  ongoing symptoms of grief for 6-12 months or longer.  The effect of symptoms on your life. You may be diagnosed with this condition if your symptoms are interfering with your ability to live your life. Your health care provider may recommend that you see a mental health care provider. Many symptoms of depression are similar to the symptoms of complicated grief. It is important to be evaluated for complicated grief along with other mental health conditions. How is this treated? This condition is most commonly treated with talk therapy. This therapy is offered by a mental health specialist (psychiatrist). During therapy:  You will learn healthy ways to cope with the loss of your loved one.  Your mental health care provider may recommend antidepressant medicines. Follow these instructions at home: Lifestyle   Take care of yourself. ? Eat on a regular basis, and maintain a healthy diet. Eat plenty of fruits, vegetables, lean protein, and whole grains. ? Try to get some exercise each day. Aim for 30 minutes of exercise on most days of the week. ? Keep a consistent sleep schedule. Try to get 8 or more hours of sleep each night. ? Start doing the things that you used to enjoy.  Do not use drugs or alcohol to ease your symptoms.  Spend time with friends and loved ones. General instructions  Take over-the-counter and prescription medicines only as told by your health care provider.  Consider joining a grief (bereavement) support group   to help you deal with your loss.  Keep all follow-up visits as told by your health care provider. This is important. Contact a health care provider if:  Your symptoms prevent you from functioning normally.  Your symptoms do not get better with treatment. Get help right away if:  You have serious thoughts about hurting yourself or someone else.  You have suicidal feelings. If you ever feel like you may hurt yourself or others, or have thoughts about taking  your own life, get help right away. You can go to your nearest emergency department or call:  Your local emergency services (911 in the [place]  A suicide crisis helpline, such as the National Suicide Prevention Lifeline at (773)135-5673. This is open 24 hours a day. Summary  Complicated grief is a severe type of grief that lasts for a long time. This grief is not likely to go away on its own. Get the help you need.  Some griefs are more difficult than others and can cause this condition. You may need a certain type of treatment to help you recover if the loss of your loved one was sudden, violent, or due to suicide.  You may feel guilty about moving on with your life. Getting help does not mean that you are forgetting your loved one. It means that you are taking care of yourself.  Complicated grief is best treated with talk therapy. Medicines may also be prescribed.  Seek the help you need, and find support that will help you recover. This information is not intended to replace advice given to you by your health care provider. Make sure you discuss any questions you have with your health care provider. Document Revised: 04/14/2017 Document Reviewed: 02/15/2017 Elsevier Patient Education  2020 Elsevier Inc.  Venlafaxine extended-release capsules What is this medicine? VENLAFAXINE(VEN la fax een) is used to treat depression, anxiety and panic disorder. This medicine may be used for other purposes; ask your health care provider or pharmacist if you have questions. COMMON BRAND NAME(S): Effexor XR What should I tell my health care provider before I take this medicine? They need to know if you have any of these conditions:  bleeding disorders  glaucoma  heart disease  high blood pressure  high cholesterol  kidney disease  liver disease  low levels of sodium in the blood  mania or bipolar disorder  seizures  suicidal thoughts, plans, or attempt; a previous suicide attempt  by you or a family  take medicines that treat or prevent blood clots  thyroid disease  an unusual or allergic reaction to venlafaxine, desvenlafaxine, other medicines, foods, dyes, or preservatives  pregnant or trying to get pregnant  breast-feeding How should I use this medicine? Take this medicine by mouth with a full glass of water. Follow the directions on the prescription label. Do not cut, crush, or chew this medicine. Take it with food. If needed, the capsule may be carefully opened and the entire contents sprinkled on a spoonful of cool applesauce. Swallow the applesauce/pellet mixture right away without chewing and follow with a glass of water to ensure complete swallowing of the pellets. Try to take your medicine at about the same time each day. Do not take your medicine more often than directed. Do not stop taking this medicine suddenly except upon the advice of your doctor. Stopping this medicine too quickly may cause serious side effects or your condition may worsen. A special MedGuide will be given to you by the pharmacist with each  prescription and refill. Be sure to read this information carefully each time. Talk to your pediatrician regarding the use of this medicine in children. Special care may be needed. Overdosage: If you think you have taken too much of this medicine contact a poison control center or emergency room at once. NOTE: This medicine is only for you. Do not share this medicine with others. What if I miss a dose? If you miss a dose, take it as soon as you can. If it is almost time for your next dose, take only that dose. Do not take double or extra doses. What may interact with this medicine? Do not take this medicine with any of the following medications:  certain medicines for fungal infections like fluconazole, itraconazole, ketoconazole, posaconazole, voriconazole  cisapride  desvenlafaxine  dronedarone  duloxetine  levomilnacipran  linezolid   MAOIs like Carbex, Eldepryl, Marplan, Nardil, and Parnate  methylene blue (injected into a vein)  milnacipran  pimozide  thioridazine This medicine may also interact with the following medications:  amphetamines  aspirin and aspirin-like medicines  certain medicines for depression, anxiety, or psychotic disturbances  certain medicines for migraine headaches like almotriptan, eletriptan, frovatriptan, naratriptan, rizatriptan, sumatriptan, zolmitriptan  certain medicines for sleep  certain medicines that treat or prevent blood clots like dalteparin, enoxaparin, warfarin  cimetidine  clozapine  diuretics  fentanyl  furazolidone  indinavir  isoniazid  lithium  metoprolol  NSAIDS, medicines for pain and inflammation, like ibuprofen or naproxen  other medicines that prolong the QT interval (cause an abnormal heart rhythm) like dofetilide, ziprasidone  procarbazine  rasagiline  supplements like St. John's wort, kava kava, valerian  tramadol  tryptophan This list may not describe all possible interactions. Give your health care provider a list of all the medicines, herbs, non-prescription drugs, or dietary supplements you use. Also tell them if you smoke, drink alcohol, or use illegal drugs. Some items may interact with your medicine. What should I watch for while using this medicine? Tell your doctor if your symptoms do not get better or if they get worse. Visit your doctor or health care professional for regular checks on your progress. Because it may take several weeks to see the full effects of this medicine, it is important to continue your treatment as prescribed by your doctor. Patients and their families should watch out for new or worsening thoughts of suicide or depression. Also watch out for sudden changes in feelings such as feeling anxious, agitated, panicky, irritable, hostile, aggressive, impulsive, severely restless, overly excited and hyperactive, or  not being able to sleep. If this happens, especially at the beginning of treatment or after a change in dose, call your health care professional. This medicine can cause an increase in blood pressure. Check with your doctor for instructions on monitoring your blood pressure while taking this medicine. You may get drowsy or dizzy. Do not drive, use machinery, or do anything that needs mental alertness until you know how this medicine affects you. Do not stand or sit up quickly, especially if you are an older patient. This reduces the risk of dizzy or fainting spells. Alcohol may interfere with the effect of this medicine. Avoid alcoholic drinks. Your mouth may get dry. Chewing sugarless gum, sucking hard candy and drinking plenty of water will help. Contact your doctor if the problem does not go away or is severe. What side effects may I notice from receiving this medicine? Side effects that you should report to your doctor or health care  professional as soon as possible:  allergic reactions like skin rash, itching or hives, swelling of the face, lips, or tongue  anxious  breathing problems  confusion  changes in vision  chest pain  confusion  elevated mood, decreased need for sleep, racing thoughts, impulsive behavior  eye pain  fast, irregular heartbeat  feeling faint or lightheaded, falls  feeling agitated, angry, or irritable  hallucination, loss of contact with reality  high blood pressure  loss of balance or coordination  palpitations  redness, blistering, peeling or loosening of the skin, including inside the mouth  restlessness, pacing, inability to keep still  seizures  stiff muscles  suicidal thoughts or other mood changes  trouble passing urine or change in the amount of urine  trouble sleeping  unusual bleeding or bruising  unusually weak or tired  vomiting Side effects that usually do not require medical attention (report to your doctor or health  care professional if they continue or are bothersome):  change in sex drive or performance  change in appetite or weight  constipation  dizziness  dry mouth  headache  increased sweating  nausea  tired This list may not describe all possible side effects. Call your doctor for medical advice about side effects. You may report side effects to FDA at 1-800-FDA-1088. Where should I keep my medicine? Keep out of the reach of children. Store at a controlled temperature between 20 and 25 degrees C (68 degrees and 77 degrees F), in a dry place. Throw away any unused medicine after the expiration date. NOTE: This sheet is a summary. It may not cover all possible information. If you have questions about this medicine, talk to your doctor, pharmacist, or health care provider.  2020 Elsevier/Gold Standard (2018-04-24 12:06:43)

## 2019-09-27 ENCOUNTER — Encounter: Payer: Self-pay | Admitting: Physician Assistant

## 2019-09-27 ENCOUNTER — Telehealth (INDEPENDENT_AMBULATORY_CARE_PROVIDER_SITE_OTHER): Payer: BC Managed Care – PPO | Admitting: Physician Assistant

## 2019-09-27 DIAGNOSIS — N951 Menopausal and female climacteric states: Secondary | ICD-10-CM | POA: Diagnosis not present

## 2019-09-27 DIAGNOSIS — K449 Diaphragmatic hernia without obstruction or gangrene: Secondary | ICD-10-CM | POA: Diagnosis not present

## 2019-09-27 DIAGNOSIS — K219 Gastro-esophageal reflux disease without esophagitis: Secondary | ICD-10-CM

## 2019-09-27 DIAGNOSIS — F4321 Adjustment disorder with depressed mood: Secondary | ICD-10-CM | POA: Diagnosis not present

## 2019-09-27 MED ORDER — VENLAFAXINE HCL ER 37.5 MG PO CP24: 37.5000 mg | ORAL_CAPSULE | Freq: Every day | ORAL | 1 refills | Status: DC

## 2019-09-27 MED ORDER — ESOMEPRAZOLE MAGNESIUM 40 MG PO CPDR: 40.0000 mg | DELAYED_RELEASE_CAPSULE | Freq: Two times a day (BID) | ORAL | 1 refills | Status: DC

## 2019-09-27 MED ORDER — ALPRAZOLAM 0.25 MG PO TABS: 0.2500 mg | ORAL_TABLET | Freq: Two times a day (BID) | ORAL | 1 refills | Status: DC | PRN

## 2019-09-27 NOTE — Progress Notes (Signed)
Virtual telephone visit    Virtual Visit via Telephone Note   This visit type was conducted due to national recommendations for restrictions regarding the COVID-19 Pandemic (e.g. social distancing) in an effort to limit this patient's exposure and mitigate transmission in our community. Due to her co-morbid illnesses, this patient is at least at moderate risk for complications without adequate follow up. This format is felt to be most appropriate for this patient at this time. The patient did not have access to video technology or had technical difficulties with video requiring transitioning to audio format only (telephone). Physical exam was limited to content and character of the telephone converstion.    Patient location: Home Provider location: [facility]   Visit Date: 09/27/2019  Today's healthcare provider: Mar Daring, PA-C   Chief Complaint  Patient presents with  . Follow-up    Grief reaction   Subjective    HPI  Follow up for Grief Reaction  The patient was last seen for this 4-6 weeks ago. Changes made at last visit include changed paroxetine to Venlafaxine. Add Alprazolam as needed  She reports excellent compliance with treatment. She feels that condition is Improved. She is not having side effects.  Patient reports that she is very well.  -----------------------------------------------------------------------------------------  Chronic GERD: Patient was seen by GI on 09/17/2019. Reports that she was prescribed Nexium 40 mg and feels that is helping her and is wondering if this can increase.     Patient Active Problem List   Diagnosis Date Noted  . Ileus ([place]) 07/04/2019  . Biliary dyskinesia 12/07/2016  . Perimenopausal symptoms 09/13/2016  . Allergic rhinitis 03/20/2015  . Cold sore 03/20/2015  . Abnormal liver enzymes 03/20/2015  . 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 Hypertension 03/20/2015  . Diaphragmatic  hernia 03/20/2015  . 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 Anxiety disorder 03/20/2015  . Acid reflux 09/23/2007  . Gastro-esophageal reflux disease without esophagitis 09/23/2007   Past Medical History:  Diagnosis Date  . Acute epigastric pain   . GAD (generalized anxiety disorder)   . GERD (gastroesophageal reflux disease)   . Hypertension   . Non-cardiac chest pain       Medications: Outpatient Medications Prior to Visit  Medication Sig  . ALPRAZolam (XANAX) 0.25 MG tablet Take 1 tablet (0.25 mg total) by mouth 2 (two) times daily as needed for anxiety.  Marland Kitchen esomeprazole (NEXIUM) 40 MG capsule Take by mouth.  . fluticasone (FLONASE) 50 MCG/ACT nasal spray Place 2 sprays into both nostrils daily.  Marland Kitchen lactulose (CHRONULAC) 10 GM/15ML solution Take by mouth.  Marland Kitchen lisinopril (ZESTRIL) 10 MG tablet TAKE 1 TABLET(10 MG) BY MOUTH DAILY  . ondansetron (ZOFRAN) 4 MG tablet Take 1 tablet (4 mg total) by mouth every 8 (eight) hours as needed.  Marland Kitchen SYNTHROID 50 MCG tablet TK 1 T PO QD  . traZODone (DESYREL) 50 MG tablet Take 1-2 tablets (50-100 mg total) by mouth at bedtime as needed for sleep.  Marland Kitchen venlafaxine XR (EFFEXOR XR) 37.5 MG 24 hr capsule Take 1 capsule (37.5 mg total) by mouth daily with breakfast.   No facility-administered medications prior to visit.    Review of Systems  Constitutional: Negative.   Respiratory: Negative.   Cardiovascular: Negative.   Neurological: Negative.   Psychiatric/Behavioral: Negative.     Last CBC Lab Results  Component Value Date   WBC 4.6 06/28/2019   HGB 12.6 06/28/2019   HCT 38.2 06/28/2019   MCV  93.2 06/28/2019   MCH 30.7 06/28/2019   RDW 13.0 06/28/2019   PLT 269 06/28/2019   Last metabolic panel Lab Results  Component Value Date   GLUCOSE 100 (H) 06/28/2019   NA 138 06/28/2019   K 4.7 06/28/2019   CL 98 06/28/2019   CO2 30 06/28/2019   BUN 13 06/28/2019   CREATININE 0.73 06/28/2019   GFRNONAA >60 06/28/2019   GFRAA >60  06/28/2019   CALCIUM 9.5 06/28/2019   PROT 7.1 06/28/2019   ALBUMIN 4.2 06/28/2019   LABGLOB 2.4 06/15/2018   AGRATIO 2.0 06/15/2018   BILITOT 0.5 06/28/2019   ALKPHOS 99 06/28/2019   AST 36 06/28/2019   ALT 44 06/28/2019   ANIONGAP 10 06/28/2019      Objective    There were no vitals taken for this visit. BP Readings from Last 3 Encounters:  08/30/19 (!) 143/88  07/15/19 110/74  07/01/19 111/77   Wt Readings from Last 3 Encounters:  08/30/19 116 lb 3.2 oz (52.7 kg)  07/15/19 121 lb (54.9 kg)  07/01/19 119 lb 6.4 oz (54.2 kg)        Assessment & Plan     1. Grief reaction Improving. Continue venlafaxine and alprazolam as below. Mostly using alprazolam only at bedtime currently, not twice daily.  - venlafaxine XR (EFFEXOR XR) 37.5 MG 24 hr capsule; Take 1 capsule (37.5 mg total) by mouth daily with breakfast.  Dispense: 90 capsule; Refill: 1 - ALPRAZolam (XANAX) 0.25 MG tablet; Take 1 tablet (0.25 mg total) by mouth 2 (two) times daily as needed for anxiety.  Dispense: 30 tablet; Refill: 1  2. Menopausal syndrome (hot flashes) Hot flashes have improved and lessened in severity. - venlafaxine XR (EFFEXOR XR) 37.5 MG 24 hr capsule; Take 1 capsule (37.5 mg total) by mouth daily with breakfast.  Dispense: 90 capsule; Refill: 1  3. Diaphragmatic hernia without obstruction and without gangrene Improving but still having some pain and reflux despite taking Nexium 40mg  daily. Will increase to BID dosing. Call if not improving or worsening.  - esomeprazole (NEXIUM) 40 MG capsule; Take 1 capsule (40 mg total) by mouth 2 (two) times daily before a meal.  Dispense: 180 capsule; Refill: 1  4. Gastro-esophageal reflux disease without esophagitis See above medical treatment plan. - esomeprazole (NEXIUM) 40 MG capsule; Take 1 capsule (40 mg total) by mouth 2 (two) times daily before a meal.  Dispense: 180 capsule; Refill: 1    No follow-ups on file.    I discussed the assessment  and treatment plan with the patient. The patient was provided an opportunity to ask questions and all were answered. The patient agreed with the plan and demonstrated an understanding of the instructions.   The patient was advised to call back or seek an in-person evaluation if the symptoms worsen or if the condition fails to improve as anticipated.  I provided 8 minutes of non-face-to-face time during this encounter.  , PA-C, have reviewed all documentation for this visit. The documentation on 09/27/19 for the exam, diagnosis, procedures, and orders are all accurate and complete.  09/29/19 [facility] [phone] (phone) [phone] (fax)  [facility]

## 2019-11-10 ENCOUNTER — Other Ambulatory Visit: Payer: Self-pay | Admitting: Physician Assistant

## 2019-11-10 DIAGNOSIS — I10 Essential (primary) hypertension: Secondary | ICD-10-CM

## 2019-11-10 NOTE — Telephone Encounter (Signed)
Requested Prescriptions  Pending Prescriptions Disp Refills  . lisinopril (ZESTRIL) 10 MG tablet [Pharmacy Med Name: LISINOPRIL 10MG  TABLETS] 90 tablet 0    Sig: TAKE 1 TABLET(10 MG) BY MOUTH DAILY     Cardiovascular:  ACE Inhibitors Failed - 11/10/2019  3:24 AM      Failed - Last BP in normal range    BP Readings from Last 1 Encounters:  08/30/19 (!) 143/88         Passed - Cr in normal range and within 180 days    Creatinine, Ser  Date Value Ref Range Status  06/28/2019 0.73 0.44 - 1.00 mg/dL Final         Passed - K in normal range and within 180 days    Potassium  Date Value Ref Range Status  06/28/2019 4.7 3.5 - 5.1 mmol/L Final         Passed - Patient is not pregnant      Passed - Valid encounter within last 6 months    Recent Outpatient Visits          1 month ago Grief reaction   [facility] [place], [place], [place]   2 months ago Grief reaction   [place], [place], [place]   3 months ago Chronic idiopathic constipation   [facility] [place], [place], [place]   4 months ago Ileus [facility])   [facility] [place], [place], [place]   5 months ago Nausea   [facility] [place], [place], [place]

## 2019-11-26 IMAGING — CR DG HIP (WITH OR WITHOUT PELVIS) 2-3V RIGHT
1 series · 3 of 3 positions shown · non-contrast
Comparison: None.

CLINICAL DATA: Pain.

EXAM:
DG HIP (WITH OR WITHOUT PELVIS) 2-3V RIGHT

[Series 1: dg hip unilat w or w/o pelvis 2-3 views  · non-contrast · 0.14mm/px · 3 of 3 slices shown]
[im 1/3]
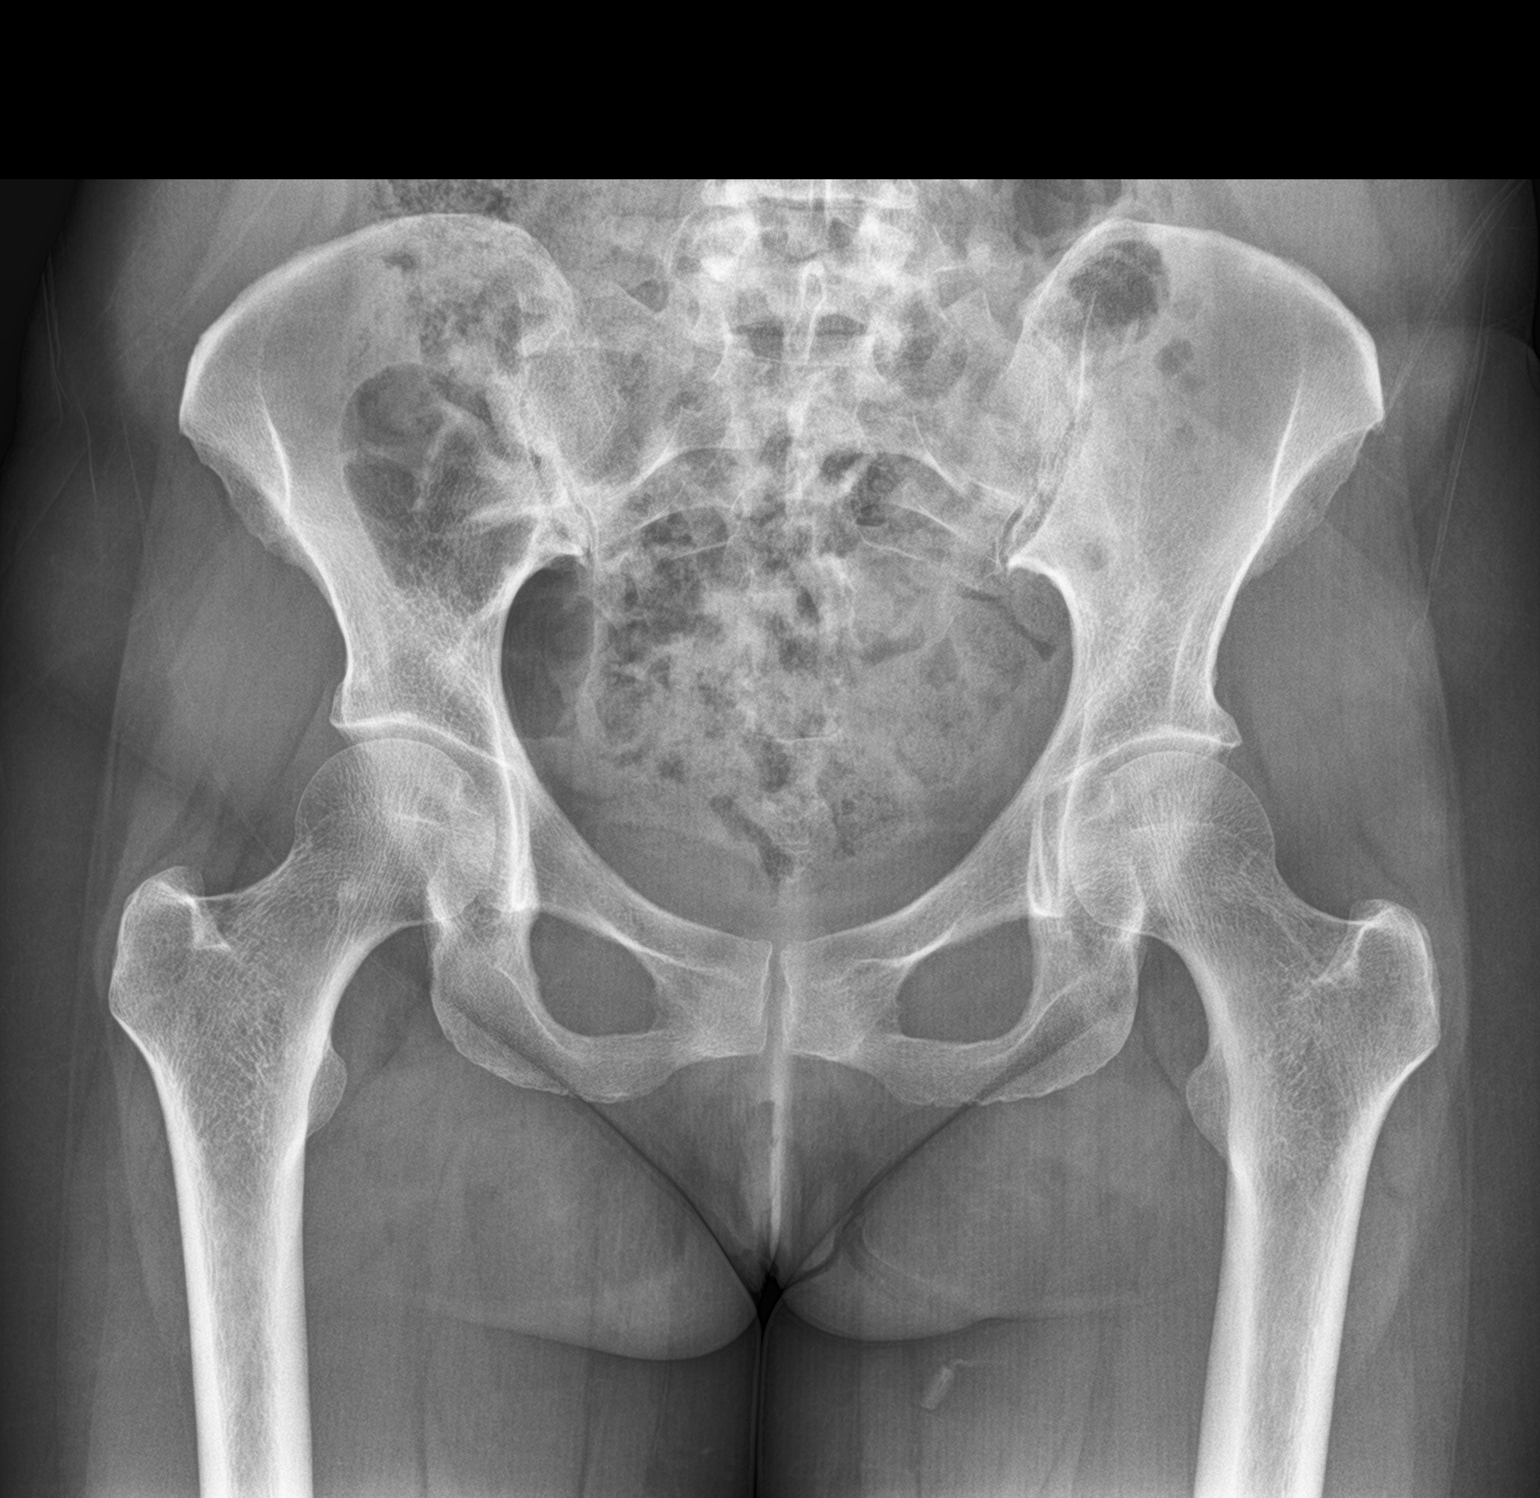
[im 2/3]
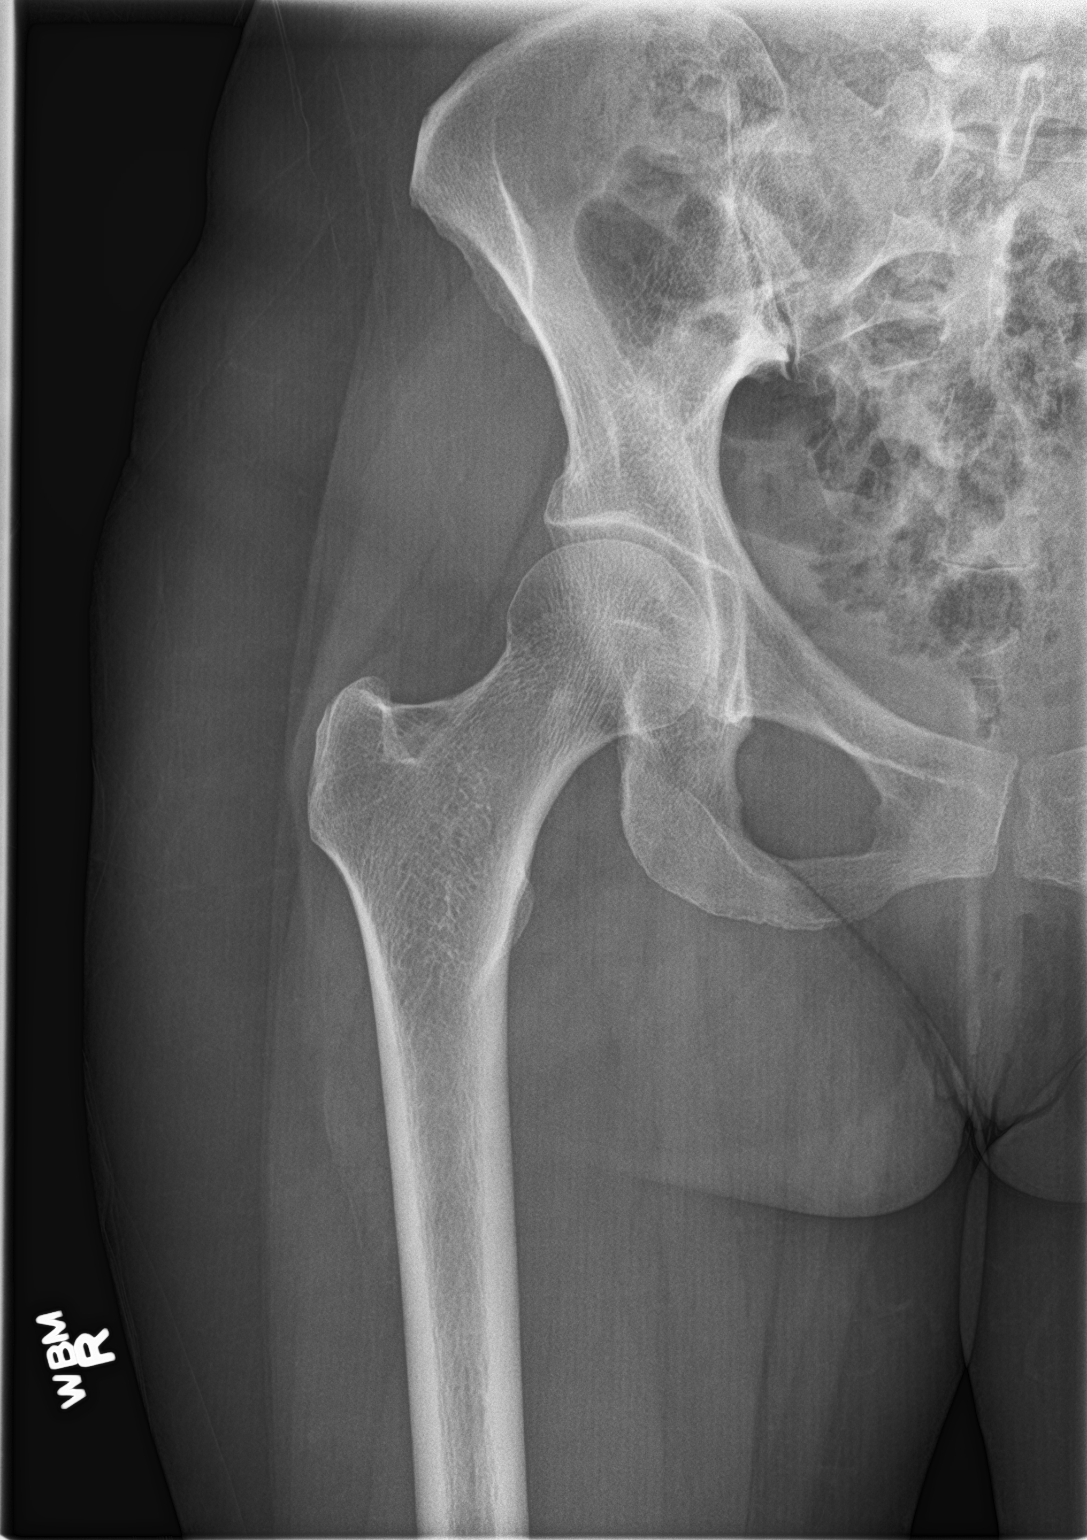
[im 3/3]
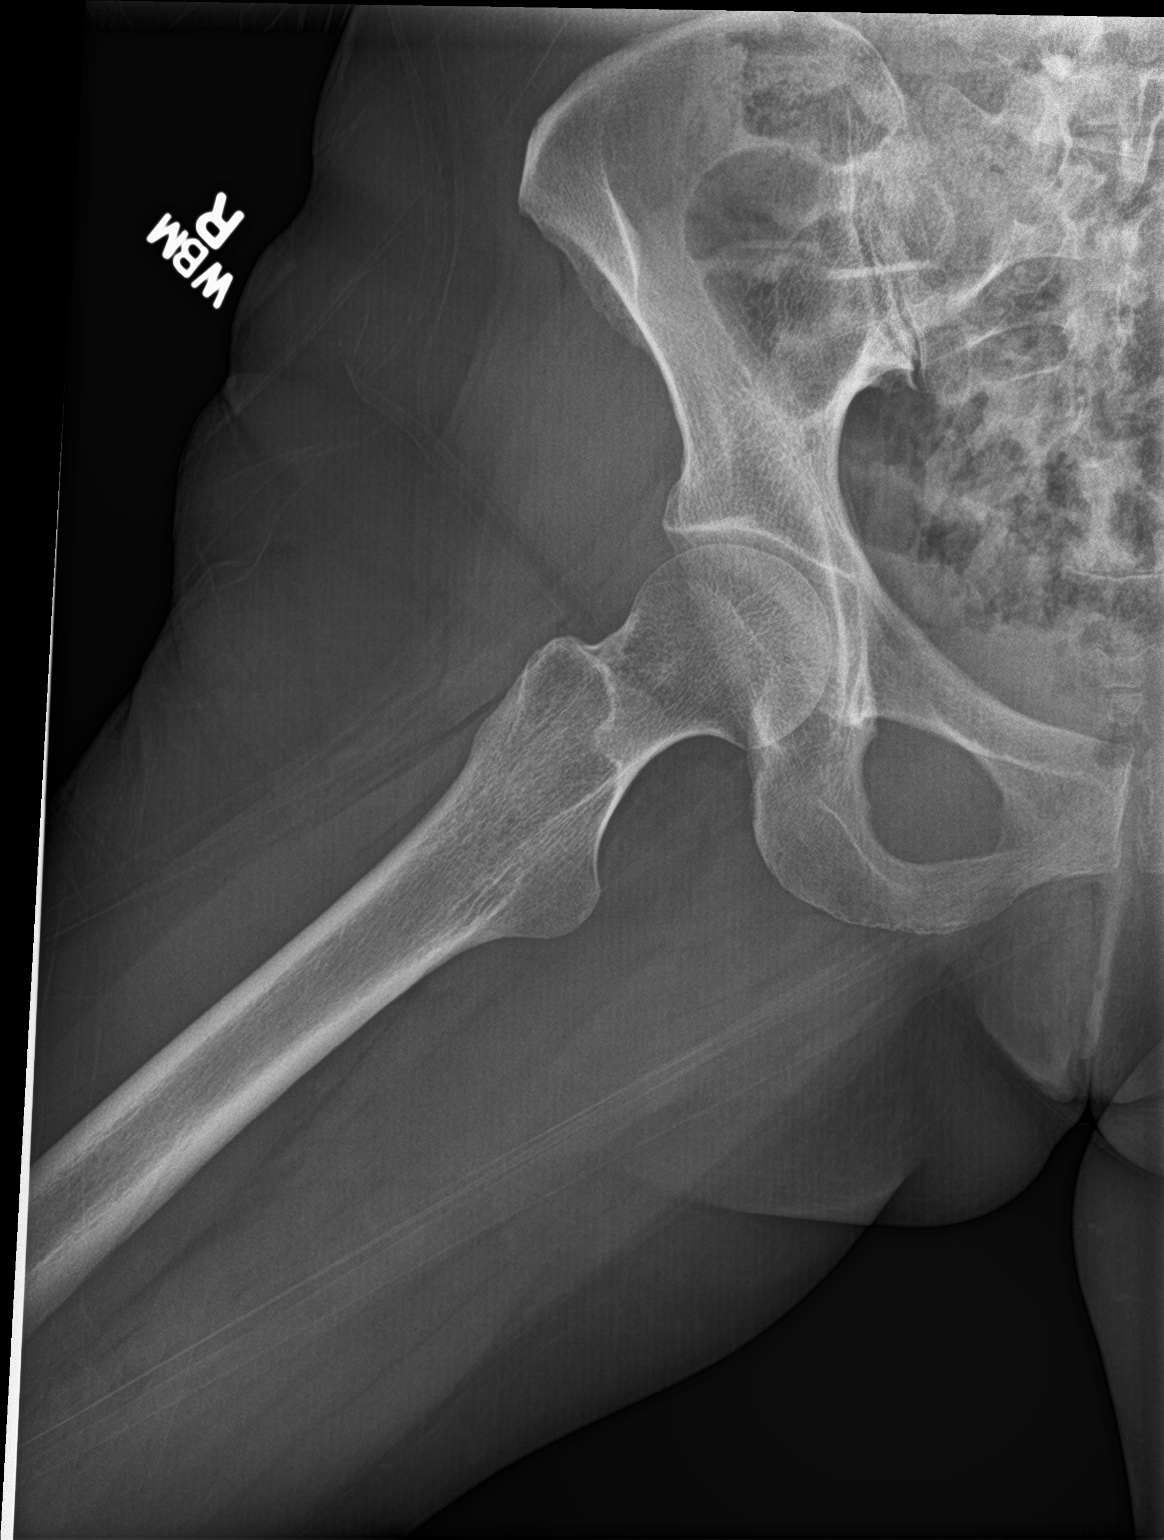

[3 of 3 positions shown; findings below may reference images not displayed]

FINDINGS: There is no evidence of hip fracture or dislocation. There is no
evidence of arthropathy or other focal bone abnormality.
IMPRESSION: Negative.

## 2019-11-26 IMAGING — CR LUMBAR SPINE - COMPLETE 4+ VIEW
1 series · 5 of 5 positions shown · non-contrast
Comparison: October 24, 2016

CLINICAL DATA: Pain.

EXAM:
LUMBAR SPINE - COMPLETE 4+ VIEW

[Series 1: dg lumbar spine complete 4 +v · 0.14mm/px · 5 of 5 slices shown]
[im 1/5]
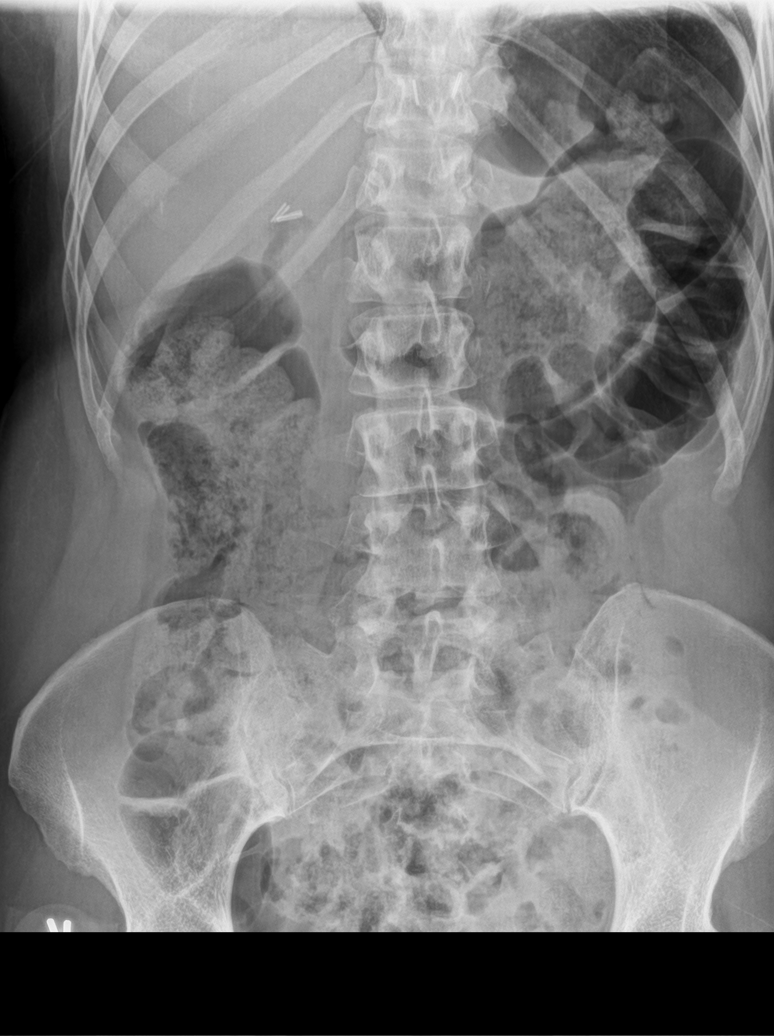
[im 2/5]
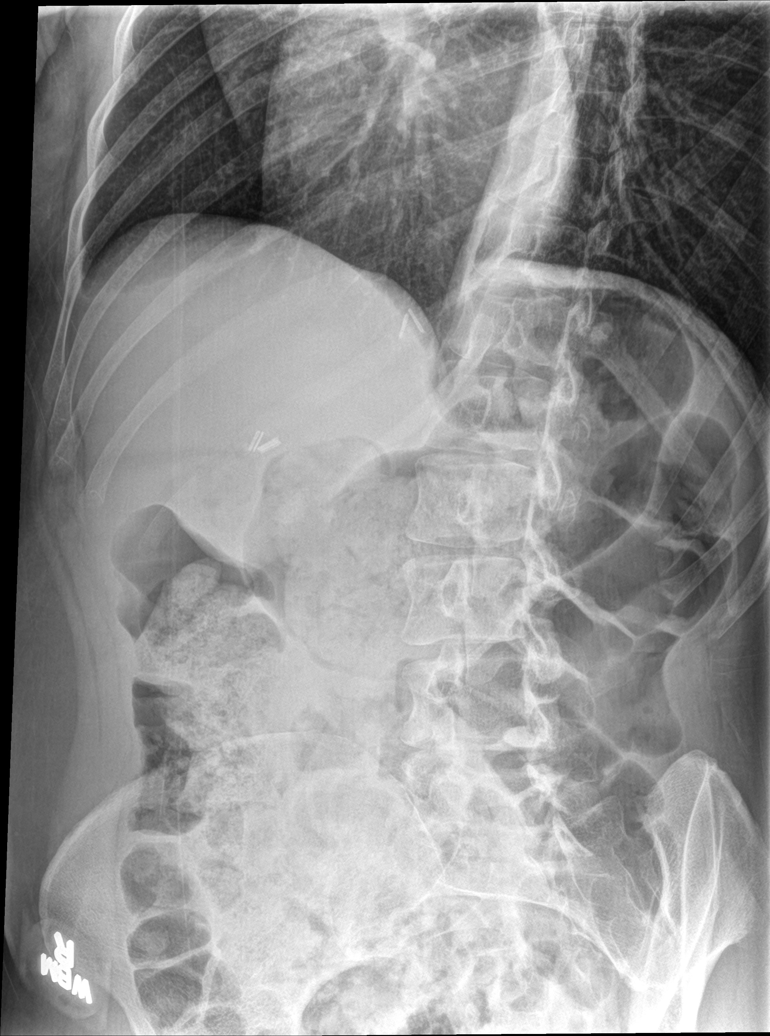
[im 3/5]
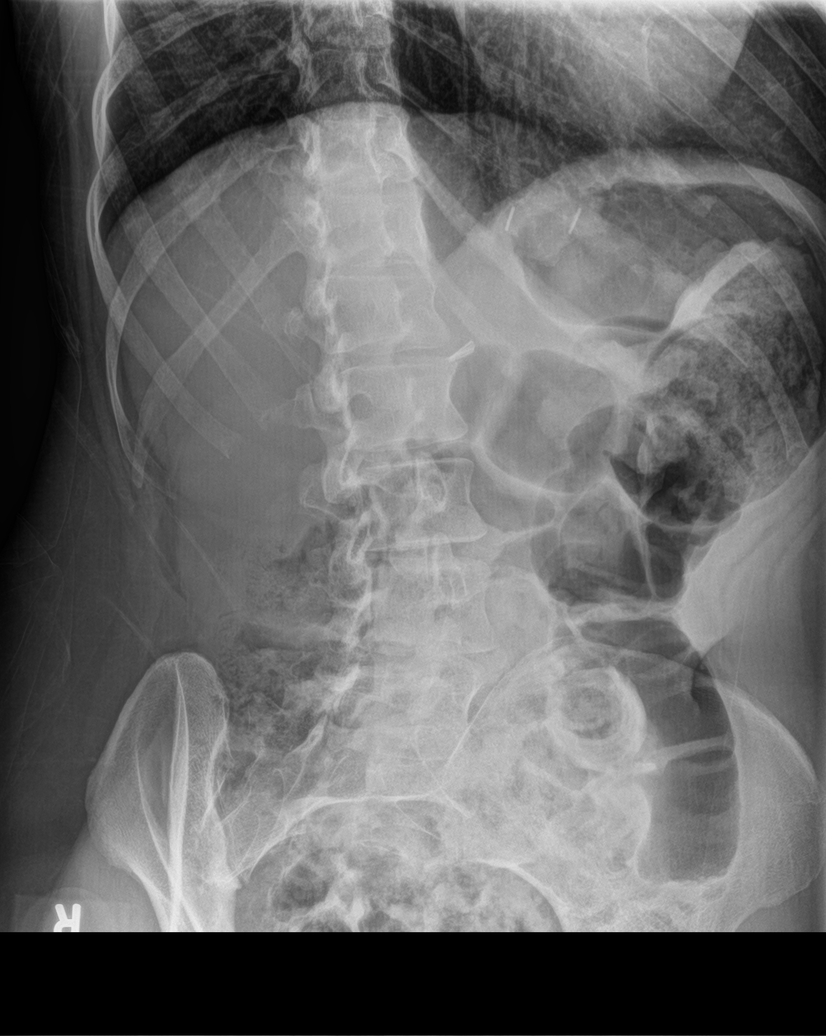
[im 4/5]
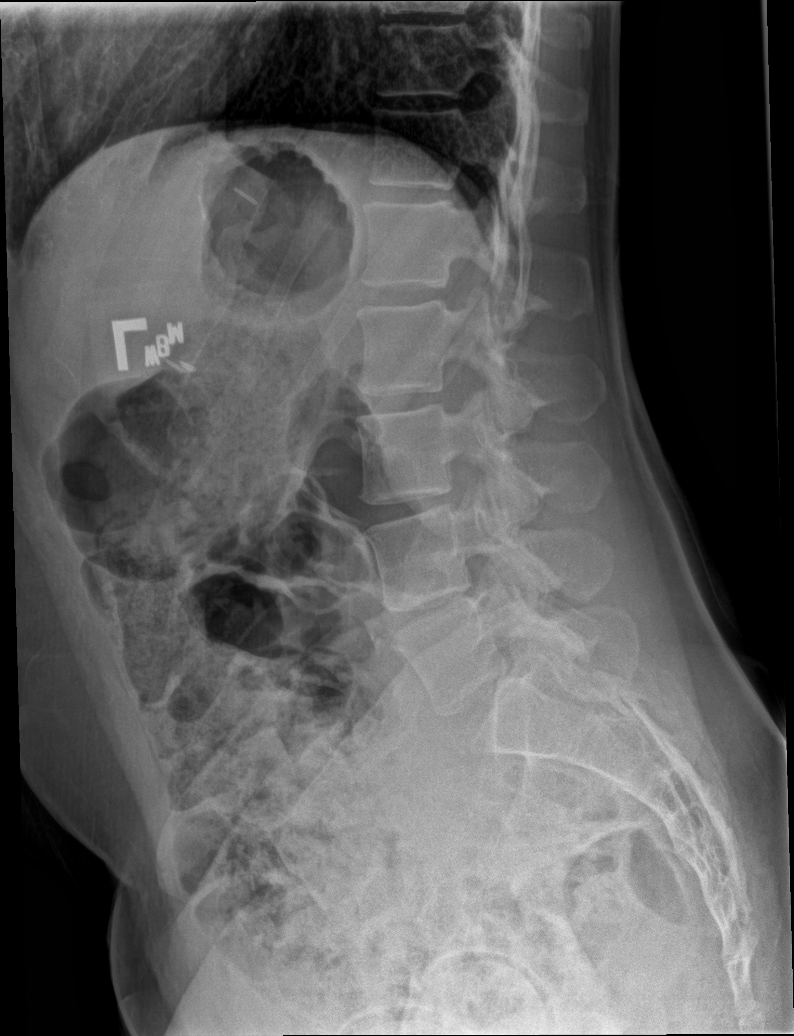
[im 5/5]
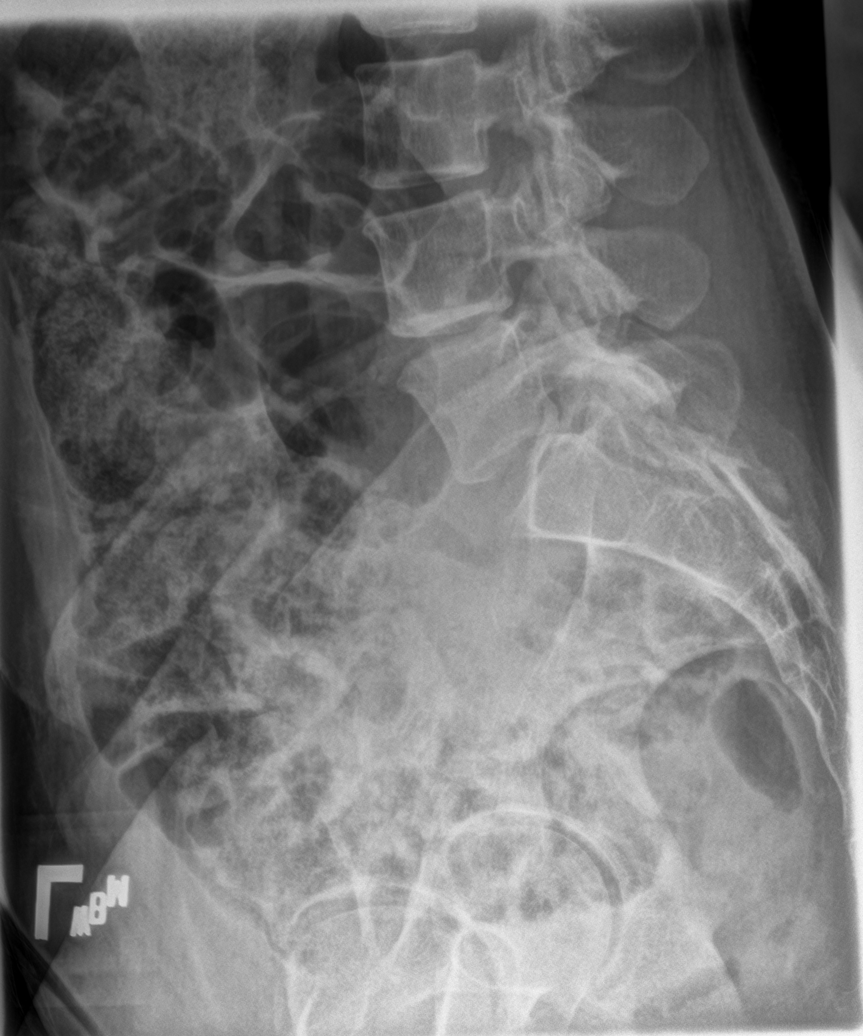

[5 of 5 positions shown; findings below may reference images not displayed]

FINDINGS: Moderate fecal loading throughout the colon.  Cholecystectomy clips.

No fracture or traumatic malalignment. Minimal degenerative changes
with tiny anterior osteophytes at several levels. No other acute
abnormalities.
IMPRESSION: Mild degenerative disc disease.

## 2020-02-11 ENCOUNTER — Other Ambulatory Visit: Payer: Self-pay | Admitting: Physician Assistant

## 2020-02-11 DIAGNOSIS — K449 Diaphragmatic hernia without obstruction or gangrene: Secondary | ICD-10-CM

## 2020-02-11 DIAGNOSIS — I10 Essential (primary) hypertension: Secondary | ICD-10-CM

## 2020-02-11 NOTE — Telephone Encounter (Signed)
Requested medication (s) are due for refill today: Yes  Requested medication (s) are on the active medication list: No  Last refill:  12/11/18  Future visit scheduled: No  Notes to clinic:  Medication discontinued due to "reorder".    Requested Prescriptions  Pending Prescriptions Disp Refills   pantoprazole (PROTONIX) 40 MG tablet [Pharmacy Med Name: PANTOPRAZOLE 40MG  TABLETS] 90 tablet 3    Sig: TAKE 1 TABLET(40 MG) BY MOUTH DAILY      Gastroenterology: Proton Pump Inhibitors Passed - 02/11/2020  7:10 AM      Passed - Valid encounter within last 12 months    Recent Outpatient Visits           4 months ago Grief reaction   [facility] [place], [place], PA-C   5 months ago Grief reaction   [place] [facility] [place], [place], [place]   7 months ago Chronic idiopathic constipation   [facility] [place], [place], [place]   7 months ago Ileus [facility])   [facility] [place], [place] M, M   8 months ago Nausea   [facility] [place], [place] M, M               Signed Prescriptions Disp Refills   lisinopril (ZESTRIL) 10 MG tablet 90 tablet 0    Sig: TAKE 1 TABLET(10 MG) BY MOUTH DAILY      Cardiovascular:  ACE Inhibitors Failed - 02/11/2020  7:10 AM      Failed - Cr in normal range and within 180 days    Creatinine, Ser  Date Value Ref Range Status  06/28/2019 0.73 0.44 - 1.00 mg/dL Final          Failed - K in normal range and within 180 days    Potassium  Date Value Ref Range Status  06/28/2019 4.7 3.5 - 5.1 mmol/L Final          Failed - Last BP in normal range    BP Readings from Last 1 Encounters:  08/30/19 (!) 143/88          Passed - Patient is not pregnant      Passed - Valid encounter within last 6 months    Recent Outpatient Visits           4 months ago Grief reaction   [facility] [place], [place], [place]   5 months ago Grief reaction    [facility] [place], [place], [place]   7 months ago Chronic idiopathic constipation   [facility] [place], [place], [place]   7 months ago Ileus [facility])   [facility] [place], [place], [place]   8 months ago Nausea   [facility] [place], [place], [place]

## 2020-02-26 ENCOUNTER — Telehealth: Payer: Self-pay | Admitting: Physician Assistant

## 2020-02-26 DIAGNOSIS — N951 Menopausal and female climacteric states: Secondary | ICD-10-CM

## 2020-02-26 MED ORDER — PAROXETINE HCL 20 MG PO TABS: 20.0000 mg | ORAL_TABLET | Freq: Every day | ORAL | 1 refills | Status: DC

## 2020-02-26 NOTE — Telephone Encounter (Signed)
So I have sent in Paroxetine to [facility] in [place].  Will need to stop Venlafaxine. Ok to just stop and change to paroxetine.

## 2020-02-26 NOTE — Telephone Encounter (Signed)
Pt called stating that she would like to be put back onto her medication for menopause. She states that her anxiety has gotten worse and is requesting to speak with a nurse. Pt also states that the trazodone she is on is not helping her sleep. Please advise.

## 2020-02-27 ENCOUNTER — Encounter: Payer: Self-pay | Admitting: Physician Assistant

## 2020-02-28 NOTE — Telephone Encounter (Signed)
Patient advised as directed below. 

## 2020-03-09 ENCOUNTER — Telehealth: Payer: Self-pay | Admitting: Physician Assistant

## 2020-03-09 NOTE — Telephone Encounter (Signed)
Patient called to ask the doctor or nurse to call her regarding her medication for PARoxetine (PAXIL) 20 MG tablet.  She stated that she would like to know if she can take it at night because it makes her sleepy, rather than take it in the daytime.  Please advise and call patient to confirm at [phone]

## 2020-03-09 NOTE — Telephone Encounter (Signed)
Fine to switch to nighttime dosing of paxil

## 2020-03-09 NOTE — Telephone Encounter (Signed)
Please review for Jenni.   Thanks,   -Tifanie Gardiner  

## 2020-03-10 NOTE — Telephone Encounter (Signed)
Pt advised.   Thanks,   -Kymoni Monday  

## 2020-04-29 ENCOUNTER — Telehealth: Payer: Self-pay | Admitting: Physician Assistant

## 2020-04-29 DIAGNOSIS — I10 Essential (primary) hypertension: Secondary | ICD-10-CM

## 2020-04-29 MED ORDER — LISINOPRIL 10 MG PO TABS: ORAL_TABLET | ORAL | 0 refills | Status: DC

## 2020-04-29 NOTE — Telephone Encounter (Signed)
[facility] faxed refill request for the following medications:  lisinopril (ZESTRIL) 10 MG tablet - 90 day supply  Please advise. Thanks, [employer]

## 2020-05-11 NOTE — Progress Notes (Signed)
Complete physical exam   Patient: Tami Williams   DOB: 1965/11/04   54 y.o. Female  MRN: [id number] Visit Date: 05/12/2020  Today's healthcare provider: Margaretann Loveless, PA-C   Chief Complaint  Patient presents with  . Annual Exam   Subjective    Tami Williams is a 54 y.o. female who presents today for a complete physical exam.  She reports consuming a general diet. Home exercise routine includes calisthenics. She generally feels fairly well. She reports sleeping well. She does have additional problems to discuss today.  HPI  01/04/2013 Pap-negative 02/04/2019 Mammogram-BI-RADS 1 09/09/2015 IFOBT-Negative 09/17/2019 Colonoscopy done at [facility]  Past Medical History:  Diagnosis Date  . Acute epigastric pain   . GAD (generalized anxiety disorder)   . GERD (gastroesophageal reflux disease)   . Hypertension   . Non-cardiac chest pain    Past Surgical History:  Procedure Laterality Date  . 24 HOUR PH STUDY N/A 01/06/2016   Procedure: 24 HOUR PH STUDY;  Surgeon: Midge Minium, MD;  Location: [facility] ENDOSCOPY;  Service: Endoscopy;  Laterality: N/A;  . ABDOMINAL HYSTERECTOMY     due to endometriosis  . ESOPHAGEAL MANOMETRY N/A 01/06/2016   Procedure: ESOPHAGEAL MANOMETRY (EM);  Surgeon: Midge Minium, MD;  Location: [facility] ENDOSCOPY;  Service: Endoscopy;  Laterality: N/A;  . ESOPHAGOGASTRODUODENOSCOPY    . ESOPHAGOGASTRODUODENOSCOPY (EGD) WITH PROPOFOL N/A 12/02/2015   Procedure: ESOPHAGOGASTRODUODENOSCOPY (EGD) WITH PROPOFOL;  Surgeon: Scot Jun, MD;  Location: [facility] ENDOSCOPY;  Service: Endoscopy;  Laterality: N/A;  . FOOT SURGERY Left 04/2018  . NISSEN FUNDOPLICATION  2007   dr. Katrinka Blazing   Social History   Socioeconomic History  . Marital status: Married    Spouse name: Dwayna Kentner  . Number of children: 3  . Years of education: GED  . Highest education level: Not on file  Occupational History    Employer: [employer]  Tobacco Use  . Smoking status: Never Smoker  .  Smokeless tobacco: Never Used  Vaping Use  . Vaping Use: Never used  Substance and Sexual Activity  . Alcohol use: Yes    Alcohol/week: 0.0 standard drinks    Comment: occasional  . Drug use: No  . Sexual activity: Yes  Other Topics Concern  . Not on file  Social History Narrative  . Not on file   Social Determinants of Health   Financial Resource Strain: Not on file  Food Insecurity: Not on file  Transportation Needs: Not on file  Physical Activity: Not on file  Stress: Not on file  Social Connections: Not on file  Intimate Partner Violence: Not on file   Family Status  Relation Name Status  . Mother  Alive  . Father  Deceased  . Brother half brother Alive  . Brother half brother Alive  . Neg Hx  (Not Specified)   Family History  Problem Relation Age of Onset  . Hypertension Mother   . Heart failure Mother        Defib & pacemaker implant   . Heart disease Mother   . Diabetes Mother   . Hypertension Father   . Healthy Brother   . Healthy Brother   . Breast cancer Neg Hx    Allergies  Allergen Reactions  . No Known Allergies     Patient Care Team: Reine Just as PCP - General (Family Medicine)   Medications: Outpatient Medications Prior to Visit  Medication Sig  . ALPRAZolam (XANAX) 0.25 MG tablet  Take 1 tablet (0.25 mg total) by mouth 2 (two) times daily as needed for anxiety.  . fluticasone (FLONASE) 50 MCG/ACT nasal spray Place 2 sprays into both nostrils daily.  Marland Kitchen lactulose (CHRONULAC) 10 GM/15ML solution Take by mouth.  Marland Kitchen lisinopril (ZESTRIL) 10 MG tablet TAKE 1 TABLET(10 MG) BY MOUTH DAILY  . ondansetron (ZOFRAN) 4 MG tablet Take 1 tablet (4 mg total) by mouth every 8 (eight) hours as needed.  . pantoprazole (PROTONIX) 40 MG tablet TAKE 1 TABLET(40 MG) BY MOUTH DAILY  . PARoxetine (PAXIL) 20 MG tablet Take 1 tablet (20 mg total) by mouth daily.  Marland Kitchen SYNTHROID 50 MCG tablet TK 1 T PO QD  . traZODone (DESYREL) 50 MG tablet Take 1-2  tablets (50-100 mg total) by mouth at bedtime as needed for sleep.  Marland Kitchen esomeprazole (NEXIUM) 40 MG capsule Take 1 capsule (40 mg total) by mouth 2 (two) times daily before a meal.  . [DISCONTINUED] venlafaxine XR (EFFEXOR XR) 37.5 MG 24 hr capsule Take 1 capsule (37.5 mg total) by mouth daily with breakfast. (Patient not taking: Reported on 05/12/2020)   No facility-administered medications prior to visit.    Review of Systems  Constitutional: Positive for diaphoresis.  HENT: Negative.   Eyes: Negative.   Respiratory: Negative.   Cardiovascular: Negative.   Gastrointestinal: Positive for abdominal distention and abdominal pain.  Endocrine: Negative.   Genitourinary: Positive for frequency.  Musculoskeletal: Positive for back pain.  Skin: Negative.   Allergic/Immunologic: Negative.   Neurological: Negative.   Hematological: Negative.   Psychiatric/Behavioral: Positive for sleep disturbance. The patient is nervous/anxious.     Last CBC Lab Results  Component Value Date   WBC 4.6 06/28/2019   HGB 12.6 06/28/2019   HCT 38.2 06/28/2019   MCV 93.2 06/28/2019   MCH 30.7 06/28/2019   RDW 13.0 06/28/2019   PLT 269 06/28/2019   Last metabolic panel Lab Results  Component Value Date   GLUCOSE 100 (H) 06/28/2019   NA 138 06/28/2019   K 4.7 06/28/2019   CL 98 06/28/2019   CO2 30 06/28/2019   BUN 13 06/28/2019   CREATININE 0.73 06/28/2019   GFRNONAA >60 06/28/2019   GFRAA >60 06/28/2019   CALCIUM 9.5 06/28/2019   PROT 7.1 06/28/2019   ALBUMIN 4.2 06/28/2019   LABGLOB 2.4 06/15/2018   AGRATIO 2.0 06/15/2018   BILITOT 0.5 06/28/2019   ALKPHOS 99 06/28/2019   AST 36 06/28/2019   ALT 44 06/28/2019   ANIONGAP 10 06/28/2019   Last lipids Lab Results  Component Value Date   CHOL 207 (H) 06/15/2018   HDL 86 06/15/2018   LDLCALC 106 (H) 06/15/2018   TRIG 77 06/15/2018   CHOLHDL 2.4 06/15/2018   Last hemoglobin A1c Lab Results  Component Value Date   HGBA1C 5.4 06/15/2018    Last thyroid functions Lab Results  Component Value Date   TSH 7.200 (H) 03/20/2015   Last vitamin D No results found for: 25OHVITD2, 25OHVITD3, VD25OH Last vitamin B12 and Folate No results found for: VITAMINB12, FOLATE    Objective    BP 116/80 (BP Location: Left Arm, Patient Position: Sitting, Cuff Size: Normal)   Pulse 70   Temp 98.4 F (36.9 C) (Oral)   Resp 16   Ht 5\' 4"  (1.626 m)   Wt 121 lb 12.8 oz (55.2 kg)   SpO2 97%   BMI 20.91 kg/m  BP Readings from Last 3 Encounters:  05/12/20 116/80  08/30/19 (!) 143/88  07/15/19 110/74  Wt Readings from Last 3 Encounters:  05/12/20 121 lb 12.8 oz (55.2 kg)  08/30/19 116 lb 3.2 oz (52.7 kg)  07/15/19 121 lb (54.9 kg)      Physical Exam Vitals reviewed.  Constitutional:      General: She is not in acute distress.    Appearance: Normal appearance. She is well-developed, normal weight and well-nourished. She is not ill-appearing or diaphoretic.  HENT:     Head: Normocephalic and atraumatic.     Right Ear: Hearing, tympanic membrane, ear canal and external ear normal.     Left Ear: Hearing, tympanic membrane, ear canal and external ear normal.     Nose: Nose normal.     Mouth/Throat:     Mouth: Oropharynx is clear and moist and mucous membranes are normal. Mucous membranes are moist.     Pharynx: Oropharynx is clear. Uvula midline. No oropharyngeal exudate.  Eyes:     General: No scleral icterus.       Right eye: No discharge.        Left eye: No discharge.     Extraocular Movements: Extraocular movements intact and EOM normal.     Conjunctiva/sclera: Conjunctivae normal.     Pupils: Pupils are equal, round, and reactive to light.  Neck:     Thyroid: No thyromegaly.     Vascular: No carotid bruit or JVD.     Trachea: No tracheal deviation.  Cardiovascular:     Rate and Rhythm: Normal rate and regular rhythm.     Pulses: Normal pulses and intact distal pulses.     Heart sounds: Normal heart sounds. No murmur  heard. No friction rub. No gallop.   Pulmonary:     Effort: Pulmonary effort is normal. No respiratory distress.     Breath sounds: Normal breath sounds. No wheezing or rales.  Chest:     Chest wall: No tenderness.  Breasts: No discharge from either breast. No tenderness and bleeding. Breasts are symmetrical.     Right: No inverted nipple, mass, nipple discharge, skin change or tenderness.     Left: No inverted nipple, mass, nipple discharge, skin change or tenderness.    Abdominal:     General: Abdomen is flat. Bowel sounds are normal. There is no distension.     Palpations: Abdomen is soft. There is no mass.     Tenderness: There is no abdominal tenderness. There is no guarding or rebound.     Hernia: There is no hernia in the right inguinal area, left inguinal area or right inguinal area.  Genitourinary:    General: Normal vulva.     Exam position: Supine.     Pubic Area: No rash.      Labia:        Right: No rash, tenderness, lesion or injury.        Left: No rash, tenderness, lesion or injury.      Vagina: Normal. No signs of injury. No vaginal discharge, erythema, tenderness or bleeding.     Uterus: Absent, normal.      Adnexa:        Right: No mass, tenderness or fullness.         Left: No mass, tenderness or fullness.       Rectum: Normal.  Musculoskeletal:        General: No tenderness or edema. Normal range of motion.     Cervical back: Normal range of motion and neck supple. No tenderness.  Lymphadenopathy:  Cervical: No cervical adenopathy.     Lower Body: No right inguinal and no right inguinal adenopathy. No left inguinal and no left inguinal adenopathy.  Skin:    General: Skin is warm and dry.     Findings: No rash.  Neurological:     Mental Status: She is alert and oriented to person, place, and time.     Cranial Nerves: No cranial nerve deficit.     Coordination: Coordination normal.     Deep Tendon Reflexes: Reflexes are normal and symmetric.   Psychiatric:        Mood and Affect: Mood and affect normal.        Behavior: Behavior normal.        Thought Content: Thought content normal.        Judgment: Judgment normal.       Last depression screening scores PHQ 2/9 Scores 08/30/2019 10/15/2018 06/15/2018  PHQ - 2 Score 0 0 0  PHQ- 9 Score - 4 3   Last fall risk screening Fall Risk  02/22/2017  Falls in the past year? No   Last Audit-C alcohol use screening Alcohol Use Disorder Test (AUDIT) 06/15/2018  1. How often do you have a drink containing alcohol? 2  2. How many drinks containing alcohol do you have on a typical day when you are drinking? 1  3. How often do you have six or more drinks on one occasion? 0  AUDIT-C Score 3   A score of 3 or more in women, and 4 or more in men indicates increased risk for alcohol abuse, EXCEPT if all of the points are from question 1   No results found for any visits on 05/12/20.  Assessment & Plan    Routine Health Maintenance and Physical Exam  Exercise Activities and Dietary recommendations Goals   None     Immunization History  Administered Date(s) Administered  . Influenza Inj Mdck Quad Pf 02/24/2019  . Influenza,inj,Quad PF,6+ Mos 02/27/2016, 02/22/2017, 02/22/2018  . Tdap 02/22/2017    Health Maintenance  Topic Date Due  . Hepatitis C Screening  Never done  . COVID-19 Vaccine (1) Never done  . HIV Screening  Never done  . COLONOSCOPY (Pts 45-46yrs Insurance coverage will need to be confirmed)  Never done  . PAP SMEAR-Modifier  01/05/2016  . INFLUENZA VACCINE  12/15/2019  . MAMMOGRAM  02/03/2021  . TETANUS/TDAP  02/23/2027    Discussed health benefits of physical activity, and encouraged her to engage in regular exercise appropriate for her age and condition.  1. Annual physical exam Normal physical exam today. Will check labs as below and f/u pending lab results. If labs are stable and WNL she will not need to have these rechecked for one year at her next  annual physical exam. She is to call the office in the meantime if she has any acute issue, questions or concerns. - Comprehensive metabolic panel - Lipid panel - TSH - MM 3D SCREEN BREAST BILATERAL; Future - Hepatitis C antibody - HIV Antibody (routine testing w rflx) - CBC w/Diff/Platelet  2. Screening for vaginal cancer Pap collected today. Will send as below and f/u pending results. - Cytology - PAP  3. Encounter for screening mammogram for malignant neoplasm of breast Breast exam today was normal. There is no family history of breast cancer. She does perform regular self breast exams. Mammogram was ordered as below. Information for [facility] clinic was given to patient so she may schedule her mammogram  at her convenience. - MM 3D SCREEN BREAST BILATERAL; Future  4. [facility]. Will abstract.   5. Essential hypertension Stable. Diagnosis pulled for medication refill. Continue current medical treatment plan. Will check labs as below and f/u pending results. - Comprehensive metabolic panel - CBC w/Diff/Platelet - lisinopril (ZESTRIL) 10 MG tablet; TAKE 1 TABLET(10 MG) BY MOUTH DAILY  Dispense: 90 tablet; Refill: 3  6. Difficulty sleeping Uses Trazodone prn.   7. Encounter for hepatitis C screening test for low risk patient Will check labs as below and f/u pending results. - Hepatitis C antibody  8. Screening for HIV (human immunodeficiency virus) Will check labs as below and f/u pending results. - HIV Antibody (routine testing w rflx)  9. Need for influenza vaccination Flu vaccine given today without complication. Patient sat upright for 15 minutes to check for adverse reaction before being released. - Flu Vaccine QUAD 36+ mos IM  10. Vaginal atrophy New issue over last few months. Will try estrace as below. Start with daily use x 2 weeks, then decrease to M-W-F, and if possible can decrease to once weekly if possible. Continue self breast  exams and annual mammograms. S/P hysterectomy. - estradiol (ESTRACE VAGINAL) 0.1 MG/GM vaginal cream; Place 1 Applicatorful vaginally at bedtime.  Dispense: 42.5 g; Refill: 12   No follow-ups on file.     Delmer IslamI, Laine Fonner M Tayjah Lobdell, PA-C, have reviewed all documentation for this visit. The documentation on 05/12/20 for the exam, diagnosis, procedures, and orders are all accurate and complete.   Reine JustJennifer M Cassie Henkels, PA-C  [facility] [phone] (phone) [phone] (fax)  [facility]

## 2020-05-12 ENCOUNTER — Other Ambulatory Visit (HOSPITAL_COMMUNITY)
Admission: RE | Admit: 2020-05-12 | Discharge: 2020-05-12 | Disposition: A | Discharge status: Home / Self Care | Payer: BC Managed Care – PPO | Source: Ambulatory Visit | Attending: Physician Assistant | Admitting: Physician Assistant

## 2020-05-12 ENCOUNTER — Other Ambulatory Visit: Payer: Self-pay

## 2020-05-12 ENCOUNTER — Encounter: Payer: Self-pay | Admitting: Physician Assistant

## 2020-05-12 ENCOUNTER — Ambulatory Visit (INDEPENDENT_AMBULATORY_CARE_PROVIDER_SITE_OTHER): Payer: BC Managed Care – PPO | Admitting: Physician Assistant

## 2020-05-12 VITALS — BP 116/80 | HR 70 | Temp 98.4°F | Resp 16 | Ht 64.0 in | Wt 121.8 lb

## 2020-05-12 DIAGNOSIS — Z1272 Encounter for screening for malignant neoplasm of vagina: Secondary | ICD-10-CM

## 2020-05-12 DIAGNOSIS — Z1231 Encounter for screening mammogram for malignant neoplasm of breast: Secondary | ICD-10-CM

## 2020-05-12 DIAGNOSIS — Z0000 Encounter for general adult medical examination without abnormal findings: Secondary | ICD-10-CM

## 2020-05-12 DIAGNOSIS — Z23 Encounter for immunization: Secondary | ICD-10-CM | POA: Diagnosis not present

## 2020-05-12 DIAGNOSIS — Z1211 Encounter for screening for malignant neoplasm of colon: Secondary | ICD-10-CM | POA: Diagnosis not present

## 2020-05-12 DIAGNOSIS — Z114 Encounter for screening for human immunodeficiency virus [HIV]: Secondary | ICD-10-CM

## 2020-05-12 DIAGNOSIS — G479 Sleep disorder, unspecified: Secondary | ICD-10-CM

## 2020-05-12 DIAGNOSIS — N952 Postmenopausal atrophic vaginitis: Secondary | ICD-10-CM | POA: Diagnosis not present

## 2020-05-12 DIAGNOSIS — Z1159 Encounter for screening for other viral diseases: Secondary | ICD-10-CM

## 2020-05-12 DIAGNOSIS — I10 Essential (primary) hypertension: Secondary | ICD-10-CM

## 2020-05-12 MED ORDER — ESTRADIOL 0.1 MG/GM VA CREA: 1.0000 | TOPICAL_CREAM | Freq: Every day | VAGINAL | 12 refills | Status: DC

## 2020-05-12 MED ORDER — LISINOPRIL 10 MG PO TABS: ORAL_TABLET | ORAL | 3 refills | Status: DC

## 2020-05-12 NOTE — Patient Instructions (Signed)
Preventive Care 52-52 Years Old, Female °Preventive care refers to visits with your health care provider and lifestyle choices that can promote health and wellness. This includes: °· A yearly physical exam. This may also be called an annual well check. °· Regular dental visits and eye exams. °· Immunizations. °· Screening for certain conditions. °· Healthy lifestyle choices, such as eating a healthy diet, getting regular exercise, not using drugs or products that contain nicotine and tobacco, and limiting alcohol use. °What can I expect for my preventive care visit? °Physical exam °Your health care provider will check your: °· Height and weight. This may be used to calculate body mass index (BMI), which tells if you are at a healthy weight. °· Heart rate and blood pressure. °· Skin for abnormal spots. °Counseling °Your health care provider may ask you questions about your: °· Alcohol, tobacco, and drug use. °· Emotional well-being. °· Home and relationship well-being. °· Sexual activity. °· Eating habits. °· Work and work environment. °· Method of birth control. °· Menstrual cycle. °· Pregnancy history. °What immunizations do I need? ° °Influenza (flu) vaccine °· This is recommended every year. °Tetanus, diphtheria, and pertussis (Tdap) vaccine °· You may need a Td booster every 10 years. °Varicella (chickenpox) vaccine °· You may need this if you have not been vaccinated. °Zoster (shingles) vaccine °· You may need this after age 52. °Measles, mumps, and rubella (MMR) vaccine °· You may need at least one dose of MMR if you were born in 1957 or later. You may also need a second dose. °Pneumococcal conjugate (PCV13) vaccine °· You may need this if you have certain conditions and were not previously vaccinated. °Pneumococcal polysaccharide (PPSV23) vaccine °· You may need one or two doses if you smoke cigarettes or if you have certain conditions. °Meningococcal conjugate (MenACWY) vaccine °· You may need this if you  have certain conditions. °Hepatitis A vaccine °· You may need this if you have certain conditions or if you travel or work in places where you may be exposed to hepatitis A. °Hepatitis B vaccine °· You may need this if you have certain conditions or if you travel or work in places where you may be exposed to hepatitis B. °Haemophilus influenzae type b (Hib) vaccine °· You may need this if you have certain conditions. °Human papillomavirus (HPV) vaccine °· If recommended by your health care provider, you may need three doses over 6 months. °You may receive vaccines as individual doses or as more than one vaccine together in one shot (combination vaccines). Talk with your health care provider about the risks and benefits of combination vaccines. °What tests do I need? °Blood tests °· Lipid and cholesterol levels. These may be checked every 5 years, or more frequently if you are over 52 years old. °· Hepatitis C test. °· Hepatitis B test. °Screening °· Lung cancer screening. You may have this screening every year starting at age 52 if you have a 30-pack-year history of smoking and currently smoke or have quit within the past 15 years. °· Colorectal cancer screening. All adults should have this screening starting at age 52 and continuing until age 75. Your health care provider may recommend screening at age 45 if you are at increased risk. You will have tests every 1-10 years, depending on your results and the type of screening test. °· Diabetes screening. This is done by checking your blood sugar (glucose) after you have not eaten for a while (fasting). You may have this   done every 1-52 years.  Mammogram. This may be done every 1-52 years. Talk with your health care provider about when you should start having regular mammograms. This may depend on whether you have a family history of breast cancer.  BRCA-related cancer screening. This may be done if you have a family history of breast, ovarian, tubal, or peritoneal  cancers.  Pelvic exam and Pap test. This may be done every 3 years starting at age 52. Starting at age 52, this may be done every 5 years if you have a Pap test in combination with an HPV test. Other tests  Sexually transmitted disease (STD) testing.  Bone density scan. This is done to screen for osteoporosis. You may have this scan if you are at high risk for osteoporosis. Follow these instructions at home: Eating and drinking  Eat a diet that includes fresh fruits and vegetables, whole grains, lean protein, and low-fat dairy.  Take vitamin and mineral supplements as recommended by your health care provider.  Do not drink alcohol if: ? Your health care provider tells you not to drink. ? You are pregnant, may be pregnant, or are planning to become pregnant.  If you drink alcohol: ? Limit how much you have to 0-1 drink a day. ? Be aware of how much alcohol is in your drink. In the [place] one drink equals one 12 oz bottle of beer (355 mL), one 5 oz glass of wine (148 mL), or one 1 oz glass of hard liquor (44 mL). Lifestyle  Take daily care of your teeth and gums.  Stay active. Exercise for at least 30 minutes on 5 or more days each week.  Do not use any products that contain nicotine or tobacco, such as cigarettes, e-cigarettes, and chewing tobacco. If you need help quitting, ask your health care provider.  If you are sexually active, practice safe sex. Use a condom or other form of birth control (contraception) in order to prevent pregnancy and STIs (sexually transmitted infections).  If told by your health care provider, take low-dose aspirin daily starting at age 52. What's next?  Visit your health care provider once a year for a well check visit.  Ask your health care provider how often you should have your eyes and teeth checked.  Stay up to date on all vaccines. This information is not intended to replace advice given to you by your health care provider. Make sure you  discuss any questions you have with your health care provider. Document Revised: 01/11/2018 Document Reviewed: 01/11/2018 Elsevier Patient Education  2020 [employer].

## 2020-05-17 LAB — CYTOLOGY - PAP
Comment: NEGATIVE
Diagnosis: NEGATIVE
High risk HPV: NEGATIVE

## 2020-08-05 ENCOUNTER — Telehealth: Payer: Self-pay

## 2020-08-05 DIAGNOSIS — M5441 Lumbago with sciatica, right side: Secondary | ICD-10-CM

## 2020-08-05 NOTE — Telephone Encounter (Signed)
Copied from CRM [id number]. Topic: General - Inquiry >> Aug 05, 2020  8:04 AM Elliot Gault wrote: Reason for CRM: patient requesting a referral to Dr. Mcarthur Rossetti. Thibaudeau,MD/ Orthopaedic [address] [place], [place] [address] - [phone] - [phone] due to back pain. Patient states PCP is aware of concerns regarding her back.

## 2020-08-05 NOTE — Telephone Encounter (Signed)
Referral placed.

## 2020-09-29 ENCOUNTER — Other Ambulatory Visit: Payer: Self-pay | Admitting: Physician Assistant

## 2020-09-29 DIAGNOSIS — K449 Diaphragmatic hernia without obstruction or gangrene: Secondary | ICD-10-CM

## 2020-09-29 DIAGNOSIS — K219 Gastro-esophageal reflux disease without esophagitis: Secondary | ICD-10-CM

## 2020-10-22 ENCOUNTER — Telehealth: Payer: Self-pay

## 2020-10-22 NOTE — Telephone Encounter (Signed)
[facility] in [place] is requesting refills on Trazodone 50 mg.

## 2020-10-22 NOTE — Telephone Encounter (Signed)
Patient was last seen in office 05/12/2020, follow up visit is needed prior to any further refills. Called patient and advised her that Mrs. Rosezetta Schlatter is no longer a physician at this practice and that we  can establish her with another provider at our office. Patient was upset that she was not notified that Mrs. Burnette was no longer physician at this practice,she states that she did not receive letter/or mychart message letting her know, she states  that she no longer wants to be seen here. Patient states that she will follow up with a new physician at another practice and stated that she did not want me to refill medication. KW

## 2020-11-02 NOTE — Telephone Encounter (Signed)
Noted  

## 2020-11-20 ENCOUNTER — Ambulatory Visit: Payer: Self-pay

## 2020-11-20 NOTE — Telephone Encounter (Signed)
Pt. Currently out of town. Was stung by a wasp yesterday on right upper eye lid. Lid is "almost swollen shut." Has taken Benadryl. Has a headache and eye pain.Redness to eyelid. Will go to UC.

## 2020-11-20 NOTE — Telephone Encounter (Signed)
Answer Assessment - Initial Assessment Questions 1. TYPE: "What type of sting was it?" (bee, yellow jacket, etc.)      Wasp 2. ONSET: "When did it occur?"      Yesterday 3. LOCATION: "Where is the sting located?"  "How many stings?"     Right eyelid 4. SWELLING SIZE: "How big is the swelling?" (e.g., inches or cm)     Top lid 5. REDNESS: "Is the area red or pink?" If Yes, ask: "What size is area of redness?" (e.g., inches or cm). "When did the redness start?"     Yes 6. PAIN: "Is there any pain?" If Yes, ask: "How bad is it?"  (Scale 1-10; or mild, moderate, severe)     6 7. ITCHING: "Is there any itching?" If Yes, ask: "How bad is it?"      No 8. RESPIRATORY DISTRESS: "Describe your breathing."     No 9. PRIOR REACTIONS: "Have you had any severe allergic reactions to stings in the past?" if yes, ask: "What happened?"     No 10. OTHER SYMPTOMS: "Do you have any other symptoms?" (e.g., abdominal pain, face or tongue swelling, new rash elsewhere, vomiting)       No 11. PREGNANCY: "Is there any chance you are pregnant?" "When was your last menstrual period?"       No  Protocols used: Bee or Yellow Jacket Sting-A-AH

## 2020-11-21 ENCOUNTER — Other Ambulatory Visit: Payer: Self-pay

## 2020-11-21 ENCOUNTER — Encounter: Payer: Self-pay | Admitting: Emergency Medicine

## 2020-11-21 ENCOUNTER — Ambulatory Visit
Admission: EM | Admit: 2020-11-21 | Discharge: 2020-11-21 | Disposition: A | Discharge status: Home / Self Care | Payer: BC Managed Care – PPO

## 2020-11-21 DIAGNOSIS — R220 Localized swelling, mass and lump, head: Secondary | ICD-10-CM | POA: Diagnosis not present

## 2020-11-21 DIAGNOSIS — T63461A Toxic effect of venom of wasps, accidental (unintentional), initial encounter: Secondary | ICD-10-CM | POA: Diagnosis not present

## 2020-11-21 DIAGNOSIS — T7840XA Allergy, unspecified, initial encounter: Secondary | ICD-10-CM

## 2020-11-21 MED ORDER — PREDNISONE 10 MG (21) PO TBPK: ORAL_TABLET | ORAL | 0 refills | Status: DC

## 2020-11-21 NOTE — ED Triage Notes (Signed)
Patient states that she got stung by a wasp on her right eyelid on Thursday.  Patient states that she has been taking Benadryl.  Patient states that the redness and swelling has been spreading.

## 2020-11-21 NOTE — Discharge Instructions (Addendum)
Take over-the-counter Allegra 180 mg daily or Zyrtec or Claritin 10 mg daily to help with your itching.  You can take over-the-counter Benadryl, 50 mg at bedtime, as needed for itching and sleep.  Take the prednisone pack according to the package instructions.  You will taken on tapering dose over a period of 6 days.  Take it with food and always take it first in the morning with breakfast.  Take over-the-counter Pepcid 20 mg twice daily to help with itching as well.  If you develop any swelling of your lips or tongue, tightness in your throat, or difficulty breathing you need to go to the ER for evaluation.  

## 2020-11-21 NOTE — ED Provider Notes (Signed)
[facility]    CSN: [id number] Arrival date & time: 11/21/20  1055      History   Chief Complaint Chief Complaint  Patient presents with   Insect Bite    Right eye    HPI Tami Williams is a 55 y.o. female.   HPI  55 year old female here for evaluation of wasp sting.  Patient reports that she was stung on her right upper eyelid 2 days ago by a wasp while At [facility].  She states that her eyes started to swell immediately.  She is also having tenderness in the upper part of her eyelid and itching developed today.  She states that she has been using Benadryl and ice at home.  Patient denies any changes to her vision.  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   Anxiety disorder 03/20/2015   Acid reflux 09/23/2007   Gastro-esophageal reflux disease without esophagitis 09/23/2007    Past Surgical History:  Procedure Laterality Date   24 HOUR PH STUDY N/A 01/06/2016   Procedure: 24 HOUR PH STUDY;  Surgeon: Midge Minium, MD;  Location: [facility] ENDOSCOPY;  Service: Endoscopy;  Laterality: N/A;   ABDOMINAL HYSTERECTOMY     due to endometriosis   ESOPHAGEAL MANOMETRY N/A 01/06/2016   Procedure: ESOPHAGEAL MANOMETRY (EM);  Surgeon: Midge Minium, MD;  Location: [facility] ENDOSCOPY;  Service: Endoscopy;  Laterality: N/A;   ESOPHAGOGASTRODUODENOSCOPY     ESOPHAGOGASTRODUODENOSCOPY (EGD) WITH PROPOFOL N/A 12/02/2015    Procedure: ESOPHAGOGASTRODUODENOSCOPY (EGD) WITH PROPOFOL;  Surgeon: Scot Jun, MD;  Location: [facility] ENDOSCOPY;  Service: Endoscopy;  Laterality: N/A;   FOOT SURGERY Left 04/2018   NISSEN FUNDOPLICATION  2007   dr. Katrinka Blazing    OB History     Gravida  3   Para  3   Term      Preterm      AB      Living         SAB      IAB      Ectopic      Multiple      Live Births               Home Medications    Prior to Admission medications   Medication Sig Start Date End Date Taking? Authorizing Provider  esomeprazole (NEXIUM) 40 MG capsule Take by mouth. 10/08/20 01/06/21 Yes [provider]  predniSONE (STERAPRED UNI-PAK 21 TAB) 10 MG (21) TBPK tablet Take 6 tablets on day 1, 5 tablets day 2, 4 tablets day 3, 3 tablets day 4, 2 tablets day 5, 1 tablet day 6 11/21/20  Yes Becky Augusta, NP  SYNTHROID 50 MCG tablet TK 1 T PO QD 08/03/15  Yes [provider]  traZODone (DESYREL) 50 MG tablet Take 1-2 tablets (50-100 mg total) by mouth at bedtime as needed for sleep. 07/15/19  Yes Joycelyn Man M, PA-C  ALPRAZolam Prudy Feeler) 0.25 MG  tablet Take 1 tablet (0.25 mg total) by mouth 2 (two) times daily as needed for anxiety. 09/27/19   Margaretann Loveless, PA-C  fluticasone (FLONASE) 50 MCG/ACT nasal spray Place 2 sprays into both nostrils daily. 03/19/19   Erasmo Downer, MD  lactulose (CHRONULAC) 10 GM/15ML solution Take by mouth. 07/05/19   [provider]  ondansetron (ZOFRAN) 4 MG tablet Take 1 tablet (4 mg total) by mouth every 8 (eight) hours as needed. 06/06/19   Margaretann Loveless, PA-C    Family History Family History  Problem Relation Age of Onset   Hypertension Mother    Heart failure Mother        Defib & pacemaker implant    Heart disease Mother    Diabetes Mother    Hypertension Father    Healthy Brother    Healthy Brother    Breast cancer Neg Hx     Social History Social History   Tobacco Use   Smoking status: Never    Smokeless tobacco: Never  Vaping Use   Vaping Use: Never used  Substance Use Topics   Alcohol use: Yes    Alcohol/week: 0.0 standard drinks    Comment: occasional   Drug use: No     Allergies   No known allergies   Review of Systems Review of Systems  Constitutional:  Negative for fever.  HENT:  Positive for facial swelling. Negative for trouble swallowing.   Eyes:  Negative for pain, discharge, itching and visual disturbance.  Respiratory:  Negative for shortness of breath, wheezing and stridor.   Skin:  Positive for wound.    Physical Exam Triage Vital Signs ED Triage Vitals [11/21/20 1111]  Enc Vitals Group     BP      Pulse      Resp      Temp      Temp src      SpO2      Weight 130 lb (59 kg)     Height 5\' 4"  (1.626 m)     Head Circumference      Peak Flow      Pain Score 7     Pain Loc      Pain Edu?      Excl. in GC?    No data found.  Updated Vital Signs BP (!) 161/96 (BP Location: Left Arm)   Pulse 60   Temp 98.5 F (36.9 C) (Oral)   Resp 14   Ht 5\' 4"  (1.626 m)   Wt 130 lb (59 kg)   SpO2 96%   BMI 22.31 kg/m   Visual Acuity Right Eye Distance:   Left Eye Distance:   Bilateral Distance:    Right Eye Near:   Left Eye Near:    Bilateral Near:     Physical Exam Vitals and nursing note reviewed.  Constitutional:      General: She is not in acute distress.    Appearance: Normal appearance. She is normal weight. She is not ill-appearing.  HENT:     Head: Normocephalic.  Eyes:     General:        Right eye: No discharge.        Left eye: No discharge.     Extraocular Movements: Extraocular movements intact.     Pupils: Pupils are equal, round, and reactive to light.     Comments: Patient has swelling of both the upper and lower periorbital region of the right eye and swelling of  the lower periorbital region of the left eye.  Musculoskeletal:        General: Swelling present.  Skin:    General: Skin is warm and dry.     Capillary  Refill: Capillary refill takes less than 2 seconds.     Findings: No bruising or erythema.  Neurological:     General: No focal deficit present.     Mental Status: She is alert and oriented to person, place, and time.  Psychiatric:        Mood and Affect: Mood normal.        Behavior: Behavior normal.        Thought Content: Thought content normal.        Judgment: Judgment normal.     UC Treatments / Results  Labs (all labs ordered are listed, but only abnormal results are displayed) Labs Reviewed - No data to display  EKG   Radiology No results found.  Procedures Procedures (including critical care time)  Medications Ordered in UC Medications - No data to display  Initial Impression / Assessment and Plan / UC Course  I have reviewed the triage vital signs and the nursing notes.  Pertinent labs & imaging results that were available during my care of the patient were reviewed by me and considered in my medical decision making (see chart for details).  Pleasant and nontoxic-appearing 55 year old female here for evaluation of wasp sting to the right upper eye as outlined in HPI above.  Patient's physical exam reveals the patient is not in any respiratory distress with no swelling of the lips or tongue.  No stridor.  Able to communicate in full sentences.  There is swelling to the periorbital region of the right eye and the inferior periorbital region of the left eye.  There is no ecchymosis, erythema, or induration.  There is no fluctuance.  Patient's pupils are equal round and reactive and her EOM is intact.  There is no discharge from either eye.  There is a scabbed over puncture site in the center of the right upper periorbital region.  When visualized under magnification there is no stinger remaining in the wound.  Patient has no previous history of allergies to bee or wasp stings but she has never been stung before either.  We will treat patient with prednisone pack, OTC  antihistamines, and Pepcid.   Final Clinical Impressions(s) / UC Diagnoses   Final diagnoses:  Right facial swelling  Wasp sting, accidental or unintentional, initial encounter  Allergic reaction, initial encounter     Discharge Instructions      Take over-the-counter Allegra 180 mg daily or Zyrtec or Claritin 10 mg daily to help with your itching.  You can take over-the-counter Benadryl, 50 mg at bedtime, as needed for itching and sleep.  Take the prednisone pack according to the package instructions.  You will taken on tapering dose over a period of 6 days.  Take it with food and always take it first in the morning with breakfast.  Take over-the-counter Pepcid 20 mg twice daily to help with itching as well.  If you develop any swelling of your lips or tongue, tightness in your throat, or difficulty breathing you need to go to the ER for evaluation.      ED Prescriptions     Medication Sig Dispense Auth. Provider   predniSONE (STERAPRED UNI-PAK 21 TAB) 10 MG (21) TBPK tablet Take 6 tablets on day 1, 5 tablets day 2, 4 tablets day 3,  3 tablets day 4, 2 tablets day 5, 1 tablet day 6 21 tablet Becky Augustayan, Lanyla Costello, NP      PDMP not reviewed this encounter.   Becky Augustayan, Ayauna Mcnay, NP 11/21/20 1128

## 2020-11-27 ENCOUNTER — Other Ambulatory Visit: Payer: Self-pay | Admitting: Physician Assistant

## 2020-11-27 DIAGNOSIS — F4321 Adjustment disorder with depressed mood: Secondary | ICD-10-CM

## 2020-11-27 NOTE — Telephone Encounter (Signed)
Pt called in for assistance. Pt says that she was prescribed ALPRAZolam (XANAX) 0.25 MG tablet when her father passed by previous provider, Victorino Dike. Pt says that she is starting to have the same antious feeling and would like to know if a provider could send in a Rx for medication?   Pharmacy:  [id number], [place] - [address] AT [facility] [address] Phone:  [phone]  Fax:  [phone]      Please assist.

## 2020-11-30 NOTE — Telephone Encounter (Signed)
Pt very upset that her call has not been returned. Pt stated she is glad she was not suicidal because there is no reason or excuse that her call was not returned. Pt requests call back asap.

## 2020-12-01 ENCOUNTER — Telehealth: Payer: Self-pay

## 2020-12-01 NOTE — Telephone Encounter (Signed)
I don't see a message from Friday. Do you?  Also, Maralyn Sago, another for service recovery.

## 2020-12-01 NOTE — Telephone Encounter (Signed)
[facility] faxed refill request for the following medications:  traZODone (DESYREL) 50 MG tablet  Please advise. 

## 2020-12-01 NOTE — Telephone Encounter (Signed)
Please review request last office visit to address was 05/12/20, labs last drawn 05/12/20, prescription was last filled 07/15/19. Reviewing chart I do not see that patient has a future appt scheduled. KW

## 2020-12-01 NOTE — Telephone Encounter (Signed)
Called patient back to advise and schedule follow up visit. Patient wanted to let you know that she is "upset" she states that she sent a high priority message on Friday and states that she was told by our team that you were not in office. Patient states that she was dealing with a crisis and did not feel that she was a priority. Patient states that she went to [facility] for care and states that she will no longer be a patient at our practice. KW

## 2020-12-01 NOTE — Telephone Encounter (Signed)
Ok to refill 30 d supply and let her know that she needs to schedule f/u appt with any provider

## 2020-12-04 MED ORDER — ALPRAZOLAM 0.25 MG PO TABS: 0.2500 mg | ORAL_TABLET | Freq: Two times a day (BID) | ORAL | 0 refills | Status: DC | PRN

## 2020-12-04 NOTE — Telephone Encounter (Signed)
Left message for patient

## 2020-12-04 NOTE — Telephone Encounter (Signed)
No further refills without being seen first.

## 2021-01-05 ENCOUNTER — Other Ambulatory Visit: Payer: Self-pay

## 2021-01-05 ENCOUNTER — Ambulatory Visit: Payer: BC Managed Care – PPO | Admitting: Family Medicine

## 2021-01-05 ENCOUNTER — Encounter: Payer: Self-pay | Admitting: Family Medicine

## 2021-01-05 VITALS — BP 148/96 | HR 60 | Temp 97.9°F | Resp 16 | Wt 128.8 lb

## 2021-01-05 DIAGNOSIS — F411 Generalized anxiety disorder: Secondary | ICD-10-CM

## 2021-01-05 DIAGNOSIS — E038 Other specified hypothyroidism: Secondary | ICD-10-CM | POA: Diagnosis not present

## 2021-01-05 DIAGNOSIS — I10 Essential (primary) hypertension: Secondary | ICD-10-CM

## 2021-01-05 DIAGNOSIS — N951 Menopausal and female climacteric states: Secondary | ICD-10-CM

## 2021-01-05 DIAGNOSIS — G479 Sleep disorder, unspecified: Secondary | ICD-10-CM

## 2021-01-05 MED ORDER — TRAZODONE HCL 50 MG PO TABS: 50.0000 mg | ORAL_TABLET | Freq: Every evening | ORAL | 1 refills | Status: AC | PRN

## 2021-01-05 MED ORDER — FLUTICASONE PROPIONATE 50 MCG/ACT NA SUSP: 2.0000 | Freq: Every day | NASAL | 11 refills | Status: AC

## 2021-01-05 MED ORDER — LISINOPRIL 10 MG PO TABS: 10.0000 mg | ORAL_TABLET | Freq: Every day | ORAL | 3 refills | Status: DC

## 2021-01-05 MED ORDER — PAROXETINE HCL 10 MG PO TABS: 10.0000 mg | ORAL_TABLET | Freq: Every day | ORAL | 1 refills | Status: AC

## 2021-01-05 NOTE — Assessment & Plan Note (Signed)
Uncontrolled Resume lisinopril 10mg  daily Recheck metabolic panel

## 2021-01-05 NOTE — Assessment & Plan Note (Signed)
Longstanding issue, but worsening recently She is still grieving the loss of her dad She started therapy last week No longer xanax as she has run out of refills - only took it for crying spells in the past Will Start Paxil 10 mg daily Discussed potential side effects, incl GI upset, sexual dysfunction, and SI Discussed that it can take 6-8 weeks to reach full efficacy Contracted for safety - no SI/HI Discussed synergistic effects of medications and therapy

## 2021-01-05 NOTE — Progress Notes (Signed)
Established patient visit   Patient: Tami Williams   DOB: [birth year]   55 y.o. Female  MRN: [id number] Visit Date: 01/05/2021  Today's healthcare provider: Shirlee Latch, MD   Chief Complaint  Patient presents with   Hypertension   Anxiety   Subjective  -------------------------------------------------------------------------------------------------------------------- Anxiety Presents for initial visit. Onset was 1 to 5 years ago. The problem has been gradually worsening. Symptoms include compulsions, depressed mood, excessive worry, irritability, muscle tension, nervous/anxious behavior, panic and restlessness. Patient reports no chest pain, confusion, decreased concentration, dizziness, dry mouth, feeling of choking, hyperventilation, impotence, insomnia, malaise, nausea, obsessions, palpitations, shortness of breath or suicidal ideas. Symptoms occur most days. The quality of sleep is fair.   Risk factors include family history and a major life event.  Hypertension Associated symptoms include anxiety. Pertinent negatives include no chest pain, headaches, neck pain, palpitations or shortness of breath.    Hypertension, follow-up  BP Readings from Last 3 Encounters:  01/05/21 (!) 148/96  11/21/20 (!) 161/96  05/12/20 116/80   Wt Readings from Last 3 Encounters:  01/05/21 128 lb 12.8 oz (58.4 kg)  11/21/20 130 lb (59 kg)  05/12/20 121 lb 12.8 oz (55.2 kg)     She was last seen for hypertension 9 months ago.  BP at that visit was 116/80. Management since that visit includes patient to continue Lisinopril, patient states since last office visit her endocrinologist d/c Lisinopril.  She has a Family Hx of hypertension. She is agreeable to beginning Lisinopril again.   She reports poor compliance with treatment. She is not having side effects.  She is following a Regular diet. She is not exercising. She does not smoke.  Use of agents associated with hypertension:  none.   Outside blood pressures are not being checked. Symptoms: No chest pain No chest pressure  No palpitations No syncope  No dyspnea No orthopnea  No paroxysmal nocturnal dyspnea No lower extremity edema   Anxiety She reports she has always had anxiety but since the passing of her father 2 yrs ago she feel it has worsened. When she is trying to relax a small task make her anxious until the task is completed. She began seeing a therapist 2 wks ago.   She was taking xanax for anxiety. She would take xanax when need/when triggered. She was taking paxil in the past but she can't remember how it went. At that point she wasn't ready to be on medication. She has reached a point that she can't cope on her own.   Menopause Progression since onset of anxiety had decreased her libido. She was unsure if the symptoms were associated with menopause. She notice that she becomes more irritable.   Insomnia  She is has insomnia secondary to anxiety. She currently takes trazodone but this hasn't improved sleep.   Pertinent labs: Lab Results  Component Value Date   CHOL 207 (H) 06/15/2018   HDL 86 06/15/2018   LDLCALC 106 (H) 06/15/2018   TRIG 77 06/15/2018   CHOLHDL 2.4 06/15/2018   Lab Results  Component Value Date   NA 138 06/28/2019   K 4.7 06/28/2019   CREATININE 0.73 06/28/2019   GFRNONAA >60 06/28/2019   GFRAA >60 06/28/2019   GLUCOSE 100 (H) 06/28/2019     The 10-year ASCVD risk score Denman George DC Jr., et al., 2013) is: 1.9%   ---------------------------------------------------------------------------------------------------      Medications: Outpatient Medications Prior to Visit  Medication Sig   ALPRAZolam Prudy Feeler)  0.25 MG tablet Take 1 tablet (0.25 mg total) by mouth 2 (two) times daily as needed for anxiety.   esomeprazole (NEXIUM) 40 MG capsule Take by mouth.   lactulose (CHRONULAC) 10 GM/15ML solution Take by mouth.   ondansetron (ZOFRAN) 4 MG tablet Take 1 tablet (4 mg  total) by mouth every 8 (eight) hours as needed.   SYNTHROID 50 MCG tablet TK 1 T PO QD   [DISCONTINUED] fluticasone (FLONASE) 50 MCG/ACT nasal spray Place 2 sprays into both nostrils daily.   [DISCONTINUED] predniSONE (STERAPRED UNI-PAK 21 TAB) 10 MG (21) TBPK tablet Take 6 tablets on day 1, 5 tablets day 2, 4 tablets day 3, 3 tablets day 4, 2 tablets day 5, 1 tablet day 6   [DISCONTINUED] traZODone (DESYREL) 50 MG tablet Take 1-2 tablets (50-100 mg total) by mouth at bedtime as needed for sleep.   No facility-administered medications prior to visit.    Review of Systems  Constitutional:  Positive for irritability. Negative for chills, fatigue and fever.  HENT:  Negative for ear pain, sinus pressure, sinus pain and sore throat.   Eyes:  Negative for pain and visual disturbance.  Respiratory:  Negative for cough, chest tightness, shortness of breath and wheezing.   Cardiovascular:  Negative for chest pain, palpitations and leg swelling.  Gastrointestinal:  Negative for abdominal pain, blood in stool, diarrhea, nausea and vomiting.  Genitourinary:  Negative for flank pain, frequency, impotence, pelvic pain and urgency.  Musculoskeletal:  Negative for back pain, myalgias and neck pain.  Neurological:  Negative for dizziness, weakness, light-headedness, numbness and headaches.  Psychiatric/Behavioral:  Negative for confusion, decreased concentration and suicidal ideas. The patient is nervous/anxious. The patient does not have insomnia.       Objective  -------------------------------------------------------------------------------------------------------------------- BP (!) 148/96   Pulse 60   Temp 97.9 F (36.6 C) (Oral)   Resp 16   Wt 128 lb 12.8 oz (58.4 kg)   SpO2 100%   BMI 22.11 kg/m     Physical Exam Vitals reviewed.  Constitutional:      General: She is not in acute distress.    Appearance: Normal appearance. She is well-developed. She is not diaphoretic.  HENT:      Head: Normocephalic and atraumatic.  Eyes:     General: No scleral icterus.    Conjunctiva/sclera: Conjunctivae normal.  Neck:     Thyroid: No thyromegaly.  Cardiovascular:     Rate and Rhythm: Normal rate and regular rhythm.     Pulses: Normal pulses.     Heart sounds: Normal heart sounds. No murmur heard. Pulmonary:     Effort: Pulmonary effort is normal. No respiratory distress.     Breath sounds: Normal breath sounds. No wheezing, rhonchi or rales.  Musculoskeletal:     Cervical back: Neck supple.     Right lower leg: No edema.     Left lower leg: No edema.  Lymphadenopathy:     Cervical: No cervical adenopathy.  Skin:    General: Skin is warm and dry.     Findings: No rash.  Neurological:     Mental Status: She is alert and oriented to person, place, and time. Mental status is at baseline.  Psychiatric:        Mood and Affect: Mood normal.        Behavior: Behavior normal.     No results found for any visits on 01/05/21.  Assessment & Plan  ---------------------------------------------------------------------------------------------------------------------- Problem List Items Addressed This Visit  Cardiovascular and Mediastinum   Hypertension - Primary    Uncontrolled Resume lisinopril 10mg  daily Recheck metabolic panel      Relevant Medications   lisinopril (ZESTRIL) 10 MG tablet   Other Relevant Orders   Comprehensive metabolic panel   Lipid panel     Endocrine   Subclinical hypothyroidism    Followed by endocrinology        Other   GAD (generalized anxiety disorder)    Longstanding issue, but worsening recently She is still grieving the loss of her dad She started therapy last week No longer xanax as she has run out of refills - only took it for crying spells in the past Will Start Paxil 10 mg daily Discussed potential side effects, incl GI upset, sexual dysfunction, and SI Discussed that it can take 6-8 weeks to reach full  efficacy Contracted for safety - no SI/HI Discussed synergistic effects of medications and therapy       Relevant Medications   PARoxetine (PAXIL) 10 MG tablet   traZODone (DESYREL) 50 MG tablet   Perimenopausal symptoms    Discussed that paxil can help with this as well      Other Visit Diagnoses     Difficulty sleeping       Relevant Medications   traZODone (DESYREL) 50 MG tablet       Return in about 4 weeks (around 02/02/2021) for chronic disease f/u, With new PCP.      I,Essence Turner,acting as a 02/04/2021 for Neurosurgeon, MD.,have documented all relevant documentation on the behalf of Shirlee Latch, MD,as directed by  Shirlee Latch, MD while in the presence of Shirlee Latch, MD.  I, Shirlee Latch, MD, have reviewed all documentation for this visit. The documentation on 01/05/21 for the exam, diagnosis, procedures, and orders are all accurate and complete.   Jsean Taussig, 01/07/21, MD, MPH [facility]

## 2021-01-05 NOTE — Assessment & Plan Note (Signed)
Followed by endocrinology 

## 2021-01-05 NOTE — Assessment & Plan Note (Signed)
Discussed that paxil can help with this as well

## 2021-01-06 LAB — COMPREHENSIVE METABOLIC PANEL
ALT: 26 IU/L (ref 0–32)
AST: 37 IU/L (ref 0–40)
Albumin/Globulin Ratio: 2.2 (ref 1.2–2.2)
Albumin: 4.7 g/dL (ref 3.8–4.9)
Alkaline Phosphatase: 116 IU/L (ref 44–121)
BUN/Creatinine Ratio: 20 (ref 9–23)
BUN: 17 mg/dL (ref 6–24)
Bilirubin Total: 0.3 mg/dL (ref 0.0–1.2)
CO2: 24 mmol/L (ref 20–29)
Calcium: 9.9 mg/dL (ref 8.7–10.2)
Chloride: 99 mmol/L (ref 96–106)
Creatinine, Ser: 0.86 mg/dL (ref 0.57–1.00)
Globulin, Total: 2.1 g/dL (ref 1.5–4.5)
Glucose: 82 mg/dL (ref 65–99)
Potassium: 4.3 mmol/L (ref 3.5–5.2)
Sodium: 139 mmol/L (ref 134–144)
Total Protein: 6.8 g/dL (ref 6.0–8.5)
eGFR: 81 mL/min/{1.73_m2} (ref >59)

## 2021-01-06 LAB — LIPID PANEL
Chol/HDL Ratio: 2.8 ratio (ref 0.0–4.4)
Cholesterol, Total: 220 mg/dL — ABNORMAL HIGH (ref 100–199)
HDL: 78 mg/dL (ref >39)
LDL Chol Calc (NIH): 130 mg/dL — ABNORMAL HIGH (ref 0–99)
Triglycerides: 70 mg/dL (ref 0–149)
VLDL Cholesterol Cal: 12 mg/dL (ref 5–40)

## 2021-02-03 ENCOUNTER — Other Ambulatory Visit: Payer: Self-pay | Admitting: Family Medicine

## 2021-02-03 ENCOUNTER — Telehealth: Payer: Self-pay

## 2021-02-03 DIAGNOSIS — B009 Herpesviral infection, unspecified: Secondary | ICD-10-CM

## 2021-02-03 MED ORDER — VALACYCLOVIR HCL 1 G PO TABS: 1000.0000 mg | ORAL_TABLET | Freq: Two times a day (BID) | ORAL | 0 refills | Status: AC

## 2021-02-03 NOTE — Telephone Encounter (Signed)
Copied from CRM [id number]. Topic: General - Other >> Feb 03, 2021 10:11 AM Jaquita Rector A wrote: Reason for CRM: Patient called in to inform Merita Norton that she have about 4 fever blisters and need an Rx called in for it. Would like an answer please call Ph# [phone]

## 2021-02-05 ENCOUNTER — Ambulatory Visit: Payer: BC Managed Care – PPO | Admitting: Family Medicine

## 2021-02-12 ENCOUNTER — Ambulatory Visit: Payer: BC Managed Care – PPO | Admitting: Family Medicine

## 2021-02-23 ENCOUNTER — Ambulatory Visit: Payer: BC Managed Care – PPO | Admitting: Family Medicine

## 2021-02-23 ENCOUNTER — Other Ambulatory Visit: Payer: Self-pay

## 2021-02-23 ENCOUNTER — Encounter: Payer: Self-pay | Admitting: Family Medicine

## 2021-02-23 VITALS — BP 121/88 | HR 65 | Temp 98.1°F | Wt 126.8 lb

## 2021-02-23 DIAGNOSIS — K5904 Chronic idiopathic constipation: Secondary | ICD-10-CM

## 2021-02-23 DIAGNOSIS — R110 Nausea: Secondary | ICD-10-CM | POA: Diagnosis not present

## 2021-02-23 DIAGNOSIS — I10 Essential (primary) hypertension: Secondary | ICD-10-CM | POA: Diagnosis not present

## 2021-02-23 DIAGNOSIS — F411 Generalized anxiety disorder: Secondary | ICD-10-CM

## 2021-02-23 MED ORDER — ONDANSETRON HCL 4 MG PO TABS: 4.0000 mg | ORAL_TABLET | Freq: Three times a day (TID) | ORAL | 5 refills | Status: AC | PRN

## 2021-02-23 MED ORDER — LACTULOSE 10 GM/15ML PO SOLN: 10.0000 g | Freq: Every day | ORAL | 1 refills | Status: DC | PRN

## 2021-02-23 MED ORDER — HYDROXYZINE PAMOATE 25 MG PO CAPS: 25.0000 mg | ORAL_CAPSULE | Freq: Three times a day (TID) | ORAL | 0 refills | Status: DC | PRN

## 2021-02-23 NOTE — Assessment & Plan Note (Signed)
Request for lactulose for PRN constipation given hx of ileus and hx of hiatal hernias

## 2021-02-23 NOTE — Assessment & Plan Note (Signed)
Intermittent nausea, request for refill of anti-emetic

## 2021-02-23 NOTE — Progress Notes (Signed)
Established patient visit   Patient: Tami Williams   DOB: [birth year]   55 y.o. Female  MRN: [id number] Visit Date: 02/23/2021  Today's healthcare provider: Gwyneth Sprout, FNP   Chief Complaint  Patient presents with   Hypertension   Hypothyroidism   Anxiety   Subjective    HPI  Hypertension, follow-up  BP Readings from Last 3 Encounters:  02/23/21 121/88  01/05/21 (!) 148/96  11/21/20 (!) 161/96   Wt Readings from Last 3 Encounters:  02/23/21 126 lb 12.8 oz (57.5 kg)  01/05/21 128 lb 12.8 oz (58.4 kg)  11/21/20 130 lb (59 kg)     She was last seen for hypertension 2 months ago.  BP at that visit was 149/86. Management since that visit includes resume Lisinopril 46m.  She reports excellent compliance with treatment. She is not having side effects.  She is following a Regular diet. She is not exercising. She does not smoke.  Use of agents associated with hypertension: none.   Outside blood pressures are not being checked. Symptoms: No chest pain No chest pressure  No palpitations No syncope  No dyspnea No orthopnea  No paroxysmal nocturnal dyspnea No lower extremity edema   Pertinent labs: Lab Results  Component Value Date   CHOL 220 (H) 01/05/2021   HDL 78 01/05/2021   LDLCALC 130 (H) 01/05/2021   TRIG 70 01/05/2021   CHOLHDL 2.8 01/05/2021   Lab Results  Component Value Date   NA 139 01/05/2021   K 4.3 01/05/2021   CREATININE 0.86 01/05/2021   EGFR 81 01/05/2021   GFRNONAA >60 06/28/2019   GLUCOSE 82 01/05/2021     The 10-year ASCVD risk score (Arnett DK, et al., 2019) is: 1.5%   ---------------------------------------------------------------------------------------------------  Hypothyroid, follow-up  Lab Results  Component Value Date   TSH 7.200 (H) 03/20/2015   TSH 7.05 (A) 01/29/2013   Wt Readings from Last 3 Encounters:  02/23/21 126 lb 12.8 oz (57.5 kg)  01/05/21 128 lb 12.8 oz (58.4 kg)  11/21/20 130 lb (59 kg)    She  was last seen for hypothyroid 2 months ago.  Management since that visit includes none, followed by Endocrinology. She reports excellent compliance with treatment. She is not having side effects.   Symptoms: Yes change in energy level Yes constipation  No diarrhea No heat / cold intolerance  Yes nervousness No palpitations  No weight changes    -----------------------------------------------------------------------------------------  Anxiety, Follow-up  She was last seen for anxiety 2 months ago. Changes made at last visit include start Paxil 1108m.   She reports excellent compliance with treatment. She reports good tolerance of treatment. She is not having side effects.   She feels her anxiety is mild and Improved since last visit.  Symptoms: No chest pain No difficulty concentrating  No dizziness No fatigue  No feelings of losing control Yes insomnia  Yes irritable No palpitations  No panic attacks Yes racing thoughts  No shortness of breath No sweating  No tremors/shakes    GAD-7 Results GAD-7 Generalized Anxiety Disorder Screening Tool 02/22/2017 07/21/2016  1. Feeling Nervous, Anxious, or on Edge 3 2  2. Not Being Able to Stop or Control Worrying 3 1  3. Worrying Too Much About Different Things 3 2  4. Trouble Relaxing 1 2  5. Being So Restless it's Hard To Sit Still 0 0  6. Becoming Easily Annoyed or Irritable 3 3  7. Feeling Afraid As If Something  Awful Might Happen 2 0  Total GAD-7 Score 15 10  Difficulty At Work, Home, or Getting  Along With Others? - Not difficult at all    PHQ-9 Scores PHQ9 SCORE ONLY 01/05/2021 05/12/2020 08/30/2019  PHQ-9 Total Score 9 4 0    ---------------------------------------------------------------------------------------------------     Medications: Outpatient Medications Prior to Visit  Medication Sig   fluticasone (FLONASE) 50 MCG/ACT nasal spray Place 2 sprays into both nostrils daily.   lisinopril (ZESTRIL) 10 MG tablet  Take 1 tablet (10 mg total) by mouth daily.   PARoxetine (PAXIL) 10 MG tablet Take 1 tablet (10 mg total) by mouth daily.   SYNTHROID 50 MCG tablet TK 1 T PO QD   traZODone (DESYREL) 50 MG tablet Take 1-2 tablets (50-100 mg total) by mouth at bedtime as needed for sleep.   [DISCONTINUED] ALPRAZolam (XANAX) 0.25 MG tablet Take 1 tablet (0.25 mg total) by mouth 2 (two) times daily as needed for anxiety.   [DISCONTINUED] lactulose (CHRONULAC) 10 GM/15ML solution Take by mouth.   [DISCONTINUED] ondansetron (ZOFRAN) 4 MG tablet Take 1 tablet (4 mg total) by mouth every 8 (eight) hours as needed.   No facility-administered medications prior to visit.    Review of Systems     Objective    BP 121/88   Pulse 65   Temp 98.1 F (36.7 C) (Oral)   Wt 126 lb 12.8 oz (57.5 kg)   SpO2 99%   BMI 21.77 kg/m    Physical Exam Vitals and nursing note reviewed.  Constitutional:      General: She is not in acute distress.    Appearance: Normal appearance. She is normal weight. She is not ill-appearing, toxic-appearing or diaphoretic.  HENT:     Head: Normocephalic and atraumatic.  Cardiovascular:     Rate and Rhythm: Normal rate and regular rhythm.     Pulses: Normal pulses.     Heart sounds: Normal heart sounds. No murmur heard.   No friction rub. No gallop.  Pulmonary:     Effort: Pulmonary effort is normal. No respiratory distress.     Breath sounds: Normal breath sounds. No stridor. No wheezing, rhonchi or rales.  Chest:     Chest wall: No tenderness.  Abdominal:     General: Abdomen is flat. Bowel sounds are normal.     Palpations: Abdomen is soft.  Musculoskeletal:        General: No swelling, tenderness, deformity or signs of injury. Normal range of motion.     Right lower leg: No edema.     Left lower leg: No edema.  Skin:    General: Skin is warm and dry.     Capillary Refill: Capillary refill takes less than 2 seconds.     Coloration: Skin is not jaundiced or pale.      Findings: No bruising, erythema, lesion or rash.  Neurological:     General: No focal deficit present.     Mental Status: She is alert and oriented to person, place, and time. Mental status is at baseline.     Cranial Nerves: No cranial nerve deficit.     Sensory: No sensory deficit.     Motor: No weakness.     Coordination: Coordination normal.  Psychiatric:        Mood and Affect: Mood normal.        Behavior: Behavior normal.        Thought Content: Thought content normal.  Judgment: Judgment normal.    No results found for any visits on 02/23/21.  Assessment & Plan     Problem List Items Addressed This Visit       Cardiovascular and Mediastinum   Hypertension    Chronic, improving Suggest increase in ACEi Pt wishes to focus on diet/exercise at this time Goal of 10-15# wt loss        Digestive   Chronic idiopathic constipation    Request for lactulose for PRN constipation given hx of ileus and hx of hiatal hernias      Relevant Medications   lactulose (CHRONULAC) 10 GM/15ML solution     Other   GAD (generalized anxiety disorder) - Primary    Doing well with transition to paxil Does not wish to increase dose at this time Xmas is hard given it is her dad's fav time of year and they often shopped together Does not want to be on addictive medication like benzo Trial of hydroxyzine       Relevant Medications   hydrOXYzine (VISTARIL) 25 MG capsule   Nausea    Intermittent nausea, request for refill of anti-emetic      Relevant Medications   ondansetron (ZOFRAN) 4 MG tablet     Return in about 3 months (around 05/26/2021) for anxiety and depression.     Vonna Kotyk, FNP, have reviewed all documentation for this visit. The documentation on 02/23/21 for the exam, diagnosis, procedures, and orders are all accurate and complete.    Gwyneth Sprout, [place] [phone] (phone) [phone] (fax)  [place]

## 2021-02-23 NOTE — Assessment & Plan Note (Signed)
Chronic, improving Suggest increase in ACEi Pt wishes to focus on diet/exercise at this time Goal of 10-15# wt loss

## 2021-02-23 NOTE — Assessment & Plan Note (Signed)
Doing well with transition to paxil Does not wish to increase dose at this time Venita Lick is hard given it is her dad's fav time of year and they often shopped together Does not want to be on addictive medication like benzo Trial of hydroxyzine

## 2021-03-31 ENCOUNTER — Telehealth: Payer: Self-pay

## 2021-03-31 NOTE — Telephone Encounter (Signed)
Copied from CRM [id number]. Topic: Appointment Scheduling - Scheduling Inquiry for Clinic >> Mar 31, 2021 10:58 AM Gaetana Michaelis A wrote: Reason for CRM: The patient would like to be seen for personal discomfort   The discomfort occurs when they're personally active and has been going on for roughly a month  Please contact to schedule

## 2021-03-31 NOTE — Telephone Encounter (Signed)
scheduled

## 2021-04-07 ENCOUNTER — Encounter: Payer: Self-pay | Admitting: Family Medicine

## 2021-04-07 ENCOUNTER — Other Ambulatory Visit: Payer: Self-pay

## 2021-04-07 ENCOUNTER — Ambulatory Visit: Payer: BC Managed Care – PPO | Admitting: Family Medicine

## 2021-04-07 VITALS — BP 125/78 | HR 70 | Resp 16 | Wt 125.9 lb

## 2021-04-07 DIAGNOSIS — N941 Unspecified dyspareunia: Secondary | ICD-10-CM | POA: Diagnosis not present

## 2021-04-07 NOTE — Assessment & Plan Note (Signed)
Denies need for STI testing Denies change in partners Patient endorses odor- similar to that of sweaty nature, where you're not wearing enough deodorant Reassurance provided- encouraged to stop using douching and to allow the vagina to adjust to normal age related changes with menopause Nuswab+ sent, no abnormalities seen/felt on exam Encouraged use of water based lubricant and foreplay as well as ensuring that loose skin is out of the way for some sexual positions

## 2021-04-07 NOTE — Progress Notes (Signed)
nu     Established patient visit   Patient: Tami Williams   DOB: 1966-01-01   55 y.o. Female  MRN: [id number] Visit Date: 04/07/2021  Today's healthcare provider: Jacky Kindle, FNP   No chief complaint on file.  Subjective    HPI   Medications: Outpatient Medications Prior to Visit  Medication Sig   fluticasone (FLONASE) 50 MCG/ACT nasal spray Place 2 sprays into both nostrils daily.   hydrOXYzine (VISTARIL) 25 MG capsule Take 1 capsule (25 mg total) by mouth every 8 (eight) hours as needed.   lactulose (CHRONULAC) 10 GM/15ML solution Take 15 mLs (10 g total) by mouth daily as needed for mild constipation.   lisinopril (ZESTRIL) 10 MG tablet Take 1 tablet (10 mg total) by mouth daily.   ondansetron (ZOFRAN) 4 MG tablet Take 1 tablet (4 mg total) by mouth every 8 (eight) hours as needed.   PARoxetine (PAXIL) 10 MG tablet Take 1 tablet (10 mg total) by mouth daily.   SYNTHROID 50 MCG tablet TK 1 T PO QD   traZODone (DESYREL) 50 MG tablet Take 1-2 tablets (50-100 mg total) by mouth at bedtime as needed for sleep.   No facility-administered medications prior to visit.    Review of Systems     Objective    BP 125/78   Pulse 70   Resp 16   Wt 125 lb 14.4 oz (57.1 kg)   SpO2 99%   BMI 21.61 kg/m    Physical Exam Vitals and nursing note reviewed.  Constitutional:      General: She is not in acute distress.    Appearance: Normal appearance. She is normal weight. She is not ill-appearing, toxic-appearing or diaphoretic.  HENT:     Head: Normocephalic and atraumatic.  Cardiovascular:     Rate and Rhythm: Normal rate and regular rhythm.     Pulses: Normal pulses.     Heart sounds: Normal heart sounds. No murmur heard.   No friction rub. No gallop.  Pulmonary:     Effort: Pulmonary effort is normal. No respiratory distress.     Breath sounds: Normal breath sounds. No stridor. No wheezing, rhonchi or rales.  Chest:     Chest wall: No tenderness.  Abdominal:      General: Bowel sounds are normal.     Palpations: Abdomen is soft.     Hernia: There is no hernia in the left inguinal area or right inguinal area.  Genitourinary:    General: Normal vulva.     Exam position: Lithotomy position.     Pubic Area: No rash or pubic lice.      Tanner stage (genital): 5.     Labia:        Right: No rash, tenderness or injury.        Left: No rash, tenderness, lesion or injury.      Urethra: No prolapse, urethral pain, urethral swelling or urethral lesion.  Musculoskeletal:        General: No swelling, tenderness, deformity or signs of injury. Normal range of motion.     Right lower leg: No edema.     Left lower leg: No edema.  Lymphadenopathy:     Lower Body: No right inguinal adenopathy. No left inguinal adenopathy.  Skin:    General: Skin is warm and dry.     Capillary Refill: Capillary refill takes less than 2 seconds.     Coloration: Skin is not jaundiced or pale.  Findings: No bruising, erythema, lesion or rash.  Neurological:     General: No focal deficit present.     Mental Status: She is alert and oriented to person, place, and time. Mental status is at baseline.     Cranial Nerves: No cranial nerve deficit.     Sensory: No sensory deficit.     Motor: No weakness.     Coordination: Coordination normal.  Psychiatric:        Mood and Affect: Mood normal.        Behavior: Behavior normal.        Thought Content: Thought content normal.        Judgment: Judgment normal.     No results found for any visits on 04/07/21.  Assessment & Plan     Problem List Items Addressed This Visit       Other   Dyspareunia in female - Primary    Denies need for STI testing Denies change in partners Patient endorses odor- similar to that of sweaty nature, where you're not wearing enough deodorant Reassurance provided- encouraged to stop using douching and to allow the vagina to adjust to normal age related changes with menopause Nuswab+ sent, no  abnormalities seen/felt on exam Encouraged use of water based lubricant and foreplay as well as ensuring that loose skin is out of the way for some sexual positions      Relevant Orders   NuSwab Vaginitis Plus (VG+)    Return if symptoms worsen or fail to improve.      Leilani Merl, FNP, have reviewed all documentation for this visit. The documentation on 04/07/21 for the exam, diagnosis, procedures, and orders are all accurate and complete.    Jacky Kindle, FNP  [facility] [phone] (phone) [phone] (fax)  [facility]

## 2021-04-10 LAB — NUSWAB VAGINITIS PLUS (VG+)
Candida albicans, NAA: NEGATIVE
Candida glabrata, NAA: NEGATIVE
Chlamydia trachomatis, NAA: NEGATIVE
Neisseria gonorrhoeae, NAA: NEGATIVE
Trich vag by NAA: NEGATIVE

## 2021-04-21 ENCOUNTER — Other Ambulatory Visit: Payer: Self-pay | Admitting: Family Medicine

## 2021-04-21 DIAGNOSIS — N951 Menopausal and female climacteric states: Secondary | ICD-10-CM

## 2021-04-21 DIAGNOSIS — N898 Other specified noninflammatory disorders of vagina: Secondary | ICD-10-CM

## 2021-05-14 ENCOUNTER — Encounter: Payer: Self-pay | Admitting: Physician Assistant

## 2021-05-19 ENCOUNTER — Encounter: Payer: BC Managed Care – PPO | Admitting: Obstetrics and Gynecology

## 2021-05-24 ENCOUNTER — Other Ambulatory Visit: Payer: Self-pay | Admitting: Family Medicine

## 2021-06-14 ENCOUNTER — Encounter: Payer: Self-pay | Admitting: Obstetrics and Gynecology

## 2021-06-14 ENCOUNTER — Other Ambulatory Visit: Payer: Self-pay

## 2021-06-14 ENCOUNTER — Ambulatory Visit: Payer: BC Managed Care – PPO | Admitting: Obstetrics and Gynecology

## 2021-06-14 VITALS — BP 122/70 | Ht 64.0 in | Wt 124.4 lb

## 2021-06-14 DIAGNOSIS — Z1231 Encounter for screening mammogram for malignant neoplasm of breast: Secondary | ICD-10-CM

## 2021-06-14 DIAGNOSIS — N952 Postmenopausal atrophic vaginitis: Secondary | ICD-10-CM | POA: Diagnosis not present

## 2021-06-14 MED ORDER — ESTRADIOL 0.1 MG/GM VA CREA: TOPICAL_CREAM | VAGINAL | 6 refills | Status: DC

## 2021-06-14 NOTE — Progress Notes (Signed)
Patient ID: Tami Williams, female   DOB: Mar 21, 1966, 56 y.o.   MRN: [id number]  Reason for Consult: Gynecologic Exam (Pt states she has pain during sex. Pt states she has seen her PCP and she has been tested for STIs . She was told that maybe her bladder has shifted.)   Referred by Tami Sprout, FNP  Subjective:     HPI:  Tami Williams is a 56 y.o. female she is here today for consultation regarding pain with intercourse.  She reports that she has been having pain with intercourse for several months.  She reports that the pain will linger after intercourse.  She reports that last week was the first time that she noticed small amounts of pink spotting after intercourse.  She reports that she had a hysterectomy when she was in her 38s for endometriosis.  She reports that she has 1 remaining ovary and fallopian tube which she believes is on her right side.  She no longer has her uterus or cervix.  She last had a Pap smear in 05/12/2020 that was NIL.   She reports that she has been having hot flashes for about 1 and half years.  She reports that these hot flashes can bothersome.  She tried Paxil in the past which did improve her hot flashes but she is not interested in taking this medication or pursuing treatment for her hot flashes at this time.  Gynecological History  No LMP recorded. Patient has had a hysterectomy. Menarche: 17 Menopause: 52  History of fibroids, polyps, or ovarian cysts? : no  History of PCOS? no Hstory of Endometriosis? yes History of abnormal pap smears? no Have you had any sexually transmitted infections in the past? no  Last Pap: Results were: 2021 NIL  She identifies as a female. She is sexually active with men.   She has dyspareunia. She has postcoital bleeding.  She currently uses none for contraception.   Obstetrical History OB History  Gravida Para Term Preterm AB Living  3 3 2 1   3   SAB IAB Ectopic Multiple Live Births          3    # Outcome  Date GA Lbr Len/2nd Weight Sex Delivery Anes PTL Lv  3 Term 11/04/88     Vag-Spont   LIV  2 Term 05/31/86    M Vag-Spont   LIV  1 Preterm 09/15/84    M Vag-Spont   LIV     Past Medical History:  Diagnosis Date   Acute epigastric pain    GAD (generalized anxiety disorder)    GERD (gastroesophageal reflux disease)    Hypertension    Non-cardiac chest pain    Family History  Problem Relation Age of Onset   Hypertension Mother    Heart failure Mother        Defib & pacemaker implant    Heart disease Mother    Diabetes Mother    Hypertension Father    Healthy Brother    Healthy Brother    Breast cancer Neg Hx    Past Surgical History:  Procedure Laterality Date   26 HOUR Desha STUDY N/A 01/06/2016   Procedure: 24 HOUR PH STUDY;  Surgeon: Lucilla Lame, MD;  Location: [facility] ENDOSCOPY;  Service: Endoscopy;  Laterality: N/A;   ABDOMINAL HYSTERECTOMY     due to endometriosis   ESOPHAGEAL MANOMETRY N/A 01/06/2016   Procedure: ESOPHAGEAL MANOMETRY (EM);  Surgeon: Lucilla Lame, MD;  Location: [facility] ENDOSCOPY;  Service: Endoscopy;  Laterality: N/A;   ESOPHAGOGASTRODUODENOSCOPY     ESOPHAGOGASTRODUODENOSCOPY (EGD) WITH PROPOFOL N/A 12/02/2015   Procedure: ESOPHAGOGASTRODUODENOSCOPY (EGD) WITH PROPOFOL;  Surgeon: Manya Silvas, MD;  Location: [facility] ENDOSCOPY;  Service: Endoscopy;  Laterality: N/A;   FOOT SURGERY Left [id number]   NISSEN FUNDOPLICATION  [id number]   dr. Mickle Plumb Social History:  Social History   Tobacco Use   Smoking status: Never   Smokeless tobacco: Never  Substance Use Topics   Alcohol use: Yes    Alcohol/week: 0.0 standard drinks    Comment: occasional    Allergies  Allergen Reactions   No Known Allergies     Current Outpatient Medications  Medication Sig Dispense Refill   lisinopril (ZESTRIL) 10 MG tablet Take 1 tablet (10 mg total) by mouth daily. 90 tablet 3   SYNTHROID 50 MCG tablet TK 1 T PO QD  6   traZODone (DESYREL) 50 MG tablet Take 1-2 tablets  (50-100 mg total) by mouth at bedtime as needed for sleep. 180 tablet 1   fluticasone (FLONASE) 50 MCG/ACT nasal spray Place 2 sprays into both nostrils daily. (Patient not taking: Reported on 06/14/2021) 16 g 11   hydrOXYzine (VISTARIL) 25 MG capsule Take 1 capsule (25 mg total) by mouth every 8 (eight) hours as needed. (Patient not taking: Reported on 06/14/2021) 30 capsule 0   lactulose (CHRONULAC) 10 GM/15ML solution Take 15 mLs (10 g total) by mouth daily as needed for mild constipation. (Patient not taking: Reported on 06/14/2021) 237 mL 1   ondansetron (ZOFRAN) 4 MG tablet Take 1 tablet (4 mg total) by mouth every 8 (eight) hours as needed. (Patient not taking: Reported on 06/14/2021) 20 tablet 5   PARoxetine (PAXIL) 10 MG tablet Take 1 tablet (10 mg total) by mouth daily. (Patient not taking: Reported on 06/14/2021) 30 tablet 1   No current facility-administered medications for this visit.    Review of Systems  Constitutional: Negative for chills, fatigue, fever and unexpected weight change.  HENT: Negative for trouble swallowing.  Eyes: Negative for loss of vision.  Respiratory: Negative for cough, shortness of breath and wheezing.  Cardiovascular: Negative for chest pain, leg swelling, palpitations and syncope.  GI: Negative for abdominal pain, blood in stool, diarrhea, nausea and vomiting.       + constipation GU: Positive for dysuria and frequency. Negative for difficulty urinating and hematuria.  Musculoskeletal: Negative for back pain, leg pain and joint pain.  Skin: Negative for rash.  Neurological: Negative for dizziness, headaches, light-headedness, numbness and seizures.  Psychiatric: Negative for behavioral problem, confusion, depressed mood and sleep disturbance.       Objective:  Objective   Vitals:   06/14/21 0930  BP: 122/70  Weight: 124 lb 6.4 oz (56.4 kg)  Height: 5\' 4"  (1.626 m)   Body mass index is 21.35 kg/m.  Physical Exam Vitals and nursing note  reviewed. Exam conducted with a chaperone present.  Constitutional:      Appearance: Normal appearance. She is well-developed.  HENT:     Head: Normocephalic and atraumatic.  Eyes:     Extraocular Movements: Extraocular movements intact.     Pupils: Pupils are equal, round, and reactive to light.  Cardiovascular:     Rate and Rhythm: Normal rate and regular rhythm.  Pulmonary:     Effort: Pulmonary effort is normal. No respiratory distress.     Breath sounds: Normal breath sounds.  Abdominal:     General: Abdomen  is flat.     Palpations: Abdomen is soft.  Genitourinary:    Comments: External: Normal appearing vulva. No lesions noted. Skin atrophy noted Speculum examination: Normal appearing vaginal vault No blood in the vaginal vault. No discharge. Bimanual examination: Uterus absent. No adnexal masses. No adnexal tenderness. Pelvis not fixed.  Breast exam: exam not performed Musculoskeletal:        General: No signs of injury.  Skin:    General: Skin is warm and dry.  Neurological:     Mental Status: She is alert and oriented to person, place, and time.  Psychiatric:        Behavior: Behavior normal.        Thought Content: Thought content normal.        Judgment: Judgment normal.    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 Reviewed nonhormonal nonhormonal at options for management of vulvar atrophy. Patient is interested in starting Estrace ointment.  Prescription was sent.  More than 30 minutes were spent face to face with the patient in the room, reviewing the medical record, labs and images, and coordinating care for the patient. The plan of management was discussed in detail and counseling was provided.    Adrian Prows MD [facility], [facility] 06/14/2021 9:45 AM

## 2021-06-14 NOTE — Patient Instructions (Signed)
Vaginal/ Vulvar Moisturizer Use 3-5 times a week at bedtime  Hyalo Gyn Revaree Replens Carlson Key-E suppositories Vitamin E oil, olive oil, coconut oil   Water-based Lubricants  Astroglide KY Jelly  Luvena Aquagel  Silicone- based Lubricants Pjur PINK Astroglide silicone Uberlube    

## 2021-06-16 ENCOUNTER — Telehealth: Payer: Self-pay

## 2021-06-16 DIAGNOSIS — N952 Postmenopausal atrophic vaginitis: Secondary | ICD-10-CM

## 2021-06-16 MED ORDER — ESTRADIOL 0.1 MG/GM VA CREA: TOPICAL_CREAM | VAGINAL | 6 refills | Status: DC

## 2021-06-16 MED ORDER — ESTRADIOL 0.1 MG/GM VA CREA: TOPICAL_CREAM | VAGINAL | 6 refills | Status: AC

## 2021-06-16 NOTE — Telephone Encounter (Signed)
Pt calling; [facility] [place] does not have rx that was supposed to have been sent in.  [phone]  Pt aware rx eRx'd and it went thru.

## 2021-08-06 ENCOUNTER — Other Ambulatory Visit: Payer: Self-pay

## 2021-08-06 ENCOUNTER — Ambulatory Visit
Admission: RE | Admit: 2021-08-06 | Discharge: 2021-08-06 | Disposition: A | Discharge status: Home / Self Care | Payer: BC Managed Care – PPO | Source: Ambulatory Visit | Attending: Obstetrics and Gynecology | Admitting: Obstetrics and Gynecology

## 2021-08-06 DIAGNOSIS — Z1231 Encounter for screening mammogram for malignant neoplasm of breast: Secondary | ICD-10-CM | POA: Diagnosis present

## 2021-10-23 ENCOUNTER — Other Ambulatory Visit: Payer: Self-pay | Admitting: Family Medicine

## 2021-10-23 DIAGNOSIS — G479 Sleep disorder, unspecified: Secondary | ICD-10-CM

## 2022-02-15 ENCOUNTER — Other Ambulatory Visit: Payer: Self-pay | Admitting: Family Medicine

## 2022-07-11 ENCOUNTER — Telehealth: Payer: Self-pay

## 2022-07-11 NOTE — Telephone Encounter (Signed)
Rx refill request from [facility] for estradiol 0.01%vaginal cream 42.5gm.  [phone]

## 2022-07-13 NOTE — Telephone Encounter (Signed)
Pt has been scheduled for her annual.  She wanted to wait on the refill until she sees the provider.

## 2022-08-22 NOTE — Progress Notes (Signed)
ANNUAL PREVENTATIVE CARE GYNECOLOGY  ENCOUNTER NOTE  Subjective:       Tami Williams is a 57 y.o. [phone] female here for a routine annual gynecologic exam. She was previously seen by Adelene Idler, MD of [facility]. The patient is sexually active. The patient is not taking hormone replacement therapy. Patient denies post-menopausal vaginal bleeding. The patient wears seatbelts: yes. The patient participates in regular exercise: yes. Has the patient ever been transfused or tattooed?: yes. The patient reports that there is not domestic violence in her life.  Current complaints: She has concerns about menopause. Reports a history of anxiety, however has been having significant hot flushes that she does not believe are related. She has been having severe hot flashes. She is very irritable when she has the hot flashes. She has fatigue and day time sleepiness that comes and goes.  Currently on Paxil, has been on this medication for ~ 2 years, but does not feel that it has been all that helpful.    Also complains of issues with holding her urine. Notes that she gets the urge to go suddenly, and sometimes leaks when attempting to get to the restroom.   Lastly, reports issues with vaginal atrophy and discomfort with intercourse.  Does note occasional spotting. Uses water- based lubricants.    Gynecologic History No LMP recorded. Patient has had a hysterectomy. Contraception: status post hysterectomy Last Pap: 05/12/2020. Results were: normal Last mammogram: 08/06/2021. Results were: normal Last Colonoscopy: 09/17/2019: performed at  [facility]. Unable to visualize results in Care Everywhere Last Dexa Scan: Never Done   Obstetric History OB History  Gravida Para Term Preterm AB Living  3 3 2 1   3   SAB IAB Ectopic Multiple Live Births          3    # Outcome Date GA Lbr Len/2nd Weight Sex Delivery Anes PTL Lv  3 Term 11/04/88     Vag-Spont   LIV  2 Term 05/31/86    M Vag-Spont    LIV  1 Preterm 09/15/84    M Vag-Spont   LIV    Past Medical History:  Diagnosis Date   Acute epigastric pain    GAD (generalized anxiety disorder)    GERD (gastroesophageal reflux disease)    Hypertension    Non-cardiac chest pain     Family History  Problem Relation Age of Onset   Hypertension Mother    Heart failure Mother        Defib & pacemaker implant    Heart disease Mother    Diabetes Mother    Hypertension Father    Healthy Brother    Healthy Brother    Breast cancer Neg Hx     Past Surgical History:  Procedure Laterality Date   60 HOUR PH STUDY N/A 01/06/2016   Procedure: 24 HOUR PH STUDY;  Surgeon: Midge Minium, MD;  Location: [facility] ENDOSCOPY;  Service: Endoscopy;  Laterality: N/A;   ABDOMINAL HYSTERECTOMY     due to endometriosis   ESOPHAGEAL MANOMETRY N/A 01/06/2016   Procedure: ESOPHAGEAL MANOMETRY (EM);  Surgeon: Midge Minium, MD;  Location: [facility] ENDOSCOPY;  Service: Endoscopy;  Laterality: N/A;   ESOPHAGOGASTRODUODENOSCOPY     ESOPHAGOGASTRODUODENOSCOPY (EGD) WITH PROPOFOL N/A 12/02/2015   Procedure: ESOPHAGOGASTRODUODENOSCOPY (EGD) WITH PROPOFOL;  Surgeon: Scot Jun, MD;  Location: [facility] ENDOSCOPY;  Service: Endoscopy;  Laterality: N/A;   FOOT SURGERY Left 04/2018   NISSEN FUNDOPLICATION  2007   dr. Katrinka Blazing  Social History   Socioeconomic History   Marital status: Married    Spouse name: Nasheema Cordoba   Number of children: 3   Years of education: GED   Highest education level: Not on file  Occupational History    Employer: [employer]  Tobacco Use   Smoking status: Never   Smokeless tobacco: Never  Vaping Use   Vaping Use: Never used  Substance and Sexual Activity   Alcohol use: Yes    Alcohol/week: 0.0 standard drinks of alcohol    Comment: occasional   Drug use: No   Sexual activity: Yes  Other Topics Concern   Not on file  Social History Narrative   Not on file   Social Determinants of Health   Financial Resource Strain: Not on file   Food Insecurity: Not on file  Transportation Needs: Not on file  Physical Activity: Not on file  Stress: Not on file  Social Connections: Not on file  Intimate Partner Violence: Not on file    Current Outpatient Medications on File Prior to Visit  Medication Sig Dispense Refill   fluticasone (FLONASE) 50 MCG/ACT nasal spray Place 2 sprays into both nostrils daily. (Patient not taking: Reported on 06/14/2021) 16 g 11   hydrOXYzine (VISTARIL) 25 MG capsule Take 1 capsule (25 mg total) by mouth every 8 (eight) hours as needed. (Patient not taking: Reported on 06/14/2021) 30 capsule 0   lactulose (CHRONULAC) 10 GM/15ML solution Take 15 mLs (10 g total) by mouth daily as needed for mild constipation. (Patient not taking: Reported on 06/14/2021) 237 mL 1   lisinopril (ZESTRIL) 10 MG tablet Take 1 tablet (10 mg total) by mouth daily. 90 tablet 3   ondansetron (ZOFRAN) 4 MG tablet Take 1 tablet (4 mg total) by mouth every 8 (eight) hours as needed. (Patient not taking: Reported on 06/14/2021) 20 tablet 5   PARoxetine (PAXIL) 10 MG tablet Take 1 tablet (10 mg total) by mouth daily. (Patient not taking: Reported on 06/14/2021) 30 tablet 1   SYNTHROID 50 MCG tablet TK 1 T PO QD  6   traZODone (DESYREL) 50 MG tablet Take 1-2 tablets (50-100 mg total) by mouth at bedtime as needed for sleep. 180 tablet 1   No current facility-administered medications on file prior to visit.    Allergies  Allergen Reactions   No Known Allergies       Review of Systems ROS Review of Systems - General ROS: negative for - chills, fatigue, fever, night sweats, weight gain or weight loss. Positive for hot flashes Psychological ROS: negative for - decreased libido, depression, mood swings, physical abuse or sexual abuse. Positive for anxiety Ophthalmic ROS: negative for - blurry vision, eye pain or loss of vision ENT ROS: negative for - headaches, hearing change, visual changes or vocal changes Allergy and Immunology  ROS: negative for - hives, itchy/watery eyes or seasonal allergies Hematological and Lymphatic ROS: negative for - bleeding problems, bruising, swollen lymph nodes or weight loss Endocrine ROS: negative for - galactorrhea, hair pattern changes, hot flashes, malaise/lethargy, mood swings, palpitations, polydipsia/polyuria, skin changes, temperature intolerance or unexpected weight changes Breast ROS: negative for - new or changing breast lumps or nipple discharge Respiratory ROS: negative for - cough or shortness of breath Cardiovascular ROS: negative for - chest pain, irregular heartbeat, palpitations or shortness of breath Gastrointestinal ROS: no abdominal pain, change in bowel habits, or black or bloody stools Genito-Urinary ROS: no dysuria, trouble voiding, or hematuria Musculoskeletal ROS: negative for -  joint pain or joint stiffness Neurological ROS: negative for - bowel and bladder control changes Dermatological ROS: negative for rash and skin lesion changes   Objective:   BP 128/88   Pulse 61   Resp 16   Ht 5\' 4"  (1.626 m)   Wt 120 lb 14.4 oz (54.8 kg)   BMI 20.75 kg/m  CONSTITUTIONAL: Well-developed, well-nourished female in no acute distress.  PSYCHIATRIC: Normal mood and affect. Normal behavior. Normal judgment and thought content. NEUROLGIC: Alert and oriented to person, place, and time. Normal muscle tone coordination. No cranial nerve deficit noted. HENT:  Normocephalic, atraumatic, External right and left ear normal. Oropharynx is clear and moist EYES: Conjunctivae and EOM are normal. Pupils are equal, round, and reactive to light. No scleral icterus.  NECK: Normal range of motion, supple, no masses.  Normal thyroid.  SKIN: Skin is warm and dry. No rash noted. Not diaphoretic. No erythema. No pallor. CARDIOVASCULAR: Normal heart rate noted, regular rhythm, no murmur. RESPIRATORY: Clear to auscultation bilaterally. Effort and breath sounds normal, no problems with  respiration noted. BREASTS: Symmetric in size. No masses, skin changes, nipple drainage, or lymphadenopathy. ABDOMEN: Soft, normal bowel sounds, no distention noted.  No tenderness, rebound or guarding.  BLADDER: Normal PELVIC:  Bladder no bladder distension noted  Urethra: normal appearing urethra with no masses, tenderness or lesions. Small excoriations noted bilaterally around urethra.   Vulva: normal appearing vulva with no masses, tenderness or lesions, mild atrophy  Vagina: normal appearing vagina with normal color and discharge, no lesions, moderately atrophic  Cervix: surgically absent  Uterus: surgically absent, vaginal cuff well healed  Adnexa: normal adnexa in size, nontender and no masses  RV: External Exam NormaI, No Rectal Masses, and Normal Sphincter tone  MUSCULOSKELETAL: Normal range of motion. No tenderness.  No cyanosis, clubbing, or edema.  2+ distal pulses. LYMPHATIC: No Axillary, Supraclavicular, or Inguinal Adenopathy.   Labs: Labs reviewed in Care Everywhere  Assessment:   1. Encounter for well woman exam with routine gynecological exam   2. Encounter for screening mammogram for malignant neoplasm of breast   3. Vaginal atrophy   4. Dyspareunia in female   5. Hot flashes due to menopause   6. Overactive bladder      Plan:  - Pap: Not needed. S/p hysterectomy - Mammogram: Ordered.  - Colon Screening:   UTD .  - Labs: None ordered. Up to date, reviewed in Care Everywhere.  - Routine preventative health maintenance measures emphasized:  Self Breast Exams  and Exercise/Diet/Weight control - COVID Vaccination status: completed 2 dose Moderna series, eligible for booster.  - Discussed treatment options for vaginal atrophy, dyspareunia, and hot flushes. Discussed local vaginal estrogen (or testosterone) therapy with non-hormonal management for hot flushes, vs  systemic therapy. Tried Estrace cream in the past which helped some, but was too messy so stopped after  several months of use.  Will try Estrace HRT for treatment of all symptoms.  Asked when she can discontinue Paxil therapy. Advised to remain on medication for first month, then can taper down to 1/2 dose the next month, then off by the following month.  - Discussed management options for OAB, desires conservative treatment. Given handout on lifestyle and dietary modifications  - Return to Clinic - 1 Year for annual exam. Return in 6 months to f/u HRT.    Hildred Laser, MD [facility]

## 2022-08-22 NOTE — Patient Instructions (Signed)
Preventive Care 40-55 Years Old, Female Preventive care refers to lifestyle choices and visits with your health care provider that can promote health and wellness. Preventive care visits are also called wellness exams. What can I expect for my preventive care visit? Counseling Your health care provider may ask you questions about your: Medical history, including: Past medical problems. Family medical history. Pregnancy history. Current health, including: Menstrual cycle. Method of birth control. Emotional well-being. Home life and relationship well-being. Sexual activity and sexual health. Lifestyle, including: Alcohol, nicotine or tobacco, and drug use. Access to firearms. Diet, exercise, and sleep habits. Work and work environment. Sunscreen use. Safety issues such as seatbelt and bike helmet use. Physical exam Your health care provider will check your: Height and weight. These may be used to calculate your BMI (body mass index). BMI is a measurement that tells if you are at a healthy weight. Waist circumference. This measures the distance around your waistline. This measurement also tells if you are at a healthy weight and may help predict your risk of certain diseases, such as type 2 diabetes and high blood pressure. Heart rate and blood pressure. Body temperature. Skin for abnormal spots. What immunizations do I need?  Vaccines are usually given at various ages, according to a schedule. Your health care provider will recommend vaccines for you based on your age, medical history, and lifestyle or other factors, such as travel or where you work. What tests do I need? Screening Your health care provider may recommend screening tests for certain conditions. This may include: Lipid and cholesterol levels. Diabetes screening. This is done by checking your blood sugar (glucose) after you have not eaten for a while (fasting). Pelvic exam and Pap test. Hepatitis B test. Hepatitis C  test. HIV (human immunodeficiency virus) test. STI (sexually transmitted infection) testing, if you are at risk. Lung cancer screening. Colorectal cancer screening. Mammogram. Talk with your health care provider about when you should start having regular mammograms. This may depend on whether you have a family history of breast cancer. BRCA-related cancer screening. This may be done if you have a family history of breast, ovarian, tubal, or peritoneal cancers. Bone density scan. This is done to screen for osteoporosis. Talk with your health care provider about your test results, treatment options, and if necessary, the need for more tests. Follow these instructions at home: Eating and drinking  Eat a diet that includes fresh fruits and vegetables, whole grains, lean protein, and low-fat dairy products. Take vitamin and mineral supplements as recommended by your health care provider. Do not drink alcohol if: Your health care provider tells you not to drink. You are pregnant, may be pregnant, or are planning to become pregnant. If you drink alcohol: Limit how much you have to 0-1 drink a day. Know how much alcohol is in your drink. In the [place] one drink equals one 12 oz bottle of beer (355 mL), one 5 oz glass of wine (148 mL), or one 1 oz glass of hard liquor (44 mL). Lifestyle Brush your teeth every morning and night with fluoride toothpaste. Floss one time each day. Exercise for at least 30 minutes 5 or more days each week. Do not use any products that contain nicotine or tobacco. These products include cigarettes, chewing tobacco, and vaping devices, such as e-cigarettes. If you need help quitting, ask your health care provider. Do not use drugs. If you are sexually active, practice safe sex. Use a condom or other form of protection to   prevent STIs. If you do not wish to become pregnant, use a form of birth control. If you plan to become pregnant, see your health care provider for a  prepregnancy visit. Take aspirin only as told by your health care provider. Make sure that you understand how much to take and what form to take. Work with your health care provider to find out whether it is safe and beneficial for you to take aspirin daily. Find healthy ways to manage stress, such as: Meditation, yoga, or listening to music. Journaling. Talking to a trusted person. Spending time with friends and family. Minimize exposure to UV radiation to reduce your risk of skin cancer. Safety Always wear your seat belt while driving or riding in a vehicle. Do not drive: If you have been drinking alcohol. Do not ride with someone who has been drinking. When you are tired or distracted. While texting. If you have been using any mind-altering substances or drugs. Wear a helmet and other protective equipment during sports activities. If you have firearms in your house, make sure you follow all gun safety procedures. Seek help if you have been physically or sexually abused. What's next? Visit your health care provider once a year for an annual wellness visit. Ask your health care provider how often you should have your eyes and teeth checked. Stay up to date on all vaccines. This information is not intended to replace advice given to you by your health care provider. Make sure you discuss any questions you have with your health care provider. Document Revised: 10/28/2020 Document Reviewed: 10/28/2020 Elsevier Patient Education  2023 Elsevier Inc. Breast Self-Awareness Breast self-awareness is knowing how your breasts look and feel. You need to: Check your breasts on a regular basis. Tell your doctor about any changes. Become familiar with the look and feel of your breasts. This can help you catch a breast problem while it is still small and can be treated. You should do breast self-exams even if you have breast implants. What you need: A mirror. A well-lit room. A pillow or other  soft object. How to do a breast self-exam Follow these steps to do a breast self-exam: Look for changes  Take off all the clothes above your waist. Stand in front of a mirror in a room with good lighting. Put your hands down at your sides. Compare your breasts in the mirror. Look for any difference between them, such as: A difference in shape. A difference in size. Wrinkles, dips, and bumps in one breast and not the other. Look at each breast for changes in the skin, such as: Redness. Scaly areas. Skin that has gotten thicker. Dimpling. Open sores (ulcers). Look for changes in your nipples, such as: Fluid coming out of a nipple. Fluid around a nipple. Bleeding. Dimpling. Redness. A nipple that looks pushed in (retracted), or that has changed position. Feel for changes Lie on your back. Feel each breast. To do this: Pick a breast to feel. Place a pillow under the shoulder closest to that breast. Put the arm closest to that breast behind your head. Feel the nipple area of that breast using the hand of your other arm. Feel the area with the pads of your three middle fingers by making small circles with your fingers. Use light, medium, and firm pressure. Continue the overlapping circles, moving downward over the breast. Keep making circles with your fingers. Stop when you feel your ribs. Start making circles with your fingers again, this time going   upward until you reach your collarbone. Then, make circles outward across your breast and into your armpit area. Squeeze your nipple. Check for discharge and lumps. Repeat these steps to check your other breast. Sit or stand in the tub or shower. With soapy water on your skin, feel each breast the same way you did when you were lying down. Write down what you find Writing down what you find can help you remember what to tell your doctor. Write down: What is normal for each breast. Any changes you find in each breast. These  include: The kind of changes you find. A tender or painful breast. Any lump you find. Write down its size and where it is. When you last had your monthly period (menstrual cycle). General tips If you are breastfeeding, the best time to check your breasts is after you feed your baby or after you use a breast pump. If you get monthly bleeding, the best time to check your breasts is 5-7 days after your monthly cycle ends. With time, you will become comfortable with the self-exam. You will also start to know if there are changes in your breasts. Contact a doctor if: You see a change in the shape or size of your breasts or nipples. You see a change in the skin of your breast or nipples, such as red or scaly skin. You have fluid coming from your nipples that is not normal. You find a new lump or thick area. You have breast pain. You have any concerns about your breast health. Summary Breast self-awareness includes looking for changes in your breasts and feeling for changes within your breasts. You should do breast self-awareness in front of a mirror in a well-lit room. If you get monthly periods (menstrual cycles), the best time to check your breasts is 5-7 days after your period ends. Tell your doctor about any changes you see in your breasts. Changes include changes in size, changes on the skin, painful or tender breasts, or fluid from your nipples that is not normal. This information is not intended to replace advice given to you by your health care provider. Make sure you discuss any questions you have with your health care provider. Document Revised: 10/07/2021 Document Reviewed: 03/04/2021 Elsevier Patient Education  2023 Elsevier Inc.  

## 2022-08-23 ENCOUNTER — Encounter: Payer: Self-pay | Admitting: Obstetrics and Gynecology

## 2022-08-23 ENCOUNTER — Ambulatory Visit (INDEPENDENT_AMBULATORY_CARE_PROVIDER_SITE_OTHER): Payer: BC Managed Care – PPO | Admitting: Obstetrics and Gynecology

## 2022-08-23 VITALS — BP 128/88 | HR 61 | Resp 16 | Ht 64.0 in | Wt 120.9 lb

## 2022-08-23 DIAGNOSIS — N951 Menopausal and female climacteric states: Secondary | ICD-10-CM

## 2022-08-23 DIAGNOSIS — N941 Unspecified dyspareunia: Secondary | ICD-10-CM

## 2022-08-23 DIAGNOSIS — N952 Postmenopausal atrophic vaginitis: Secondary | ICD-10-CM

## 2022-08-23 DIAGNOSIS — Z1231 Encounter for screening mammogram for malignant neoplasm of breast: Secondary | ICD-10-CM

## 2022-08-23 DIAGNOSIS — N3281 Overactive bladder: Secondary | ICD-10-CM

## 2022-08-23 DIAGNOSIS — Z01419 Encounter for gynecological examination (general) (routine) without abnormal findings: Secondary | ICD-10-CM | POA: Diagnosis not present

## 2022-08-23 MED ORDER — ESTRADIOL 0.5 MG PO TABS: 0.5000 mg | ORAL_TABLET | Freq: Every day | ORAL | 3 refills | Status: DC

## 2022-10-05 ENCOUNTER — Encounter: Payer: Self-pay | Admitting: Obstetrics and Gynecology

## 2022-10-05 ENCOUNTER — Ambulatory Visit (INDEPENDENT_AMBULATORY_CARE_PROVIDER_SITE_OTHER): Payer: BC Managed Care – PPO | Admitting: Obstetrics and Gynecology

## 2022-10-05 VITALS — BP 108/69 | HR 67 | Resp 16 | Ht 64.0 in | Wt 127.6 lb

## 2022-10-05 DIAGNOSIS — N952 Postmenopausal atrophic vaginitis: Secondary | ICD-10-CM | POA: Diagnosis not present

## 2022-10-05 DIAGNOSIS — N941 Unspecified dyspareunia: Secondary | ICD-10-CM

## 2022-10-05 DIAGNOSIS — F411 Generalized anxiety disorder: Secondary | ICD-10-CM

## 2022-10-05 DIAGNOSIS — R5383 Other fatigue: Secondary | ICD-10-CM

## 2022-10-05 DIAGNOSIS — N951 Menopausal and female climacteric states: Secondary | ICD-10-CM

## 2022-10-05 MED ORDER — ESTRADIOL 1 MG PO TABS
1.0000 mg | ORAL_TABLET | Freq: Every day | ORAL | 3 refills | Status: DC
Start: 2022-10-05 — End: 2023-10-16

## 2022-10-05 NOTE — Addendum Note (Signed)
Addended by: Fabian November on: 10/05/2022 04:26 PM   Modules accepted: Orders

## 2022-10-05 NOTE — Progress Notes (Signed)
    GYNECOLOGY PROGRESS NOTE  Subjective:    Patient ID: Tami Williams, female    DOB: 23-Apr-1966, 57 y.o.   MRN: [id number]  HPI  Patient is a 57 y.o. [id number] female who presents for 55 week f/u of HRT for postmenopausal symptoms (vaginal atrophy, dyspareunia, and hot flushes). Was initiated on Estrace 0.5 mg daily. Notes that overall she has noted a significant improvement in her symptoms of hot flushes and mood irritability. Also reports that intercourse is less painful.  Still noting some night sweats, wonders if she needs to increase the dosage.   Patient also currently taking Paxil 10 mg for her anxiety symptoms, has been on this medication for several years. Previously discussed option of weaning if believed that her symptoms stemmed more from hormonal imbalance.  Patient notes that she discussed with her PCP who would recommend for her to remain on medication for now.   Lastly reports issues of fatigue.  Is unsure if this is a part of menopause, or something different.  Has had blood work in the past few months for routine preventative maintenance. Also has ha h/o thyroid disease, but notes last labs were within normal range.   The following portions of the patient's history were reviewed and updated as appropriate: allergies, current medications, past family history, past medical history, past social history, past surgical history, and problem list.  Review of Systems Pertinent items noted in HPI and remainder of comprehensive ROS otherwise negative.   Objective:   Blood pressure 108/69, pulse 67, resp. rate 16, height 5\' 4"  (1.626 m), weight 127 lb 9.6 oz (57.9 kg). Body mass index is 21.9 kg/m. General appearance: alert and no distress Remainder of exam deferred.   Assessment:   1. Vaginal atrophy   2. Dyspareunia in female   3. Hot flashes due to menopause   4. GAD (generalized anxiety disorder)   5. Fatigue, unspecified type      Plan:   Overall menopausal symptoms  have improved with use of oral HRT. Discussed increasing dose to help better manage night sweats. Will send new prescription for 1 mg dosing.  GAD symptoms currently managed on Paxil, advised patient that she can remain on medication if she feels it is helpful. Otherwise, if she desires to see if symptoms were more hormonal induced, can titrate dose down (half a pill or every other day dosing for 2 weeks).  If symptoms remain controlled, can d/c, otherwise, should remain on medication.  Fatigue, unclear cause.  Discussed option of vitamin assessment as all other recent labs were normal. Patient notes she will wait to see if her symptoms improve after increasing dose of Estrace. If no improvement, will move forward with lab assessment.     A total of 25 minutes were spent face-to-face with the patient during this encounter and over half of that time involved counseling and coordination of care.  Hildred Laser, MD [facility]

## 2022-10-21 NOTE — H&P (Signed)
 PRE-PROCEDURE HISTORY AND PHYSICAL EXAM  Tami Williams presents for her scheduled UGI ENDOSCOPY; WITH BIOPSY, SINGLE OR MULTIPLE.   The indication for the procedure(s) is Gastroesophageal reflux disease without esophagitis [K21.9].    There have been no significant recent changes in the patient's medical status.  No past medical history on file. No past surgical history on file.  Allergies No Known Allergies  Medications PARoxetine , clonazePAM, diclofenac, flu vac qs 2020(4 yr up)CD(PF), fluticasone  propionate, hydrOXYzine , lactulose , levothyroxine , linaclotide, omeprazole , ondansetron , traZODone , and valACYclovir   Physical Examination There were no vitals filed for this visit. There is no height or weight on file to calculate BMI.  Mental Status: AAOx3, thoughts organized   Lungs: Clear to auscultation, unlabored breathing   Heart: Regular rate and rhythm, normal S1 and S2, no murmur   Abdomen: Soft, non-tender, non-distended     ASSESSMENT AND PLAN Tami Williams has been evaluated and deemed appropriate to undergo the planned UGI ENDOSCOPY; WITH BIOPSY, SINGLE OR MULTIPLE.  The patient, or her authorized representative, was provided an explanation of the nature and benefits of the procedure(s), the most frequent risks, and alternatives, if any.  I personally reviewed this information with the patient, or her authorized representative, and answered all questions.

## 2023-01-03 ENCOUNTER — Ambulatory Visit: Payer: BC Managed Care – PPO | Admitting: Obstetrics and Gynecology

## 2023-01-12 ENCOUNTER — Ambulatory Visit: Payer: BC Managed Care – PPO | Admitting: Obstetrics and Gynecology

## 2023-01-12 NOTE — Progress Notes (Deleted)
GYNECOLOGY PROGRESS NOTE  Subjective:    Patient ID: Tami Williams, female    DOB: 06/26/1965, 57 y.o.   MRN: [id number]  HPI  Patient is a 57 y.o. [id number] female who presents for having menospuase symptoms such as, for 2 months   The following portions of the patient's history were reviewed and updated as appropriate: allergies, current medications, past family history, past medical history, past social history, past surgical history, and problem list.  Review of Systems Pertinent items are noted in HPI.   Objective:   There were no vitals taken for this visit. There is no height or weight on file to calculate BMI. General appearance: alert, cooperative, and no distress Abdomen: soft, non-tender; bowel sounds normal; no masses,  no organomegaly Pelvic: cervix normal in appearance, external genitalia normal, no adnexal masses or tenderness, no cervical motion tenderness, rectovaginal septum normal, uterus normal size, shape, and consistency, and vagina normal without discharge Extremities: extremities normal, atraumatic, no cyanosis or edema Neurologic: Grossly normal   Assessment:   No diagnosis found.   Plan:   There are no diagnoses linked to this encounter.

## 2023-05-17 HISTORY — PX: LIPOSUCTION: SHX10

## 2023-06-05 ENCOUNTER — Ambulatory Visit
Admission: EM | Admit: 2023-06-05 | Discharge: 2023-06-05 | Disposition: A | Discharge status: Home / Self Care | Payer: 59 | Attending: Family Medicine | Admitting: Family Medicine

## 2023-06-05 DIAGNOSIS — H1033 Unspecified acute conjunctivitis, bilateral: Secondary | ICD-10-CM | POA: Diagnosis not present

## 2023-06-05 DIAGNOSIS — I10 Essential (primary) hypertension: Secondary | ICD-10-CM

## 2023-06-05 DIAGNOSIS — H01119 Allergic dermatitis of unspecified eye, unspecified eyelid: Secondary | ICD-10-CM

## 2023-06-05 MED ORDER — HYDROCORTISONE 2.5 % EX OINT: TOPICAL_OINTMENT | Freq: Two times a day (BID) | CUTANEOUS | 0 refills | Status: AC

## 2023-06-05 MED ORDER — ERYTHROMYCIN 5 MG/GM OP OINT: TOPICAL_OINTMENT | OPHTHALMIC | 0 refills | Status: DC

## 2023-06-05 NOTE — ED Triage Notes (Signed)
Pt c/o bilateral eye swelling & redness d/t lash glue x1 day. Has tried zyrtec w/o relief.

## 2023-06-05 NOTE — ED Provider Notes (Signed)
[facility]    CSN: [id number] Arrival date & time: 06/05/23  1154      History   Chief Complaint Chief Complaint  Patient presents with   Allergic Reaction   Facial Swelling    HPI Tami Williams is a 58 y.o. female.   HPI   Tami Williams presents for possible allergic reaction to the eye lash glue. She went to a new place and had eyelashes done. This morning her eyes were closed shu. They are very sensitive to light. The right eye was watering really bad.  Look at the computer made her eyes hurt. Took Zyrtec for the past 3 days.  Her eyes are itching bad and feel dry.  No blurry vision.  She has been wearing her glasses instead of her contacts.  Facial swelling: eyelid swelling  Throat swelling: no Abdominal pain: no  Nausea/Vomiting: no Palpitations: no Chest pain/discomfort: no Rash: yes Cyanosis: no Headache: no  New food/drink: no New soaps: no  New lotions: no New detergents:  New medications or supplements: no         Past Medical History:  Diagnosis Date   Acute epigastric pain    GAD (generalized anxiety disorder)    GERD (gastroesophageal reflux disease)    Hypertension    Non-cardiac chest pain     Patient Active Problem List   Diagnosis Date Noted   Dyspareunia in female 04/07/2021   Chronic idiopathic constipation 02/23/2021   Nausea 02/23/2021   Perimenopausal symptoms 09/13/2016   Hypertension 03/20/2015   GAD (generalized anxiety disorder) 03/20/2015    Past Surgical History:  Procedure Laterality Date   65 HOUR PH STUDY N/A 01/06/2016   Procedure: 24 HOUR PH STUDY;  Surgeon: Midge Minium, MD;  Location: [facility] ENDOSCOPY;  Service: Endoscopy;  Laterality: N/A;   ABDOMINAL HYSTERECTOMY     due to endometriosis   ESOPHAGEAL MANOMETRY N/A 01/06/2016   Procedure: ESOPHAGEAL MANOMETRY (EM);  Surgeon: Midge Minium, MD;  Location: [facility] ENDOSCOPY;  Service: Endoscopy;  Laterality: N/A;   ESOPHAGOGASTRODUODENOSCOPY      ESOPHAGOGASTRODUODENOSCOPY (EGD) WITH PROPOFOL N/A 12/02/2015   Procedure: ESOPHAGOGASTRODUODENOSCOPY (EGD) WITH PROPOFOL;  Surgeon: Scot Jun, MD;  Location: [facility] ENDOSCOPY;  Service: Endoscopy;  Laterality: N/A;   FOOT SURGERY Left 04/2018   NISSEN FUNDOPLICATION  2007   dr. Katrinka Blazing    OB History     Gravida  3   Para  3   Term  2   Preterm  1   AB      Living  3      SAB      IAB      Ectopic      Multiple      Live Births  3            Home Medications    Prior to Admission medications   Medication Sig Start Date End Date Taking? Authorizing Provider  EPINEPHrine 0.3 mg/0.3 mL IJ SOAJ injection Inject 0.3 mLs into the muscle as needed. 03/31/23 03/30/24 Yes [provider]  erythromycin ophthalmic ointment Place a 1/2 inch ribbon of ointment into the lower eyelid. Use 3 times a day for 7 days. 06/05/23  Yes Marco Adelson, DO  estradiol (ESTRACE) 1 MG tablet Take 1 tablet (1 mg total) by mouth daily. 10/05/22  Yes Hildred Laser, MD  fluticasone (FLONASE) 50 MCG/ACT nasal spray Place 2 sprays into both nostrils daily. 01/05/21  Yes Bacigalupo, Marzella Schlein, MD  hydrocortisone 2.5 %  ointment Apply topically 2 (two) times daily. Do not use for more than 1-2 weeks at a time. 06/05/23  Yes Ha Shannahan, DO  LINZESS 145 MCG CAPS capsule Take 145 mcg by mouth daily.   Yes [provider]  ondansetron (ZOFRAN) 4 MG tablet Take 1 tablet (4 mg total) by mouth every 8 (eight) hours as needed. 02/23/21  Yes Jacky Kindle, FNP  pantoprazole (PROTONIX) 40 MG tablet Take 40 mg by mouth 2 (two) times daily before a meal. 02/03/23  Yes [provider]  PARoxetine (PAXIL) 10 MG tablet Take 1 tablet (10 mg total) by mouth daily. 01/05/21  Yes Bacigalupo, Marzella Schlein, MD  SYNTHROID 50 MCG tablet TK 1 T PO QD 08/03/15  Yes [provider]  traZODone (DESYREL) 50 MG tablet Take 1-2 tablets (50-100 mg total) by mouth at bedtime as needed for sleep.  01/05/21  Yes Bacigalupo, Marzella Schlein, MD    Family History Family History  Problem Relation Age of Onset   Hypertension Mother    Heart failure Mother        Defib & pacemaker implant    Heart disease Mother    Diabetes Mother    Hypertension Father    Healthy Brother    Healthy Brother    Breast cancer Neg Hx     Social History Social History   Tobacco Use   Smoking status: Never   Smokeless tobacco: Never  Vaping Use   Vaping status: Never Used  Substance Use Topics   Alcohol use: Yes    Alcohol/week: 0.0 standard drinks of alcohol    Comment: occasional   Drug use: No     Allergies   Wasp venom protein and No known allergies   Review of Systems Review of Systems: negative unless otherwise stated in HPI.      Physical Exam Triage Vital Signs ED Triage Vitals  Encounter Vitals Group     BP 06/05/23 1318 (!) 155/76     Systolic BP Percentile --      Diastolic BP Percentile --      Pulse Rate 06/05/23 1318 (!) 50     Resp 06/05/23 1318 16     Temp 06/05/23 1318 97.8 F (36.6 C)     Temp Source 06/05/23 1318 Oral     SpO2 06/05/23 1318 97 %     Weight 06/05/23 1316 129 lb (58.5 kg)     Height 06/05/23 1316 5\' 4"  (1.626 m)     Head Circumference --      Peak Flow --      Pain Score 06/05/23 1322 10     Pain Loc --      Pain Education --      Exclude from Growth Chart --    No data found.  Updated Vital Signs BP (!) 155/76 (BP Location: Right Arm)   Pulse (!) 50   Temp 97.8 F (36.6 C) (Oral)   Resp 16   Ht 5\' 4"  (1.626 m)   Wt 58.5 kg   SpO2 97%   BMI 22.14 kg/m   Visual Acuity Right Eye Distance:   Left Eye Distance:   Bilateral Distance:    Right Eye Near:   Left Eye Near:    Bilateral Near:     Physical Exam GEN:     alert, well appearing femaleand no distress    HENT:  mucus membranes moist, oropharyngeal without lesions or erythema,  nares patent, no nasal discharge, no  uvula or oral swelling, lips of normal size, no significant  facial asymmetry  EYES:   pupils equal and reactive, EOM intact and without pain with movement, no lid edema but the lids are erythematous, dried crusty discharge along both lateral lid margins RESP:  clear to auscultation bilaterally, no increased work of breathing  CVS:   regular rhythm, bradycardic   Skin:   warm and dry, erythematous patches lateral to bilateral upper eyelids     UC Treatments / Results  Labs (all labs ordered are listed, but only abnormal results are displayed) Labs Reviewed - No data to display  EKG   Radiology No results found.  Procedures Procedures (including critical care time)  Medications Ordered in UC Medications - No data to display  Initial Impression / Assessment and Plan / UC Course  I have reviewed the triage vital signs and the nursing notes.  Pertinent labs & imaging results that were available during my care of the patient were reviewed by me and considered in my medical decision making (see chart for details).       Patient is a 58 y.o. female  who presents for p;possible allergic reaction to eye lash glue.  Overall patient is nontoxic-appearing and afebrile.  Tami Williams has has hives but otherwise exam is unremarkable. There has been no oral swelling, nausea, vomiting, palpitation, cyanosis, tachycardia or headache to suggest anaphylaxis.  She reports history of lid edema and erythema.  She has evidence of discharge and erythema with rash around the upper lids.  Treat with hydrocortisone 2.5% ointment for possible contact versus allergic dermatitis.  Erythromycin ointment provided as she has some light sensitivity and conjunctivitis.  Continue wearing her glasses.  Follow-up with her eye care provider if not improving.  Tami Williams is hypertensive here.  BP 155/76 then after sitting was 140/92. Previous blood pressures from chart review were normal.  She was previously taken off antihypertensive medications due to hypotension.Recommended she check  herblood pressure and follow up with her primary care provider in the next 2 weeks.    ED and return precautions given and patient/guardian voiced understanding. Discussed MDM, treatment plan and plan for follow-up with patient who agrees with plan.    Final Clinical Impressions(s) / UC Diagnoses   Final diagnoses:  Eyelid dermatitis, allergic/contact, unspecified laterality  Elevated blood pressure reading with diagnosis of hypertension  Acute conjunctivitis of both eyes, unspecified acute conjunctivitis type     Discharge Instructions      Stop by the pharmacy to pick up your antibiotic eye medication.  Follow up with your primary eyecare provider or [facility] if symptoms suddenly worsen or you have little improvement in your eye symptoms.   Monitor blood pressure at home.  Follow-up with your primary care provider in the next 2 to 3 weeks if your blood pressure remains greater than 140/90.      ED Prescriptions     Medication Sig Dispense Auth. Provider   hydrocortisone 2.5 % ointment Apply topically 2 (two) times daily. Do not use for more than 1-2 weeks at a time. 30 g Raden Byington, DO   erythromycin ophthalmic ointment Place a 1/2 inch ribbon of ointment into the lower eyelid. Use 3 times a day for 7 days. 3.5 g Katha Cabal, DO      PDMP not reviewed this encounter.   Katha Cabal, DO 06/05/23 1744

## 2023-06-05 NOTE — Discharge Instructions (Addendum)
Stop by the pharmacy to pick up your antibiotic eye medication.  Follow up with your primary eyecare provider or [facility] if symptoms suddenly worsen or you have little improvement in your eye symptoms.   Monitor blood pressure at home.  Follow-up with your primary care provider in the next 2 to 3 weeks if your blood pressure remains greater than 140/90.

## 2023-07-20 ENCOUNTER — Ambulatory Visit
Admission: RE | Admit: 2023-07-20 | Discharge: 2023-07-20 | Disposition: A | Discharge status: Home / Self Care | Payer: BC Managed Care – PPO | Source: Ambulatory Visit | Attending: Obstetrics and Gynecology | Admitting: Obstetrics and Gynecology

## 2023-07-20 DIAGNOSIS — Z1231 Encounter for screening mammogram for malignant neoplasm of breast: Secondary | ICD-10-CM | POA: Diagnosis present

## 2023-07-20 DIAGNOSIS — Z01419 Encounter for gynecological examination (general) (routine) without abnormal findings: Secondary | ICD-10-CM | POA: Insufficient documentation

## 2023-07-27 ENCOUNTER — Encounter: Payer: Self-pay | Admitting: Obstetrics and Gynecology

## 2023-09-09 ENCOUNTER — Emergency Department
Admission: EM | Admit: 2023-09-09 | Discharge: 2023-09-09 | Disposition: A | Discharge status: Home / Self Care | Attending: Emergency Medicine | Admitting: Emergency Medicine

## 2023-09-09 ENCOUNTER — Emergency Department

## 2023-09-09 ENCOUNTER — Other Ambulatory Visit: Payer: Self-pay

## 2023-09-09 DIAGNOSIS — I10 Essential (primary) hypertension: Secondary | ICD-10-CM | POA: Insufficient documentation

## 2023-09-09 DIAGNOSIS — K5900 Constipation, unspecified: Secondary | ICD-10-CM | POA: Insufficient documentation

## 2023-09-09 DIAGNOSIS — R1031 Right lower quadrant pain: Secondary | ICD-10-CM

## 2023-09-09 DIAGNOSIS — R1084 Generalized abdominal pain: Secondary | ICD-10-CM | POA: Diagnosis present

## 2023-09-09 LAB — CBC
HCT: 40.2 % (ref 36.0–46.0)
Hemoglobin: 13.6 g/dL (ref 12.0–15.0)
MCH: 30.6 pg (ref 26.0–34.0)
MCHC: 33.8 g/dL (ref 30.0–36.0)
MCV: 90.3 fL (ref 80.0–100.0)
Platelets: 297 10*3/uL (ref 150–400)
RBC: 4.45 MIL/uL (ref 3.87–5.11)
RDW: 12.5 % (ref 11.5–15.5)
WBC: 6.4 10*3/uL (ref 4.0–10.5)
nRBC: 0 % (ref 0.0–0.2)

## 2023-09-09 LAB — COMPREHENSIVE METABOLIC PANEL WITH GFR
ALT: 28 U/L (ref 0–44)
AST: 30 U/L (ref 15–41)
Albumin: 4.1 g/dL (ref 3.5–5.0)
Alkaline Phosphatase: 91 U/L (ref 38–126)
Anion gap: 7 (ref 5–15)
BUN: 16 mg/dL (ref 6–20)
CO2: 27 mmol/L (ref 22–32)
Calcium: 9.4 mg/dL (ref 8.9–10.3)
Chloride: 104 mmol/L (ref 98–111)
Creatinine, Ser: 0.95 mg/dL (ref 0.44–1.00)
GFR, Estimated: >60 mL/min (ref >60)
Glucose, Bld: 101 mg/dL — ABNORMAL HIGH (ref 70–99)
Potassium: 4.1 mmol/L (ref 3.5–5.1)
Sodium: 138 mmol/L (ref 135–145)
Total Bilirubin: 0.3 mg/dL (ref 0.0–1.2)
Total Protein: 7.2 g/dL (ref 6.5–8.1)

## 2023-09-09 LAB — URINALYSIS, ROUTINE W REFLEX MICROSCOPIC
Bilirubin Urine: NEGATIVE
Glucose, UA: NEGATIVE mg/dL
Hgb urine dipstick: NEGATIVE
Ketones, ur: NEGATIVE mg/dL
Leukocytes,Ua: NEGATIVE
Nitrite: NEGATIVE
Protein, ur: NEGATIVE mg/dL
Specific Gravity, Urine: >1.046 — ABNORMAL HIGH (ref 1.005–1.030)
pH: 5 (ref 5.0–8.0)

## 2023-09-09 LAB — LIPASE, BLOOD: Lipase: 38 U/L (ref 11–51)

## 2023-09-09 MED ORDER — ONDANSETRON HCL 4 MG/2ML IJ SOLN
4.0000 mg | Freq: Once | INTRAMUSCULAR | Status: AC
  Administered 2023-09-09: 4 mg via INTRAVENOUS
  Filled 2023-09-09: qty 2

## 2023-09-09 MED ORDER — MORPHINE SULFATE (PF) 4 MG/ML IV SOLN
4.0000 mg | Freq: Once | INTRAVENOUS | Status: AC
  Administered 2023-09-09: 4 mg via INTRAVENOUS
  Filled 2023-09-09: qty 1

## 2023-09-09 MED ORDER — IBUPROFEN 600 MG PO TABS: 600.0000 mg | ORAL_TABLET | Freq: Three times a day (TID) | ORAL | 0 refills | Status: AC | PRN

## 2023-09-09 MED ORDER — HYDROMORPHONE HCL 1 MG/ML IJ SOLN
0.5000 mg | Freq: Once | INTRAMUSCULAR | Status: AC
  Administered 2023-09-09: 0.5 mg via INTRAVENOUS
  Filled 2023-09-09: qty 0.5

## 2023-09-09 MED ORDER — IOHEXOL 300 MG/ML  SOLN
100.0000 mL | Freq: Once | INTRAMUSCULAR | Status: AC | PRN
  Administered 2023-09-09: 100 mL via INTRAVENOUS

## 2023-09-09 MED ORDER — ONDANSETRON HCL 4 MG/2ML IJ SOLN
4.0000 mg | Freq: Once | INTRAMUSCULAR | Status: AC
Start: 2023-09-09 — End: 2023-09-09
  Administered 2023-09-09: 4 mg via INTRAVENOUS
  Filled 2023-09-09: qty 2

## 2023-09-09 NOTE — ED Provider Notes (Signed)
 [facility] Provider Note    Event Date/Time   First MD Initiated Contact with Patient 09/09/23 1746     (approximate)   History   Chief Complaint Abdominal Pain   HPI  Tami Williams is a 58 y.o. female with past medical history of hypertension, anxiety, and GERD who presents to the ED complaining of abdominal pain.  Patient reports that she had acute onset of severe pain in the right lower quadrant of her abdomen earlier today.  Pain has been associated with nausea, but patient states that she cannot vomit due to previous Nissen fundoplication.  She denies any changes in her bowel movements and she has not had any fever, dysuria, or flank pain.  She denies any history of similar symptoms, has never had a kidney stone.      Physical Exam   Triage Vital Signs: ED Triage Vitals [09/09/23 1645]  Encounter Vitals Group     BP (!) 180/90     Systolic BP Percentile      Diastolic BP Percentile      Pulse Rate 69     Resp 14     Temp 98.2 F (36.8 C)     Temp Source Oral     SpO2 100 %     Weight      Height 5\' 4"  (1.626 m)     Head Circumference      Peak Flow      Pain Score 10     Pain Loc      Pain Education      Exclude from Growth Chart     Most recent vital signs: Vitals:   09/09/23 1645  BP: (!) 180/90  Pulse: 69  Resp: 14  Temp: 98.2 F (36.8 C)  SpO2: 100%    Constitutional: Alert and oriented. Eyes: Conjunctivae are normal. Head: Atraumatic. Nose: No congestion/rhinnorhea. Mouth/Throat: Mucous membranes are moist.  Cardiovascular: Normal rate, regular rhythm. Grossly normal heart sounds.  2+ radial pulses bilaterally. Respiratory: Normal respiratory effort.  No retractions. Lungs CTAB. Gastrointestinal: Soft and diffusely tender to palpation, greatest in the right lower quadrant with voluntary guarding.  No CVA tenderness bilaterally.  No distention. Musculoskeletal: No lower extremity tenderness nor edema.  Neurologic:   Normal speech and language. No gross focal neurologic deficits are appreciated.    ED Results / Procedures / Treatments   Labs (all labs ordered are listed, but only abnormal results are displayed) Labs Reviewed  COMPREHENSIVE METABOLIC PANEL WITH GFR - Abnormal; Notable for the following components:      Result Value   Glucose, Bld 101 (*)    All other components within normal limits  URINALYSIS, ROUTINE W REFLEX MICROSCOPIC - Abnormal; Notable for the following components:   Color, Urine YELLOW (*)    APPearance CLEAR (*)    Specific Gravity, Urine >1.046 (*)    All other components within normal limits  LIPASE, BLOOD  CBC    PROCEDURES:  Critical Care performed: No  Procedures   MEDICATIONS ORDERED IN ED: Medications  HYDROmorphone  (DILAUDID ) injection 0.5 mg (has no administration in time range)  ondansetron  (ZOFRAN ) injection 4 mg (has no administration in time range)  morphine  (PF) 4 MG/ML injection 4 mg (4 mg Intravenous Given 09/09/23 1804)  ondansetron  (ZOFRAN ) injection 4 mg (4 mg Intravenous Given 09/09/23 1804)  iohexol  (OMNIPAQUE ) 300 MG/ML solution 100 mL (100 mLs Intravenous Contrast Given 09/09/23 1834)     IMPRESSION / MDM / ASSESSMENT  AND PLAN / ED COURSE  I reviewed the triage vital signs and the nursing notes.                              58 y.o. female with past medical history of hypertension, GERD, and anxiety who presents to the ED complaining of acute onset severe abdominal pain in the right lower quadrant.  Patient's presentation is most consistent with acute presentation with potential threat to life or bodily function.  Differential diagnosis includes, but is not limited to, appendicitis, bowel perforation, kidney stone, UTI, colitis, diverticulitis, ovarian cyst.  Patient uncomfortable appearing but in no acute distress, vital signs are unremarkable.  Her abdomen is soft but she does have diffuse tenderness to palpation with greatest  tenderness in the right lower quadrant.  Labs without significant anemia, leukocytosis, electrolyte abnormality, or AKI.  LFTs and lipase are unremarkable, urinalysis pending at this time.  We will treat symptomatically with IV morphine  and Zofran , further assessed with CT imaging of her abdomen/pelvis.  CT abdomen/pelvis results are pending at this time, patient does report ongoing pain and we will treat with IV Dilaudid  and Zofran .  Patient turned over to oncoming provider pending CT results.      FINAL CLINICAL IMPRESSION(S) / ED DIAGNOSES   Final diagnoses:  RLQ abdominal pain     Rx / DC Orders   ED Discharge Orders     None        Note:  This document was prepared using Dragon voice recognition software and may include unintentional dictation errors.   Twilla Galea, MD 09/09/23 [place]

## 2023-09-09 NOTE — ED Triage Notes (Addendum)
 Pt to ed from home via POV for RLQ abd pain with nausea that started today sudden onset. Pt is caox4, in no acute distress and ambulatory in triage. Pt has HX of reflux but unable to vomit due to a procedure to treat her reflx that prohibits her from vomiting. Pt denies any burning during urination but does admit to pain when trying to urinate.

## 2023-09-09 NOTE — Discharge Instructions (Addendum)
 You have been diagnosed with right lower quadrant abdominal pain, constipation.  Please take ibuprofen every 8 hours after main meals for pain.  Please take MiraLAX , mix 2 scoops of powder in a glass of water and take 3 times per day until you can pass stools.  Then you can take MiraLAX  mixing 1 scoop in 1 glass of water daily.  Please drink plenty fluids.  Please call GI to make an appointment for follow-up.

## 2023-09-09 NOTE — ED Provider Notes (Signed)
 ----------------------------------------- 8:41 PM on 09/09/2023 -----------------------------------------  Blood pressure (!) 149/92, pulse 76, temperature 98.1 F (36.7 C), temperature source Oral, resp. rate 16, height 5\' 4"  (1.626 m), weight 54.4 kg, SpO2 96%.  Assuming care from Dr. Cleora Daft , In short, Tami Williams is a 58 y.o. female with a chief complaint of Abdominal Pain .  Refer to the original H&P for additional details.  The current plan of care is to wait for CT report to determine mine disposition.  ____________________________________________    ED Results / Procedures / Treatments   Labs (all labs ordered are listed, but only abnormal results are displayed) Labs Reviewed  COMPREHENSIVE METABOLIC PANEL WITH GFR - Abnormal; Notable for the following components:      Result Value   Glucose, Bld 101 (*)    All other components within normal limits  URINALYSIS, ROUTINE W REFLEX MICROSCOPIC - Abnormal; Notable for the following components:   Color, Urine YELLOW (*)    APPearance CLEAR (*)    Specific Gravity, Urine >1.046 (*)    All other components within normal limits  LIPASE, BLOOD  CBC     EKG     RADIOLOGY I personally viewed and evaluated these images as part of my medical decision making, as well as reviewing the written report by the radiologist.  ED Provider Interpretation:   CT ABDOMEN PELVIS W CONTRAST Result Date: 09/09/2023 CLINICAL DATA:  Right lower quadrant abdominal pain EXAM: CT ABDOMEN AND PELVIS WITH CONTRAST TECHNIQUE: Multidetector CT imaging of the abdomen and pelvis was performed using the standard protocol following bolus administration of intravenous contrast. RADIATION DOSE REDUCTION: This exam was performed according to the departmental dose-optimization program which includes automated exposure control, adjustment of the mA and/or kV according to patient size and/or use of iterative reconstruction technique. CONTRAST:   OMNIPAQUE  IOHEXOL  300 MG/ML  SOLN COMPARISON:  06/28/2019 FINDINGS: Lower chest: No acute pleural or parenchymal lung disease. Hepatobiliary: No focal liver abnormality is seen. Status post cholecystectomy. No biliary dilatation. Pancreas: Unremarkable. No pancreatic ductal dilatation or surrounding inflammatory changes. Spleen: Normal in size without focal abnormality. Adrenals/Urinary Tract: Adrenal glands are unremarkable. Kidneys are normal, without renal calculi, focal lesion, or hydronephrosis. Bladder is decompressed, limiting its evaluation. Stomach/Bowel: No bowel obstruction or ileus. Moderate retained stool throughout the colon compatible with constipation. The appendix is not well visualized. No bowel wall thickening or inflammatory change. Postsurgical changes are noted from prior Nissen fundoplication. Vascular/Lymphatic: Aortic atherosclerosis. No enlarged abdominal or pelvic lymph nodes. Reproductive: Status post hysterectomy. No adnexal masses. Other: No free fluid or free intraperitoneal gas. No abdominal wall hernia. Musculoskeletal: No acute or destructive bony abnormalities. Reconstructed images demonstrate no additional findings. IMPRESSION: 1. Moderate retained stool throughout the colon consistent with constipation. No bowel obstruction or ileus. 2. Otherwise no acute intra-abdominal or intrapelvic process. The appendix is not well visualized, but there are no inflammatory changes in the right lower quadrant to suggest appendicitis. 3.  Aortic Atherosclerosis (ICD10-I70.0). Electronically Signed   By: Bobbye Burrow M.D.   On: 09/09/2023 20:15     PROCEDURES:  Critical Care performed: No  Procedures   MEDICATIONS ORDERED IN ED: Medications  morphine  (PF) 4 MG/ML injection 4 mg (4 mg Intravenous Given 09/09/23 1804)  ondansetron  (ZOFRAN ) injection 4 mg (4 mg Intravenous Given 09/09/23 1804)  iohexol  (OMNIPAQUE ) 300 MG/ML solution 100 mL (100 mLs Intravenous Contrast Given 09/09/23  1834)  HYDROmorphone  (DILAUDID ) injection 0.5 mg (0.5 mg Intravenous Given 09/09/23 2005)  ondansetron  (ZOFRAN ) injection 4 mg (4 mg Intravenous Given 09/09/23 2005)     IMPRESSION / MDM / ASSESSMENT AND PLAN / ED COURSE  I reviewed the triage vital signs and the nursing notes.                              Differential diagnosis includes, but is not limited to, constipation, appendicitis, ovarian torsion  Patient's presentation is most consistent with acute complicated illness / injury requiring diagnostic workup.  Patient's diagnosis is consistent with constipation. Patient will be discharged home without prescriptions.  Recommended patient take  MiraLAX .  Patient will have high-fiber diet to follow to prevent constipation.  Patient is to follow up with PCP as needed or otherwise directed.  Referred patient to be seen by GI. Patient is given ED precautions to return to the ED for any worsening or new symptoms.  Clinical Course as of 09/09/23 1610  Sat Sep 09, 2023  2041 CT ABDOMEN PELVIS W CONTRAST  Moderate retained stool throughout the colon consistent with constipation. No bowel obstruction or ileus. 2. Otherwise no acute intra-abdominal or intrapelvic process. The appendix is not well visualized, but there are no inflammatory changes in the right lower quadrant to suggest appendicitis. 3.  Aortic Atherosclerosis (ICD10-I70.0).   [AE]  2052 Updated patient with results of the CT abdomen and pelvis.  Patient states hydromorphone  relieves the pain but she is feeling dizzy.  Explained patient that is a side effect of the medication.  I advised patient to follow-up with GI.  Patient desires to be seen in [facility].  Patient will be discharged with MiraLAX .  Follow-up with PCP [AE]    Clinical Course User Index [AE] Awilda Lennox, PA-C    FINAL CLINICAL IMPRESSION(S) / ED DIAGNOSES   Final diagnoses:  RLQ abdominal pain  Constipation, unspecified constipation type     Rx / DC  Orders   ED Discharge Orders          Ordered    ibuprofen (ADVIL) 600 MG tablet  Every 8 hours PRN        09/09/23 2058             Note:  This document was prepared using Dragon voice recognition software and may include unintentional dictation errors.    Awilda Lennox, PA-C 09/09/23 2059    Bradler, Evan K, MD 09/09/23 2206

## 2023-09-09 NOTE — ED Notes (Signed)
 Patient to restroom.

## 2023-10-16 MED ORDER — ESTRADIOL 1 MG PO TABS
1.0000 mg | ORAL_TABLET | Freq: Every day | ORAL | 0 refills | Status: DC
Start: 2023-10-16 — End: 2023-11-10

## 2023-10-16 NOTE — Telephone Encounter (Addendum)
 Spoke with patient. She has not read my chart message. Advised of need for annual. Transferred to front desk for scheduling. Patient scheduled for 11/07/23 with Anice Kerbs. Refill sent.

## 2023-10-16 NOTE — Telephone Encounter (Signed)
 Tami Williams

## 2023-10-16 NOTE — Addendum Note (Signed)
 Addended by: Arcelia Bean on: 10/16/2023 10:08 AM   Modules accepted: Orders

## 2023-10-17 NOTE — Progress Notes (Signed)
 Chief Complaint  Patient presents with  . Follow-up    HPI  Tami Williams is a 58 y.o. here for follow up of chronic medical issues  Hx of HTN: Home bp 120s/90s.  Not checking often enough to get global average.    Chronic constipation: Patient recently in the emergency room with recurrent problem.  Requesting refill of Linzess.  Requesting referral to local GI.  Primary insomnia: Patient have trouble falling and staying asleep.  She does not want to increase the trazodone  because of fear of being groggy the next day.  Hypothyroidism: No acute issues.  Tolerating med w/o adverse effects.  Denies any palpitations, wt changes, or temperature fluctuations.   GERD: Stable per patient on pantoprazole  without adverse effects.  ROS Review of systems is unremarkable for any active cardiac, respiratory, GI, GU, hematologic, neurologic, dermatologic, HEENT, or psychiatric symptoms except as noted above.  No fevers, chills, or constitutional symptoms.   Current Outpatient Medications  Medication Sig Dispense Refill  . EPINEPHrine (EPIPEN) 0.3 mg/0.3 mL auto-injector Inject 0.3 mLs (0.3 mg total) into the muscle as needed for Anaphylaxis 1 Pen 1  . estradioL  (ESTRACE ) 1 MG tablet Take 1 mg by mouth once daily    . fluticasone  (FLONASE ) 50 mcg/actuation nasal spray Place 2 sprays into both nostrils once daily as needed  11  . levothyroxine  (SYNTHROID ) 75 MCG tablet Take 1 tablet (75 mcg total) by mouth as directed (Saturday and Sunday) Take on an empty stomach with a glass of water at least 30-60 minutes before breakfast. 10 tablet 1  . levothyroxine  (SYNTHROID ) 88 MCG tablet Take 1 tablet (88 mcg total) by mouth as directed (Monday through Friday) Take on an empty stomach with a glass of water at least 30-60 minutes before breakfast. 60 tablet 1  . LINZESS 145 mcg capsule Take 1 capsule (145 mcg total) by mouth once daily 30 capsule 11  . ondansetron  (ZOFRAN -ODT) 4 MG disintegrating tablet  Take 1 tablet (4 mg total) by mouth every 8 (eight) hours as needed for Nausea 30 tablet 0  . pantoprazole  (PROTONIX ) 40 MG DR tablet TAKE 1 TABLET BY MOUTH TWICE DAILY BEFORE  MEALS 180 tablet 0  . traZODone  (DESYREL ) 50 MG tablet Take 1 tablet (50 mg total) by mouth at bedtime as needed 100 tablet 1  . mag carb/aluminum hydrox/algin (GAVISCON EXTRA STRENGTH ORAL) Take by mouth as needed    . scopolamine (TRANSDERM-SCOP) 1 mg over 3 days patch Place 1 patch (1 mg total) onto the skin every third day for 30 days 10 patch 0   No current facility-administered medications for this visit.    Allergies as of 10/17/2023 - Reviewed 10/17/2023  Allergen Reaction Noted  . Venom-wasp Swelling 12/29/2022    Patient Active Problem List  Diagnosis  . Gastro-esophageal reflux disease without esophagitis  . Allergic rhinitis  . Generalized anxiety disorder  . Diaphragmatic hernia without obstruction or gangrene  . Herpesviral vesicular dermatitis  . Essential (primary) hypertension (off meds due to hypotension)  . Other specified hypothyroidism  . Chronic sinusitis  . Acquired hypothyroidism, unspecified (TSH 0.8 - 08/11/23)  . Primary insomnia  . History of thyroid  nodule  . Chronic constipation - followed by GI    Past Medical History:  Diagnosis Date  . Acute epigastric pain 11/24/2015  . Anxiety   . GERD (gastroesophageal reflux disease)   . History of hiatal hernia   . Hypertension   . Non-cardiac chest pain 11/24/2015  .  Thyroid  disease     Past Surgical History:  Procedure Laterality Date  . EGD  08/08/2001   Successfully Dilated: No repeat per RTE  . Foot Surgery  Left 2017  . EGD  12/02/2015   No repeat per RTE  . CHOLECYSTECTOMY  2019  . HYSTERECTOMY VAGINAL    . LAPAROSCOPIC ESOPHAGOGASTRIC FUNDOPLASTY (NISSEN PROCEDURE)      Vitals:   10/17/23 1144 10/17/23 1151 10/17/23 1152  BP: (!) 130/96 (!) 138/98 (!) 150/98  Pulse: 65    SpO2: 99%    Weight: 58.2 kg (128 lb  6.4 oz)    Height: 162.6 cm (5' 4)    PainSc: 0-No pain     Body mass index is 22.04 kg/m.  Exam  General. Well appearing; NAD; VS reviewed     Eyes. Sclera and conjunctiva clear; Vision grossly intact; extraocular movements intact Neck. Supple.  Lungs. Respirations unlabored; clear to auscultation bilaterally Cardiovascular. Heart regular rate and rhythm without murmurs, gallops, or rubs Abdomen. Soft; non tender; non distended; no masses or organomegaly Extremities. no edema Skin. Normal color and turgor Neurologic. Alert and oriented x3; CN 2-12 grossly intact; no focal deficits  Assessment and Plan  1. Chronic constipation - followed by GI Unchanged.  We will take over Linzess refills.  Refer to local GI. -     Ambulatory Referral to Gastroenterology  2. Gastro-esophageal reflux disease without esophagitis Stable.  No changes to regimen.  Avoid triggers. -     Ambulatory Referral to Gastroenterology  3. Essential (primary) hypertension (off meds due to hypotension) Acutely elevated but better controlled at home.  Need more blood pressure readings before we add medication.  Patient to start checking twice a week and contact me within the next 4 weeks with an update.  4. Acquired hypothyroidism, unspecified (TSH 0.8 - 08/11/23) Stable.  No changes to regimen. -     Thyroid  Stimulating-Hormone (TSH); Future  5. Primary insomnia Not at goal.  Patient is going to try magnesium  glycinate 240 mg nightly as a supplement in addition to trazodone  as needed.  6. Routine general medical examination at a health care facility Labs prior to PE -     Comprehensive Metabolic Panel (CMP); Future -     Lipid Panel w/calc LDL; Future -     CBC w/auto Differential (5 Part); Future -     Hemoglobin A1C; Future  Other orders -     Follow up in Primary Care -     levothyroxine  (SYNTHROID ) 75 MCG tablet; Take 1 tablet (75 mcg total) by mouth as directed (Saturday and Sunday) Take on an  empty stomach with a glass of water at least 30-60 minutes before breakfast. -     EPINEPHrine (EPIPEN) 0.3 mg/0.3 mL auto-injector; Inject 0.3 mLs (0.3 mg total) into the muscle as needed for Anaphylaxis -     ondansetron  (ZOFRAN -ODT) 4 MG disintegrating tablet; Take 1 tablet (4 mg total) by mouth every 8 (eight) hours as needed for Nausea -     traZODone  (DESYREL ) 50 MG tablet; Take 1 tablet (50 mg total) by mouth at bedtime as needed -     scopolamine (TRANSDERM-SCOP) 1 mg over 3 days patch; Place 1 patch (1 mg total) onto the skin every third day for 30 days -     LINZESS 145 mcg capsule; Take 1 capsule (145 mcg total) by mouth once daily -     Follow up in Primary Care; Future  F/u 6 months  for PE.  Labs prior.  GI referral pending  ALDA CARPEN, MD

## 2023-11-07 ENCOUNTER — Ambulatory Visit: Admitting: Licensed Practical Nurse

## 2023-11-09 ENCOUNTER — Other Ambulatory Visit: Payer: Self-pay | Admitting: Licensed Practical Nurse

## 2023-11-21 ENCOUNTER — Encounter: Payer: Self-pay | Admitting: Licensed Practical Nurse

## 2023-11-21 ENCOUNTER — Other Ambulatory Visit (HOSPITAL_COMMUNITY)
Admission: RE | Admit: 2023-11-21 | Discharge: 2023-11-21 | Disposition: A | Discharge status: Home / Self Care | Source: Ambulatory Visit | Attending: Licensed Practical Nurse | Admitting: Licensed Practical Nurse

## 2023-11-21 ENCOUNTER — Ambulatory Visit: Admitting: Licensed Practical Nurse

## 2023-11-21 VITALS — BP 145/57 | HR 51 | Ht 64.0 in | Wt 128.6 lb

## 2023-11-21 DIAGNOSIS — N951 Menopausal and female climacteric states: Secondary | ICD-10-CM | POA: Diagnosis not present

## 2023-11-21 DIAGNOSIS — Z124 Encounter for screening for malignant neoplasm of cervix: Secondary | ICD-10-CM | POA: Insufficient documentation

## 2023-11-21 DIAGNOSIS — Z01419 Encounter for gynecological examination (general) (routine) without abnormal findings: Secondary | ICD-10-CM | POA: Diagnosis present

## 2023-11-21 DIAGNOSIS — Z1272 Encounter for screening for malignant neoplasm of vagina: Secondary | ICD-10-CM

## 2023-11-21 MED ORDER — ESTRADIOL 1 MG PO TABS: 1.0000 mg | ORAL_TABLET | Freq: Every day | ORAL | 11 refills | Status: DC

## 2023-11-21 NOTE — Progress Notes (Addendum)
 Alla Amis, MD   Chief Complaint  Patient presents with   Gynecologic Exam    HPI:      Tami Williams is a 58 y.o. [phone] whose LMP was No LMP recorded. Patient has had a hysterectomy., presents today for annual exam.  PCP- UTD, last seen 3 weeks ago, managing her HTN. Dentist- Has an appointment this year Eye Exam- Wears Contacts, had exam this past year.  Denies any new medical history Denies any recent surgeries Reports anxiety, has had to take medications in the past but no longer taking them. Says I dont like the way they make me feel and they effect my sex drive. Says she feels fine and stable. Denies panic attacks.  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  Lives with Husband, feels safe. Denies sexual or emotional abuse hx or current.  Denies smoking, vaping, tobacco use, drug use, ETOH.  Reports colonscopy in the last 10 years, no worrisome symptoms. Sees GI.  Denies any vaginal symptoms.  Denies any family history of breast and or ovarian cancer.        Patient Active Problem List   Diagnosis Date Noted   Dyspareunia in female 04/07/2021   Chronic idiopathic constipation 02/23/2021   Nausea 02/23/2021   Perimenopausal symptoms 09/13/2016   Hypertension 03/20/2015   GAD (generalized anxiety disorder) 03/20/2015    Past Surgical History:  Procedure Laterality Date   37 HOUR PH STUDY N/A 01/06/2016   Procedure: 24 HOUR PH STUDY;  Surgeon: Rogelia Copping, MD;  Location: [facility] ENDOSCOPY;  Service: Endoscopy;  Laterality: N/A;   ABDOMINAL HYSTERECTOMY     due to endometriosis   ESOPHAGEAL MANOMETRY N/A 01/06/2016   Procedure: ESOPHAGEAL MANOMETRY (EM);  Surgeon: Rogelia Copping, MD;  Location: [facility] ENDOSCOPY;  Service: Endoscopy;  Laterality: N/A;   ESOPHAGOGASTRODUODENOSCOPY     ESOPHAGOGASTRODUODENOSCOPY (EGD) WITH PROPOFOL  N/A 12/02/2015   Procedure: ESOPHAGOGASTRODUODENOSCOPY (EGD) WITH PROPOFOL ;  Surgeon: Lamar ONEIDA Holmes, MD;  Location: [facility]  ENDOSCOPY;  Service: Endoscopy;  Laterality: N/A;   FOOT SURGERY Left 04/2018   NISSEN FUNDOPLICATION  2007   dr. claudene    Family History  Problem Relation Age of Onset   Hypertension Mother    Heart failure Mother        Defib & pacemaker implant    Heart disease Mother    Diabetes Mother    Hypertension Father    Healthy Brother    Healthy Brother    Breast cancer Neg Hx     Social History   Socioeconomic History   Marital status: Married    Spouse name: Coco Sharpnack   Number of children: 3   Years of education: GED   Highest education level: Not on file  Occupational History    Employer: [employer]  Tobacco Use   Smoking status: Never   Smokeless tobacco: Never  Vaping Use   Vaping status: Never Used  Substance and Sexual Activity   Alcohol use: Yes    Alcohol/week: 0.0 standard drinks of alcohol    Comment: occasional   Drug use: No   Sexual activity: Yes  Other Topics Concern   Not on file  Social History Narrative   Not on file   Social Drivers of Health   Financial Resource Strain: Low Risk  (03/31/2023)   Received [facility]   Overall Financial Resource Strain (CARDIA)    Difficulty of Paying Living Expenses: Not hard at all  Food Insecurity:  No Food Insecurity (03/31/2023)   Received from [facility]   Hunger Vital Sign    Within the past 12 months, you worried that your food would run out before you got the money to buy more.: Never true    Within the past 12 months, the food you bought just didn't last and you didn't have money to get more.: Never true  Transportation Needs: No Transportation Needs (03/31/2023)   Received from [facility] - Transportation    In the past 12 months, has lack of transportation kept you from medical appointments or from getting medications?: No    Lack of Transportation (Non-Medical): No  Physical Activity: Not on file  Stress: Not on file  Social  Connections: Not on file  Intimate Partner Violence: Not on file    Outpatient Medications Prior to Visit  Medication Sig Dispense Refill   EPINEPHrine 0.3 mg/0.3 mL IJ SOAJ injection Inject 0.3 mLs into the muscle as needed.     fluticasone  (FLONASE ) 50 MCG/ACT nasal spray Place 2 sprays into both nostrils daily. 16 g 11   LINZESS 145 MCG CAPS capsule Take 145 mcg by mouth daily.     ondansetron  (ZOFRAN ) 4 MG tablet Take 1 tablet (4 mg total) by mouth every 8 (eight) hours as needed. 20 tablet 5   pantoprazole  (PROTONIX ) 40 MG tablet Take 40 mg by mouth 2 (two) times daily before a meal.     SYNTHROID  50 MCG tablet TK 1 T PO QD  6   estradiol  (ESTRACE ) 1 MG tablet Take 1 tablet by mouth once daily 30 tablet 0   hydrocortisone  2.5 % ointment Apply topically 2 (two) times daily. Do not use for more than 1-2 weeks at a time. (Patient not taking: Reported on 11/21/2023) 30 g 0   PARoxetine  (PAXIL ) 10 MG tablet Take 1 tablet (10 mg total) by mouth daily. (Patient not taking: Reported on 11/21/2023) 30 tablet 1   traZODone  (DESYREL ) 50 MG tablet Take 1-2 tablets (50-100 mg total) by mouth at bedtime as needed for sleep. 180 tablet 1   erythromycin  ophthalmic ointment Place a 1/2 inch ribbon of ointment into the lower eyelid. Use 3 times a day for 7 days. (Patient not taking: Reported on 11/21/2023) 3.5 g 0   No facility-administered medications prior to visit.      ROS:  Review of Systems  Constitutional: Negative.   HENT: Negative.    Eyes: Negative.   Respiratory: Negative.    Cardiovascular: Negative.   Gastrointestinal: Negative.   Endocrine: Negative.   Genitourinary: Negative.   Musculoskeletal: Negative.   Skin: Negative.   Allergic/Immunologic: Negative.   Neurological: Negative.   Hematological: Negative.      OBJECTIVE:   Vitals:  BP (!) 145/57 (BP Location: Right Arm, Patient Position: Sitting, Cuff Size: Normal)   Pulse (!) 51   Ht 5' 4 (1.626 m)   Wt 58.3 kg   BMI  22.07 kg/m   Physical Exam Constitutional:      Appearance: Normal appearance.  HENT:     Head: Normocephalic.     Nose: Nose normal.     Mouth/Throat:     Mouth: Mucous membranes are moist.     Pharynx: Oropharynx is clear.  Eyes:     Pupils: Pupils are equal, round, and reactive to light.  Cardiovascular:     Rate and Rhythm: Normal rate and regular rhythm.     Pulses: Normal pulses.  Heart sounds: Normal heart sounds.  Pulmonary:     Effort: Pulmonary effort is normal.     Breath sounds: Normal breath sounds.  Chest:  Breasts:    Right: Normal.     Left: Normal.  Abdominal:     General: Abdomen is flat.     Palpations: Abdomen is soft.  Genitourinary:    General: Normal vulva.     Rectum: Normal.     Comments: Bimanual exam: no adnexal masses, non tender, not enlarged. Some tone.  Musculoskeletal:        General: Normal range of motion.     Cervical back: Normal range of motion.  Skin:    General: Skin is warm and dry.  Neurological:     Mental Status: She is alert and oriented to person, place, and time.  Psychiatric:        Mood and Affect: Mood normal.        Behavior: Behavior normal.     Results: No results found for this or any previous visit (from the past 24 hours).   Assessment/Plan: Well woman exam - Plan: Cytology - PAP  Menopausal symptom - Plan: estradiol  (ESTRACE ) 1 MG tablet  Cervical cancer screening - Plan: Cytology - PAP    Meds ordered this encounter  Medications   estradiol  (ESTRACE ) 1 MG tablet    Sig: Take 1 tablet (1 mg total) by mouth daily.    Dispense:  30 tablet    Refill:  11     Ambar Montero-Diaz, Student-MidWife 11/21/2023 9:24 AM

## 2023-11-28 ENCOUNTER — Ambulatory Visit: Payer: Self-pay | Admitting: Licensed Practical Nurse

## 2023-11-28 LAB — CYTOLOGY - PAP
Adequacy: ABSENT
Comment: NEGATIVE
Comment: NEGATIVE
Comment: NEGATIVE
Diagnosis: NEGATIVE
HPV 16: NEGATIVE
HPV 18 / 45: NEGATIVE
High risk HPV: POSITIVE — AB

## 2023-12-08 ENCOUNTER — Other Ambulatory Visit: Payer: Self-pay | Admitting: Licensed Practical Nurse

## 2023-12-08 DIAGNOSIS — N951 Menopausal and female climacteric states: Secondary | ICD-10-CM

## 2024-01-11 ENCOUNTER — Other Ambulatory Visit: Payer: Self-pay | Admitting: Licensed Practical Nurse

## 2024-01-11 DIAGNOSIS — N951 Menopausal and female climacteric states: Secondary | ICD-10-CM

## 2024-01-18 NOTE — Telephone Encounter (Signed)
 Chart reviewed. A full year rx was sent to [facility] on 11/21/23. That rx has been d/c and patient is now getting rx's at [facility]. Last rx sent as 30 supply only with no refills. Rx approved #[id number] with 10 refills.

## 2024-05-27 ENCOUNTER — Telehealth: Payer: Self-pay

## 2024-05-27 ENCOUNTER — Telehealth: Payer: Self-pay | Admitting: Licensed Practical Nurse

## 2024-05-27 NOTE — Telephone Encounter (Signed)
 HPV information sent to patient via my chart. Message to [facility] for review.

## 2024-05-27 NOTE — Telephone Encounter (Signed)
 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 She's requesting a return call with explanation. She's thinking about coming off this medicine.

## 2024-05-27 NOTE — Telephone Encounter (Signed)
 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  Pt verbalizes understanding.  Will schedule annual exam Jinnie Cookey, CNM  [facility] 05/28/2023 5:18 PM
